# Patient Record
Sex: Male | Born: 1980
Health system: Southern US, Community
[De-identification: ages and names within clinical notes are randomized; demographics above are authoritative.]

## PROBLEM LIST (undated history)

## (undated) DIAGNOSIS — K760 Fatty (change of) liver, not elsewhere classified: Secondary | ICD-10-CM

## (undated) DIAGNOSIS — A481 Legionnaires' disease: Secondary | ICD-10-CM

## (undated) HISTORY — DX: Fatty (change of) liver, not elsewhere classified: K76.0

## (undated) HISTORY — PX: HERNIA REPAIR: SHX51

## (undated) HISTORY — PX: KNEE SURGERY: SHX244

## (undated) HISTORY — DX: Legionnaires' disease: A48.1

## (undated) HISTORY — PX: KNEE ARTHROSCOPY: SUR90

---

## 2003-08-24 ENCOUNTER — Ambulatory Visit (HOSPITAL_COMMUNITY): Admission: RE | Admit: 2003-08-24 | Discharge: 2003-08-24 | Payer: Self-pay | Admitting: Specialist

## 2007-07-08 ENCOUNTER — Ambulatory Visit (HOSPITAL_BASED_OUTPATIENT_CLINIC_OR_DEPARTMENT_OTHER): Admission: RE | Admit: 2007-07-08 | Discharge: 2007-07-08 | Payer: Self-pay | Admitting: General Surgery

## 2008-07-08 ENCOUNTER — Emergency Department (HOSPITAL_COMMUNITY): Admission: EM | Admit: 2008-07-08 | Discharge: 2008-07-09 | Payer: Self-pay | Admitting: Emergency Medicine

## 2008-07-11 ENCOUNTER — Inpatient Hospital Stay (HOSPITAL_COMMUNITY): Admission: EM | Admit: 2008-07-11 | Discharge: 2008-07-16 | Payer: Self-pay | Admitting: Emergency Medicine

## 2009-10-22 ENCOUNTER — Ambulatory Visit (HOSPITAL_COMMUNITY): Admission: RE | Admit: 2009-10-22 | Discharge: 2009-10-22 | Payer: Self-pay | Admitting: General Surgery

## 2010-10-09 LAB — CBC
HCT: 46.8 % (ref 39.0–52.0)
Hemoglobin: 16.2 g/dL (ref 13.0–17.0)
MCV: 94.3 fL (ref 78.0–100.0)
Platelets: 168 10*3/uL (ref 150–400)
RDW: 12.4 % (ref 11.5–15.5)

## 2010-12-03 NOTE — Discharge Summary (Signed)
NAMENICOLA, [name] NO.:  [id number]   MEDICAL RECORD NO.:  [id number]          PATIENT TYPE:  INP   LOCATION:  1513                         FACILITY:  [facility]   PHYSICIAN:  Theodosia Paling, MD    DATE OF BIRTH:  Mar 17, 1981   DATE OF ADMISSION:  07/11/2008  DATE OF DISCHARGE:  07/16/2008                               DISCHARGE SUMMARY   PRIMARY CARE PHYSICIAN:  The patient followed at [facility] in [place].   ADMITTING HISTORY:  Please refer to the excellent admission note  dictated by Dr. Marthann Schiller under the history of present illness.   DISCHARGE DIAGNOSES:  1. Atypical pneumonia.  2. Pancytopenia most likely secondary to pneumonia, likely legionella      or mycoplasma.  3. Transaminitis, most likely secondary to recurring pneumonia, likely      legionella or mycoplasma.  4. Bradycardia, which later on resolved, most likely secondary to #1      again.  5. Diarrhea, most likely secondary to #1 again, which has resolved as      well with treatment.   DISCHARGE MEDICATIONS:  Moxifloxacin 400 mg p.o. daily for 10 days.   HOSPITAL COURSE:  The following issues were addressed during the  hospitalization.  1. Atypical pneumonia.  The patient was initially started on IV      moxifloxacin and IV vancomycin was added later on.  I stopped IV      vancomycin given that he had very bad pneumonia associated with      hypoxia and pancytopenia.  Still, he had relative bradycardia.  An      LDH was ordered, which was elevated.  The patient had GI symptoms      related to pancytopenia, as well as transaminitis.  Legionella      antigen was ordered, pending at this time, potential diagnosis of      atypical pneumonia was made.  The patient received or and IV      vancomycin.  The patient had an excellent response to IV      moxifloxacin completing 14 days course of moxifloxacin and at the      time of discharge he was stable, satting 96% on  room air.  He will      be taking 1 more week at home while he recovers from his illness,      as well as he is hemodynamically stable and his sed rate is      improved.  2. Transaminitis.  The patient had elevation of AST and ALT, which      subsequently was trending down, most likely secondary to atypical      pneumonia likely secondary to legionella pneumonia, viral      serologies were otherwise negative.  Right upper quadrant      ultrasound was ordered, which was negative.  3. Pancytopenia, which again was all through the hospitalization,      again most likely to atypical pneumonia/from legionella pneumonia      or mycoplasma pneumonia.  4. Diarrhea, most likely exacerbated symptoms of legionella pneumonia,  and over the course, improved.   IMAGING PERFORMED:  Chest x-ray done on July 11, 2008 showing patchy  bibasilar airspace disease, compatible with pneumonia.  July 16, 2008 showing increase in bilateral pneumonia.  Ultrasound of the abdomen  on July 15, 2008 showing fatty liver, otherwise negative.   CONSULTATIONS PERFORMED:  None.   PROCEDURES PERFORMED:  None.   DISPOSITION:  The patient is going to follow up with [facility] and he will obtain a followup chest x-ray in 3 weeks and the  chest x-ray has slightly worsened since first chest x-ray, though the  patient has clinically improved.  This could again be compatible with  legionella pneumonia.  The clinical improvement happens and the chest x-  ray gets worse.  The patient is recommended to report back to the  emergency room in case he has any respiratory distress.   Total time spent in discharge of the patient around 1 hour and 15  minutes.      Theodosia Paling, MD  Electronically Signed     NP/MEDQ  D:  07/16/2008  T:  07/16/2008  Job:  [id number]   cc:   [facility]

## 2010-12-03 NOTE — H&P (Signed)
NAMEPHIL, MICHELS NO.:  [id number]   MEDICAL RECORD NO.:  [id number]          PATIENT TYPE:  INP   LOCATION:  0105                         [facility]ACILITY[facility]  [facility]   PHYSICIAN:  Altha Harm, MDDATE OF BIRTH:  1981/01/05   DATE OF ADMISSION:  07/11/2008  DATE OF DISCHARGE:                              HISTORY & PHYSICAL   CHIEF COMPLAINT:  Diagnosed with pneumonia which failed outpatient  treatment.   HISTORY OF PRESENT ILLNESS:  This is a 30 year old gentleman who was  diagnosed with pneumonia approximately 4 days ago.  The patient was  given a prescription for Avelox and Zofran.  He went home and continued  to have nausea and vomiting.  He is not sure how many doses of the  Avelox he was able to keep down.  The patient continued to have general  malaise, weakness, vomiting and a T max of 102.5.  He has had a cough  productive of yellowish sputum and he has had two episodes of diarrhea.  The patient does admit that he had some sick contacts at work, however,  he is not sure if anyone has had the flu.  The patient is usually a very  healthy gentleman with no chronic illnesses.  We are asked to see the  patient today for admission for failed outpatient treatment of community-  acquired pneumonia in addition to dehydration, nausea and vomiting.   PAST MEDICAL HISTORY:  According to the patient, he has no chronic  medical problems and is generally healthy.   FAMILY HISTORY:  Has been reviewed and there are no chronic illnesses  that run in the immediate family.  He does have a grandmother with  hypertension.   SOCIAL HISTORY:  The patient works at a desk job.  He has the support of  his family.  His father is here at the bedside with him.  There is no  tobacco, alcohol or drug use.   CURRENT MEDICATIONS:  1. Avelox 400 mg p.o. daily.  2. Zofran p.r.n.   ALLERGIES:  1. KEFLEX.  2. PENICILLIN.  3. SULFA.   PRIMARY CARE PHYSICIAN:  The patient is  unassigned.   REVIEW OF SYSTEMS:  The patient denies any dizziness, any loss of  consciousness, any seizure activity or headache.  He denies any blurring  of vision.  He denies any eye pain or discharge from his eyes.  He  denies any sinus tenderness or any nasal drip.  He denies any oral pain  or difficulty chewing.  The patient denies any neck pain or difficulty  swallowing.  He denies any chest pain.  In terms of respiratory, please  see the HPI.   He denies any palpitations.  He denies any abdominal pain or  constipation.  Note, the patient does admit to diarrhea.  He denies any  dysuria, frequency or urgency.  He denies any weight loss or weight  gain.  As noted in the HPI, the patient has been having fever and  chills.  He denies any weakness focally.  The patient does admit to  general malaise.  He  denies any back pain, but does admit to general  myalgias.  Otherwise all systems are negative except as noted in the  HPI.   His evaluation done in the emergency room shows a white blood cell count  of 3.2, hemoglobin of 14.6, hematocrit of 41 and platelet count of 139.  Sodium 136, potassium 3.2, chloride 103, bicarb 23, BUN 9, creatinine  0.8.  A chest x-ray shows patchy bilateral infiltrates consistent with  the bilateral pneumonia.   PHYSICAL EXAMINATION:  GENERAL:  The patient in general is toxic-  appearing.  He is laying in bed in distress secondary to toxicity.  VITAL SIGNS:  Temperature is 98.4, heart rate 104, blood pressure  135/87, respiratory rate is 20 with an O2 sat of 96% on 2 liters.  HEENT:  The patient is normocephalic, atraumatic.  Pupils are equally  round and reactive to light and accommodation.  Extraocular movements  are intact.  Oropharynx is dry.  There is no exudate, erythema or  lesions noted.  Nasal mucosa is boggy, no polyps noted.  Tympanic  membranes are translucent bilaterally with good landmarks.  NECK:  Trachea is midline.  No masses, no  thyromegaly, no JVD, no  carotid bruit.  RESPIRATORY:  The patient has normal respiratory effort.  He does have  some mild accessory muscle use.  No wheezing or rhonchi noted.  The  patient has good air entry.  CARDIOVASCULAR:  He is tachycardic.  There is a normal S1-S2.  No  murmurs, rubs or gallops are noted.  PMI is nondisplaced.  No heaves or  thrills on palpation.  ABDOMEN:  The patient has an obese abdomen.  Soft, nontender,  nondistended.  No masses or hepatosplenomegaly.  LYMPH NODES:  No cervical or axillary inguinal lymphadenopathy.  NEUROLOGICAL:  He has got strength of at least 4+/5, although the  patient is generally weak secondary to his illness.  DTRs are 2+  bilaterally in upper and lower extremities.  Sensation is intact to  light touch, pinprick and proprioception.  He has got no focal  neurological deficits and cranial nerves II-XII are grossly intact.  MUSCULOSKELETAL:  He has got no warmth, swelling or erythema around the  joints.  He has no spinal tenderness.  PSYCHIATRIC:  The patient is alert and oriented x3.  He has good recent  and remote recall, good insight and cognition.   ASSESSMENT:  This a patient who presents;  1. Bilateral community-acquired pneumonia.  2. Failed outpatient treatment.  3. Sepsis.  4. Dehydration.  5. Leukopenia.  6. Thrombocytopenia.  7. Hypokalemia.  8. Hypocalcemia.   PLAN:  The patient will be started on aggressive IV hydration, given IV  Avelox.  I will complete his electrolytes and check his blood cultures  in addition to a sputum culture.  The antibiotics will be tapered based  upon the results of cultures.  The patient will be observed closely over  the next 24 hours with vital signs q.4 hours.  Currently, the patient is  hemodynamically stable.  Although he does have features of sepsis, I do  not believe the patient needs ICU  or telemetry monitoring at this time.  The patient will be admitted to a  med-surg bed with  vitals q.4 hours, supplemental oxygen to maintain his  O2 sats greater than 93% and the plan as noted above.  Further decisions  with treatment will be made according to the patient's initial response  to treatment and initial laboratory studies.  Altha Harm, MD  Electronically Signed     MAM/MEDQ  D:  07/11/2008  T:  07/11/2008  Job:  [id number]

## 2010-12-03 NOTE — Op Note (Signed)
NAMEVERBON, GIANGREGORIO              ACCOUNT NO.:  [id number]   MEDICAL RECORD NO.:  [id number]          PATIENT TYPE:  AMB   LOCATION:  DSC                          FACILITY:  [facility]   PHYSICIAN:  Leonie Man, M.D.   DATE OF BIRTH:  April 03, 1981   DATE OF PROCEDURE:  07/08/2007  DATE OF DISCHARGE:                               OPERATIVE REPORT   PREOPERATIVE DIAGNOSIS:  Left inguinal hernia.   POSTOPERATIVE DIAGNOSIS:  Left direct inguinal hernia.   PROCEDURE:  Repair left inguinal hernia using polypropylene mesh.   SURGEON:  Leonie Man, M.D.   ASSISTANT:  OR nurse.   ANESTHESIA:  General.   OPERATIVE FINDINGS:  Left-sided direct inguinal hernia.  No specimens to  lab.  Estimated blood loss was minimal.  No complications noted during  the course of this operation.   INDICATIONS:  The patient is a 30 year old man presenting with a large  left-sided inguinal hernia which was essentially asymptomatic but  presents as a bulge within his groin, on exertion or coughing.  He comes  to the operating room now after the risks and potential benefits of  surgery have been fully discussed.  All questions answered, consent  obtained.   PROCEDURE:  The patient is positioned supinely.  Following into the  induction of satisfactory general anesthesia, the left groin is prepped  and draped to be included in a sterile operative field.  Positive  identification of the patient as Armond Cuthrell is made and a transverse  incision is made in the left lower abdominal crease deepened through  skin and subcutaneous tissue dissecting down to the external oblique  aponeurosis.  The external oblique aponeurosis opened up through the  external inguinal ring with protection of the ilioinguinal nerve which  was retracted laterally and inferiorly.  The spermatic cord is elevated  and held with a Penrose drain.  Dissection within the spermatic cord did  not reveal an indirect hernia.  A large direct  hernia was noted and this  was dissected free and reduced back into the retroperitoneum.  The  hernia was repaired with an onlay patch of polypropylene mesh which was  fashioned from the region of the wound.  This was sewn in at the pubic  tubercle with 2-0 Novofil suture which was carried up along the  conjoined tendon to the internal ring and again from the pubic tubercle,  carried up along the shelving edge of Poupart's ligament to the internal  ring.  The mesh was split and the spermatic cord was brought out through  the leaflets of the split mesh.  The tails of the mesh were sutured down  into the internal oblique muscle behind the cord.  The repair was noted  to be intact.  All areas of dissection checked for hemostasis noted to  be dry.  Sponge and instrument counts are verified and the wounds closed  in layers as follows after additional injections of Marcaine were made  for additional analgesia.  The external oblique aponeurosis was closed  over the cord with running 2-0 Vicryl suture and this reapproximated the  external  ring.  Scarpa fascia was closed with running 3-0 Vicryl suture  and  skin was closed with running subdermal 4-0 Monocryl suture and then  reinforced with Steri-Strips.  Sterile dressings applied.  The  anesthetic reversed and the patient removed from the operating room to  the recovery room in stable condition.  He tolerated the procedure well.      Leonie Man, M.D.  Electronically Signed     PB/MEDQ  D:  07/08/2007  T:  07/08/2007  Job:  [id number]

## 2011-04-25 LAB — BASIC METABOLIC PANEL
BUN: 2 mg/dL — ABNORMAL LOW (ref 6–23)
BUN: 9 mg/dL (ref 6–23)
Calcium: 7.9 mg/dL — ABNORMAL LOW (ref 8.4–10.5)
Creatinine, Ser: 0.83 mg/dL (ref 0.4–1.5)
GFR calc Af Amer: >60 mL/min (ref >60)
GFR calc Af Amer: >60 mL/min (ref >60)
GFR calc non Af Amer: >60 mL/min (ref >60)
GFR calc non Af Amer: >60 mL/min (ref >60)
GFR calc non Af Amer: >60 mL/min (ref >60)
Glucose, Bld: 132 mg/dL — ABNORMAL HIGH (ref 70–99)
Glucose, Bld: 95 mg/dL (ref 70–99)
Potassium: 3.2 mEq/L — ABNORMAL LOW (ref 3.5–5.1)
Potassium: 3.5 mEq/L (ref 3.5–5.1)
Sodium: 139 mEq/L (ref 135–145)
Sodium: 141 mEq/L (ref 135–145)

## 2011-04-25 LAB — CBC
HCT: 40.2 % (ref 39.0–52.0)
HCT: 41.9 % (ref 39.0–52.0)
Hemoglobin: 13.6 g/dL (ref 13.0–17.0)
Hemoglobin: 13.8 g/dL (ref 13.0–17.0)
MCHC: 34.3 g/dL (ref 30.0–36.0)
MCHC: 34.4 g/dL (ref 30.0–36.0)
MCHC: 34.5 g/dL (ref 30.0–36.0)
MCV: 91.2 fL (ref 78.0–100.0)
MCV: 92.3 fL (ref 78.0–100.0)
MCV: 92.6 fL (ref 78.0–100.0)
Platelets: 136 10*3/uL — ABNORMAL LOW (ref 150–400)
Platelets: 139 10*3/uL — ABNORMAL LOW (ref 150–400)
Platelets: 143 10*3/uL — ABNORMAL LOW (ref 150–400)
Platelets: 187 10*3/uL (ref 150–400)
RBC: 4.29 MIL/uL (ref 4.22–5.81)
RBC: 4.41 MIL/uL (ref 4.22–5.81)
RDW: 12.4 % (ref 11.5–15.5)
RDW: 12.9 % (ref 11.5–15.5)
RDW: 13 % (ref 11.5–15.5)
WBC: 3.2 10*3/uL — ABNORMAL LOW (ref 4.0–10.5)
WBC: 4.1 10*3/uL (ref 4.0–10.5)
WBC: 5.6 10*3/uL (ref 4.0–10.5)

## 2011-04-25 LAB — LEGIONELLA ANTIGEN, URINE: Legionella Antigen, Urine: NEGATIVE

## 2011-04-25 LAB — DIFFERENTIAL
Basophils Absolute: 0 10*3/uL (ref 0.0–0.1)
Basophils Absolute: 0 10*3/uL (ref 0.0–0.1)
Basophils Absolute: 0 10*3/uL (ref 0.0–0.1)
Basophils Relative: 0 % (ref 0–1)
Eosinophils Absolute: 0 10*3/uL (ref 0.0–0.7)
Eosinophils Absolute: 0 10*3/uL (ref 0.0–0.7)
Eosinophils Relative: 0 % (ref 0–5)
Eosinophils Relative: 0 % (ref 0–5)
Lymphocytes Relative: 36 % (ref 12–46)
Lymphocytes Relative: 41 % (ref 12–46)
Monocytes Absolute: 0.4 10*3/uL (ref 0.1–1.0)
Monocytes Absolute: 0.5 10*3/uL (ref 0.1–1.0)
Neutro Abs: 1.9 10*3/uL (ref 1.7–7.7)
Neutrophils Relative %: 55 % (ref 43–77)

## 2011-04-25 LAB — COMPREHENSIVE METABOLIC PANEL
ALT: 98 U/L — ABNORMAL HIGH (ref 0–53)
AST: 113 U/L — ABNORMAL HIGH (ref 0–37)
AST: 39 U/L — ABNORMAL HIGH (ref 0–37)
Albumin: 3.1 g/dL — ABNORMAL LOW (ref 3.5–5.2)
CO2: 24 mEq/L (ref 19–32)
CO2: 27 mEq/L (ref 19–32)
Calcium: 8.2 mg/dL — ABNORMAL LOW (ref 8.4–10.5)
Calcium: 8.3 mg/dL — ABNORMAL LOW (ref 8.4–10.5)
Chloride: 109 mEq/L (ref 96–112)
Creatinine, Ser: 0.68 mg/dL (ref 0.4–1.5)
Creatinine, Ser: 0.77 mg/dL (ref 0.4–1.5)
GFR calc Af Amer: >60 mL/min (ref >60)
GFR calc Af Amer: >60 mL/min (ref >60)
GFR calc Af Amer: >60 mL/min (ref >60)
GFR calc non Af Amer: >60 mL/min (ref >60)
GFR calc non Af Amer: >60 mL/min (ref >60)
Glucose, Bld: 112 mg/dL — ABNORMAL HIGH (ref 70–99)
Sodium: 139 mEq/L (ref 135–145)
Total Bilirubin: 1.2 mg/dL (ref 0.3–1.2)

## 2011-04-25 LAB — HEPATITIS PANEL, ACUTE
HCV Ab: NEGATIVE
Hep B C IgM: NEGATIVE

## 2011-04-25 LAB — MAGNESIUM: Magnesium: 2 mg/dL (ref 1.5–2.5)

## 2011-04-25 LAB — PHOSPHORUS: Phosphorus: 1.7 mg/dL — ABNORMAL LOW (ref 2.3–4.6)

## 2011-04-25 LAB — CULTURE, BLOOD (ROUTINE X 2): Culture: NO GROWTH

## 2011-04-25 LAB — CULTURE, RESPIRATORY W GRAM STAIN

## 2011-04-25 LAB — PROTIME-INR: Prothrombin Time: 13.4 seconds (ref 11.6–15.2)

## 2015-05-25 ENCOUNTER — Ambulatory Visit (INDEPENDENT_AMBULATORY_CARE_PROVIDER_SITE_OTHER): Payer: BLUE CROSS/BLUE SHIELD | Admitting: Family Medicine

## 2015-05-25 ENCOUNTER — Encounter: Payer: Self-pay | Admitting: Family Medicine

## 2015-05-25 VITALS — BP 128/80 | HR 68 | Temp 98.4°F | Resp 18 | Ht 74.0 in | Wt 240.0 lb

## 2015-05-25 DIAGNOSIS — Z0000 Encounter for general adult medical examination without abnormal findings: Secondary | ICD-10-CM | POA: Diagnosis not present

## 2015-05-25 NOTE — Progress Notes (Signed)
Subjective:    Patient ID: [name], male    DOB: 15-Aug-1980, 34 y.o.   MRN: [id number]  HPI Patient is in today for complete physical exam. He is scheduled for knee surgery under the care of Dr. Eulah PontMurphy in December. He has been relatively inactive for the last 2 months due to bilateral knee pain. He has gained 20 pounds over the last few months. He is aware of this and he is trying to consume a 1500-calorie a day diet. At the present time he is unable to exercise. Outside of that he has no medical concerns. Past Medical History  Diagnosis Date  . Legionella pneumonia (HCC)     2009   Past Surgical History  Procedure Laterality Date  . Hernia repair     No current outpatient prescriptions on file prior to visit.   No current facility-administered medications on file prior to visit.   Allergies  Allergen Reactions  . Keflex [Cephalexin]   . Penicillins   . Sulfa Antibiotics    Social History   Social History  . Marital Status: Married    Spouse Name: N/A  . Number of Children: N/A  . Years of Education: N/A   Occupational History  . Not on file.   Social History Main Topics  . Smoking status: Never Smoker   . Smokeless tobacco: Never Used  . Alcohol Use: No  . Drug Use: No  . Sexual Activity: Yes     Comment: married, works at [employer]   Other Topics Concern  . Not on file   Social History Narrative  . No narrative on file   Family History  Problem Relation Age of Onset  . Diabetes Father   . Cancer Paternal Grandfather     oral cancer  . Mental illness Mother       Review of Systems  All other systems reviewed and are negative.      Objective:   Physical Exam  Constitutional: He is oriented to person, place, and time. He appears well-developed and well-nourished. No distress.  HENT:  Head: Normocephalic and atraumatic.  Right Ear: External ear normal.  Left Ear: External ear normal.  Nose: Nose normal.  Mouth/Throat: Oropharynx is clear and  moist. No oropharyngeal exudate.  Eyes: Conjunctivae and EOM are normal. Pupils are equal, round, and reactive to light. Right eye exhibits no discharge. Left eye exhibits no discharge. No scleral icterus.  Neck: Normal range of motion. Neck supple. No JVD present. No tracheal deviation present. No thyromegaly present.  Cardiovascular: Normal rate, regular rhythm, normal heart sounds and intact distal pulses.  Exam reveals no gallop and no friction rub.   No murmur heard. Pulmonary/Chest: Effort normal and breath sounds normal. No stridor. No respiratory distress. He has no wheezes. He has no rales. He exhibits no tenderness.  Abdominal: Soft. Bowel sounds are normal. He exhibits no distension and no mass. There is no tenderness. There is no rebound and no guarding. Hernia confirmed negative in the right inguinal area and confirmed negative in the left inguinal area.  Genitourinary: Testes normal and penis normal.  Musculoskeletal: He exhibits no edema.       Right knee: He exhibits decreased range of motion. Tenderness found. Medial joint line and lateral joint line tenderness noted.       Left knee: He exhibits decreased range of motion. Tenderness found. Medial joint line and lateral joint line tenderness noted.  Lymphadenopathy:    He has no cervical adenopathy.  Right: No inguinal adenopathy present.       Left: No inguinal adenopathy present.  Neurological: He is alert and oriented to person, place, and time. He has normal reflexes. He displays normal reflexes. No cranial nerve deficit. He exhibits normal muscle tone. Coordination normal.  Skin: Skin is warm. No rash noted. He is not diaphoretic. No erythema. No pallor.  Psychiatric: He has a normal mood and affect. His behavior is normal. Judgment and thought content normal.  Vitals reviewed.         Assessment & Plan:  Routine general medical examination at a health care facility  I have recommended diet and exercise once his  knees will allow to facilitate weight loss. I would like to see his weight below 220 with an ultimate goal of less than 200 pounds. I will also check a CBC, CMP, fasting lipid panel. His blood pressure is excellent. He had his flu shot at work. The remainder of his physical exam is normal

## 2015-06-29 ENCOUNTER — Telehealth: Payer: Self-pay | Admitting: Family Medicine

## 2015-06-29 NOTE — Telephone Encounter (Signed)
Patient left a message stating that he would like to speak with Dr. Caren MacadamPickard's nurse again regarding his labs. [phone]

## 2015-06-29 NOTE — Telephone Encounter (Signed)
Pt called and questions answered

## 2015-06-29 NOTE — Telephone Encounter (Signed)
WTP reviewed blood work results that were faxed to us from [facility] and Dr. Tanya NonesPickard states that pt has mild elevation in ALT likely due to fatty liver disease. Try to lose about 10-15 pounds, avoid ETOH and tylenol and will recheck in 3 months. Pt is aware.

## 2016-08-18 ENCOUNTER — Ambulatory Visit (HOSPITAL_COMMUNITY)
Admission: EM | Admit: 2016-08-18 | Discharge: 2016-08-18 | Disposition: A | Discharge status: Home / Self Care | Payer: BLUE CROSS/BLUE SHIELD | Attending: Family Medicine | Admitting: Family Medicine

## 2016-08-18 ENCOUNTER — Encounter (HOSPITAL_COMMUNITY): Payer: Self-pay | Admitting: Emergency Medicine

## 2016-08-18 DIAGNOSIS — Z833 Family history of diabetes mellitus: Secondary | ICD-10-CM | POA: Insufficient documentation

## 2016-08-18 DIAGNOSIS — K529 Noninfective gastroenteritis and colitis, unspecified: Secondary | ICD-10-CM | POA: Insufficient documentation

## 2016-08-18 DIAGNOSIS — Z880 Allergy status to penicillin: Secondary | ICD-10-CM | POA: Insufficient documentation

## 2016-08-18 DIAGNOSIS — Z818 Family history of other mental and behavioral disorders: Secondary | ICD-10-CM | POA: Diagnosis not present

## 2016-08-18 DIAGNOSIS — Z881 Allergy status to other antibiotic agents status: Secondary | ICD-10-CM | POA: Insufficient documentation

## 2016-08-18 DIAGNOSIS — Z882 Allergy status to sulfonamides status: Secondary | ICD-10-CM | POA: Insufficient documentation

## 2016-08-18 DIAGNOSIS — R109 Unspecified abdominal pain: Secondary | ICD-10-CM | POA: Diagnosis present

## 2016-08-18 MED ORDER — SODIUM CHLORIDE 0.9 % IV BOLUS (SEPSIS)
1000.0000 mL | Freq: Once | INTRAVENOUS | Status: AC
  Administered 2016-08-18: 1000 mL via INTRAVENOUS

## 2016-08-18 MED ORDER — ONDANSETRON 8 MG PO TBDP: 8.0000 mg | ORAL_TABLET | Freq: Three times a day (TID) | ORAL | 0 refills | Status: DC | PRN

## 2016-08-18 MED ORDER — ONDANSETRON 4 MG PO TBDP
ORAL_TABLET | ORAL | Status: AC
  Filled 2016-08-18: qty 1

## 2016-08-18 MED ORDER — ONDANSETRON 4 MG PO TBDP
4.0000 mg | ORAL_TABLET | Freq: Once | ORAL | Status: AC
  Administered 2016-08-18: 4 mg via ORAL

## 2016-08-18 NOTE — ED Notes (Signed)
Bed: UC08 Expected date:  Expected time:  Means of arrival:  Comments: 

## 2016-08-18 NOTE — ED Triage Notes (Signed)
[facility] a [facility] of abdominal cramps with N/V/D that started after eating a frozen pizza 2 days ago.

## 2016-08-18 NOTE — ED Provider Notes (Addendum)
[facility]    CSN: [id number] Arrival date & time: 08/18/16  1420     History   Chief Complaint Chief Complaint  Patient presents with  . Abdominal Cramping    HPI [name] is a 36 y.o. male.   This is a 36 year old man who [facility] for evaluation of nausea, vomiting, and diarrhea. He thinks this may be related to eating a frozen pizza 2 days ago.  Patient began with nausea and vomiting early Saturday morning about 36 hours ago. He's continued to have intermittent nausea and vomiting and diarrhea. Was recently he vomited about 3 hours ago and he thought there was some blood in his emesis. He's had no blood in his diarrhea, last episode being about 3:30.  Patient's having some cramps but no focal continuous pain.      Past Medical History:  Diagnosis Date  . 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   Past Surgical History:  Procedure Laterality Date  . HERNIA REPAIR         Home Medications    Prior to Admission medications   Medication Sig Start Date End Date Taking? Authorizing Provider  ondansetron (ZOFRAN-ODT) 8 MG disintegrating tablet Take 1 tablet (8 mg total) by mouth every 8 (eight) hours as needed for nausea. 08/18/16   Elvina SidleKurt Beatric Fulop, MD    Family History Family History  Problem Relation Age of Onset  . Diabetes Father   . Cancer Paternal Grandfather     oral cancer  . Mental illness Mother     Social History Social History  Substance Use Topics  . Smoking status: Never Smoker  . Smokeless tobacco: Never Used  . Alcohol use No     Allergies   Keflex [cephalexin]; Penicillins; and Sulfa antibiotics   Review of Systems Review of Systems  Constitutional: Positive for fatigue.  HENT: Negative.   Gastrointestinal: Positive for abdominal pain, diarrhea, nausea and vomiting.  Genitourinary: Negative.     Musculoskeletal: Negative.   Neurological: Negative.      Physical Exam Triage Vital Signs ED Triage Vitals  Enc Vitals Group     BP 08/18/16 1446 123/74     Pulse Rate 08/18/16 1446 (!) 132     Resp 08/18/16 1446 18     Temp 08/18/16 1446 98.8 F (37.1 C)     Temp Source 08/18/16 1446 Oral     SpO2 08/18/16 1446 98 %     Weight --      Height --      Head Circumference --      Peak Flow --      Pain Score 08/18/16 1445 7     Pain Loc --      Pain Edu? --      Excl. in GC? --    Orthostatic VS for the past 24 hrs:  BP- Lying Pulse- Lying BP- Sitting Pulse- Sitting BP- Standing at 0 minutes Pulse- Standing at 0 minutes  08/18/16 1543 131/77 122 106/82 131 107/81 136    Updated Vital Signs BP 123/74 (BP Location: Right Arm)   Pulse (!) 132   Temp 98.8 F (37.1 C) (Oral)   Resp 18   SpO2 98%    Physical Exam  Constitutional: He is oriented to person, place, and time. He appears well-developed and well-nourished.  HENT:  Head: Normocephalic.  Right  Ear: External ear normal.  Left Ear: External ear normal.  Mucous membranes are dry  Eyes: Conjunctivae and EOM are normal. Pupils are equal, round, and reactive to light.  Neck: Normal range of motion. Neck supple. No JVD present.  Cardiovascular: Regular rhythm and normal heart sounds.   Rapid heart rate while lying down at about 1 10 bpm  Pulmonary/Chest: Effort normal and breath sounds normal.  Abdominal: Soft.  Hyperactive bowel sounds Mild tenderness with deep palpation in both lower quadrants  Musculoskeletal: Normal range of motion.  Neurological: He is alert and oriented to person, place, and time.  Skin: Skin is warm and dry.  Nursing note and vitals reviewed.    UC Treatments / Results  Labs (all labs ordered are listed, but only abnormal results are displayed) Labs Reviewed - No data to display  EKG  EKG Interpretation None       Radiology No results found.  Procedures Procedures  (including critical care time)  Medications Ordered in UC Medications  sodium chloride 0.9 % bolus 1,000 mL (1,000 mLs Intravenous Given 08/18/16 1550)  ondansetron (ZOFRAN-ODT) disintegrating tablet 4 mg (4 mg Oral Given 08/18/16 1542)     Initial Impression / Assessment and Plan / UC Course  I have reviewed the triage vital signs and the nursing notes.  Pertinent labs & imaging results that were available during my care of the patient were reviewed by me and considered in my medical decision making (see chart for details).     Final Clinical Impressions(s) / UC Diagnoses   Final diagnoses:  Acute gastroenteritis    New Prescriptions New Prescriptions   ONDANSETRON (ZOFRAN-ODT) 8 MG DISINTEGRATING TABLET    Take 1 tablet (8 mg total) by mouth every 8 (eight) hours as needed for nausea.     Elvina Sidle, MD 08/18/16 1610    Elvina Sidle, MD 08/18/16 1620

## 2016-08-18 NOTE — Discharge Instructions (Signed)
Continue with sips of clear liquids starting around 7:00 tonight. Do not attempt solids until you've kept liquids down for at least several hours.

## 2016-08-18 NOTE — ED Notes (Signed)
1000cc Sodium Chloride 0.9% bolus completed.

## 2016-08-19 ENCOUNTER — Encounter: Payer: Self-pay | Admitting: Family Medicine

## 2016-08-19 ENCOUNTER — Ambulatory Visit (INDEPENDENT_AMBULATORY_CARE_PROVIDER_SITE_OTHER): Payer: BLUE CROSS/BLUE SHIELD | Admitting: Family Medicine

## 2016-08-19 ENCOUNTER — Ambulatory Visit: Payer: BLUE CROSS/BLUE SHIELD | Admitting: Family Medicine

## 2016-08-19 VITALS — BP 120/76 | HR 90 | Temp 98.9°F | Resp 18 | Ht 74.0 in | Wt 235.0 lb

## 2016-08-19 DIAGNOSIS — R8299 Other abnormal findings in urine: Secondary | ICD-10-CM | POA: Diagnosis not present

## 2016-08-19 DIAGNOSIS — K529 Noninfective gastroenteritis and colitis, unspecified: Secondary | ICD-10-CM

## 2016-08-19 DIAGNOSIS — R82998 Other abnormal findings in urine: Secondary | ICD-10-CM

## 2016-08-19 LAB — URINALYSIS, ROUTINE W REFLEX MICROSCOPIC
BILIRUBIN URINE: NEGATIVE
Glucose, UA: NEGATIVE
Ketones, ur: NEGATIVE
Leukocytes, UA: NEGATIVE
Nitrite: NEGATIVE
SPECIFIC GRAVITY, URINE: 1.02 (ref 1.001–1.035)
pH: 6 (ref 5.0–8.0)

## 2016-08-19 LAB — CBC
HCT: 45.8 % (ref 38.5–50.0)
HEMOGLOBIN: 16 g/dL (ref 13.0–17.0)
MCH: 32.3 pg (ref 27.0–33.0)
MCHC: 34.9 g/dL (ref 32.0–36.0)
MCV: 92.5 fL (ref 80.0–100.0)
Platelets: 196 10*3/uL (ref 140–400)
RBC: 4.95 MIL/uL (ref 4.20–5.80)
RDW: 13.1 % (ref 11.0–15.0)
WBC: 10.5 10*3/uL (ref 3.8–10.8)

## 2016-08-19 LAB — COMPREHENSIVE METABOLIC PANEL
ALK PHOS: 57 U/L (ref 40–115)
ALT: 16 U/L (ref 9–46)
AST: 16 U/L (ref 10–40)
Albumin: 3.8 g/dL (ref 3.6–5.1)
BUN: 30 mg/dL — ABNORMAL HIGH (ref 7–25)
CALCIUM: 9.4 mg/dL (ref 8.6–10.3)
CHLORIDE: 100 mmol/L (ref 98–110)
CO2: 23 mmol/L (ref 20–31)
Creat: 1.03 mg/dL (ref 0.60–1.35)
Glucose, Bld: 135 mg/dL — ABNORMAL HIGH (ref 70–99)
POTASSIUM: 3.8 mmol/L (ref 3.5–5.3)
Sodium: 139 mmol/L (ref 135–146)
Total Bilirubin: 2.4 mg/dL — ABNORMAL HIGH (ref 0.2–1.2)
Total Protein: 6.5 g/dL (ref 6.1–8.1)

## 2016-08-19 LAB — URINALYSIS, MICROSCOPIC ONLY
CRYSTALS: NONE SEEN [HPF]
Casts: NONE SEEN [LPF]
Squamous Epithelial / LPF: NONE SEEN [HPF] (ref <=5)
Yeast: NONE SEEN [HPF]

## 2016-08-19 MED ORDER — SUCRALFATE 1 GM/10ML PO SUSP: 1.0000 g | Freq: Two times a day (BID) | ORAL | 0 refills | Status: DC

## 2016-08-19 MED ORDER — PROMETHAZINE HCL 25 MG/ML IJ SOLN
25.0000 mg | Freq: Once | INTRAMUSCULAR | Status: AC
  Administered 2016-08-19: 25 mg via INTRAVENOUS

## 2016-08-19 NOTE — Progress Notes (Signed)
   Subjective:    Patient ID: [name], male    DOB: 08/12/1980, 36 y.o.   MRN: [id number]  Patient presents for Illness (x3 days- GI issues- N/V/D- [facility]  Pt here with Nausea vomiting diarrhea for the past 4 days. He was seen at the urgent care yesterday. His symptoms started after eating pizza and chicken nuggets but he also had exposure to a stomach bug at his office. He has not had any fever. He states that vomiting and cramps was violent with the first 24 hours. He went to the urgent care yesterday they gave him a 1 L bolus of fluid as he was significantly dehydrated he was also given Zofran. He has been taking the Zofran which helped some but he still belches significantly and feels acid coming up causing him to have nausea and vomiting. He's also still having diarrhea. He has not noted any blood in his school. He has not had any fever no cough or congestion no 1 else in his household is sick. He does not take any medications on a regular basis. He is actually accompanied today by his mother-in-law.  He also states his urine has been a very dark brown, almost syrup color and thick?  Review Of Systems:  GEN- denies fatigue, fever, weight loss,weakness, recent illness HEENT- denies eye drainage, change in vision, nasal discharge, CVS- denies chest pain, palpitations RESP- denies SOB, cough, wheeze ABD- + N/V, change in stools, +abd pain GU- denies dysuria, hematuria, dribbling, incontinence MSK- denies joint pain, muscle aches, injury Neuro- denies headache, dizziness, syncope, seizure activity       Objective:    BP 120/76 (BP Location: Right Arm, Patient Position: Sitting, Cuff Size: Large)   Pulse 90   Temp 98.9 F (37.2 C) (Oral)   Resp 18   Ht 6\' 2"  (1.88 m)   Wt 235 lb (106.6 kg)   SpO2 95%   BMI 30.17 kg/m  GEN- NAD, alert and oriented x3, ill appearing, belching a lot  HEENT- PERRL, EOMI, non injected sclera, pink conjunctiva, MM a little dry ,  oropharynx clear Neck- Supple, no LAD  CVS- RRR, no murmur RESP-CTAB ABD-NABS,soft,hypoactive BS throughout, Mild TTP entire abdomen, no guarding no rebound, no CVA tenderness  EXT- No edema Skin- normal  Pulses- Radial - 2+  1 L bolus given, phenergan 25mg  Injection        Assessment & Plan:      Problem List Items Addressed This Visit    None    Visit Diagnoses    Gastroenteritis    -  Primary   Improved with fluids. Given carafate to coat stomach and help bleching reflux. Can use immodium for diarrhea.    Relevant Medications   promethazine (PHENERGAN) injection 25 mg (Completed) (Start on 08/19/2016  2:00 PM)   Other Relevant Orders   CBC (Completed)   Comprehensive metabolic panel   Lipase   Dark brown urine       CMET, LIpase pending, UA    Relevant Orders   Urinalysis, Routine w reflex microscopic      Note: This dictation was prepared with Dragon dictation along with smaller phrase technology. Any transcriptional errors that result from this process are unintentional.

## 2016-08-19 NOTE — Patient Instructions (Signed)
Take the carafate to coat your stomach Continue the zofran BRAT diet  F/U as needed or if not improving

## 2016-08-20 ENCOUNTER — Observation Stay (HOSPITAL_COMMUNITY): Payer: BLUE CROSS/BLUE SHIELD

## 2016-08-20 ENCOUNTER — Telehealth: Payer: Self-pay | Admitting: *Deleted

## 2016-08-20 ENCOUNTER — Emergency Department (HOSPITAL_COMMUNITY): Payer: BLUE CROSS/BLUE SHIELD

## 2016-08-20 ENCOUNTER — Encounter (HOSPITAL_COMMUNITY): Payer: Self-pay

## 2016-08-20 ENCOUNTER — Inpatient Hospital Stay (HOSPITAL_COMMUNITY)
Admission: EM | Admit: 2016-08-20 | Discharge: 2016-08-27 | DRG: 388 | Disposition: A | Discharge status: Home / Self Care | Payer: BLUE CROSS/BLUE SHIELD | Attending: Surgery | Admitting: Surgery

## 2016-08-20 DIAGNOSIS — K631 Perforation of intestine (nontraumatic): Secondary | ICD-10-CM

## 2016-08-20 DIAGNOSIS — R17 Unspecified jaundice: Secondary | ICD-10-CM | POA: Diagnosis present

## 2016-08-20 DIAGNOSIS — K56609 Unspecified intestinal obstruction, unspecified as to partial versus complete obstruction: Secondary | ICD-10-CM | POA: Diagnosis present

## 2016-08-20 DIAGNOSIS — Z882 Allergy status to sulfonamides status: Secondary | ICD-10-CM

## 2016-08-20 DIAGNOSIS — K567 Ileus, unspecified: Principal | ICD-10-CM | POA: Diagnosis present

## 2016-08-20 DIAGNOSIS — R198 Other specified symptoms and signs involving the digestive system and abdomen: Secondary | ICD-10-CM

## 2016-08-20 DIAGNOSIS — R188 Other ascites: Secondary | ICD-10-CM

## 2016-08-20 DIAGNOSIS — R1084 Generalized abdominal pain: Secondary | ICD-10-CM | POA: Diagnosis not present

## 2016-08-20 DIAGNOSIS — B9561 Methicillin susceptible Staphylococcus aureus infection as the cause of diseases classified elsewhere: Secondary | ICD-10-CM | POA: Diagnosis present

## 2016-08-20 DIAGNOSIS — K651 Peritoneal abscess: Secondary | ICD-10-CM | POA: Diagnosis present

## 2016-08-20 DIAGNOSIS — IMO0002 Reserved for concepts with insufficient information to code with codable children: Secondary | ICD-10-CM

## 2016-08-20 DIAGNOSIS — Z881 Allergy status to other antibiotic agents status: Secondary | ICD-10-CM

## 2016-08-20 DIAGNOSIS — Z0189 Encounter for other specified special examinations: Secondary | ICD-10-CM

## 2016-08-20 DIAGNOSIS — B952 Enterococcus as the cause of diseases classified elsewhere: Secondary | ICD-10-CM | POA: Diagnosis present

## 2016-08-20 DIAGNOSIS — B9620 Unspecified Escherichia coli [E. coli] as the cause of diseases classified elsewhere: Secondary | ICD-10-CM | POA: Diagnosis present

## 2016-08-20 DIAGNOSIS — E876 Hypokalemia: Secondary | ICD-10-CM | POA: Diagnosis present

## 2016-08-20 DIAGNOSIS — Z880 Allergy status to penicillin: Secondary | ICD-10-CM

## 2016-08-20 LAB — URINALYSIS, ROUTINE W REFLEX MICROSCOPIC
BACTERIA UA: NONE SEEN
BILIRUBIN URINE: NEGATIVE
Glucose, UA: NEGATIVE mg/dL
HGB URINE DIPSTICK: NEGATIVE
Ketones, ur: 20 mg/dL — AB
LEUKOCYTES UA: NEGATIVE
NITRITE: NEGATIVE
PH: 6 (ref 5.0–8.0)
Protein, ur: 30 mg/dL — AB
SPECIFIC GRAVITY, URINE: 1.024 (ref 1.005–1.030)
SQUAMOUS EPITHELIAL / LPF: NONE SEEN

## 2016-08-20 LAB — CBC
HCT: 44.7 % (ref 39.0–52.0)
HEMATOCRIT: 42.9 % (ref 39.0–52.0)
Hemoglobin: 16.1 g/dL (ref 13.0–17.0)
Hemoglobin: 15 g/dL (ref 13.0–17.0)
MCH: 32.1 pg (ref 26.0–34.0)
MCH: 31.6 pg (ref 26.0–34.0)
MCHC: 36 g/dL (ref 30.0–36.0)
MCHC: 35 g/dL (ref 30.0–36.0)
MCV: 89.2 fL (ref 78.0–100.0)
MCV: 90.5 fL (ref 78.0–100.0)
PLATELETS: 251 10*3/uL (ref 150–400)
PLATELETS: 219 10*3/uL (ref 150–400)
RBC: 5.01 MIL/uL (ref 4.22–5.81)
RBC: 4.74 MIL/uL (ref 4.22–5.81)
RDW: 12.7 % (ref 11.5–15.5)
RDW: 12.7 % (ref 11.5–15.5)
WBC: 10 10*3/uL (ref 4.0–10.5)
WBC: 8.5 10*3/uL (ref 4.0–10.5)

## 2016-08-20 LAB — COMPREHENSIVE METABOLIC PANEL
ALT: 21 U/L (ref 17–63)
AST: 19 U/L (ref 15–41)
Albumin: 3.9 g/dL (ref 3.5–5.0)
Alkaline Phosphatase: 48 U/L (ref 38–126)
Anion gap: 12 (ref 5–15)
BILIRUBIN TOTAL: 2.5 mg/dL — AB (ref 0.3–1.2)
BUN: 28 mg/dL — ABNORMAL HIGH (ref 6–20)
CO2: 25 mmol/L (ref 22–32)
CREATININE: 0.86 mg/dL (ref 0.61–1.24)
Calcium: 9.6 mg/dL (ref 8.9–10.3)
Chloride: 100 mmol/L — ABNORMAL LOW (ref 101–111)
GFR calc Af Amer: >60 mL/min (ref >60)
Glucose, Bld: 114 mg/dL — ABNORMAL HIGH (ref 65–99)
Potassium: 3.3 mmol/L — ABNORMAL LOW (ref 3.5–5.1)
Sodium: 137 mmol/L (ref 135–145)
TOTAL PROTEIN: 7.4 g/dL (ref 6.5–8.1)

## 2016-08-20 LAB — CREATININE, SERUM
Creatinine, Ser: 0.88 mg/dL (ref 0.61–1.24)
GFR calc non Af Amer: >60 mL/min (ref >60)

## 2016-08-20 LAB — LIPASE, BLOOD: Lipase: 13 U/L (ref 11–51)

## 2016-08-20 LAB — LIPASE: Lipase: 6 U/L — ABNORMAL LOW (ref 7–60)

## 2016-08-20 MED ORDER — SODIUM CHLORIDE 0.9 % IV SOLN
1.0000 g | Freq: Three times a day (TID) | INTRAVENOUS | Status: DC
  Administered 2016-08-20 – 2016-08-27 (×21): 1 g via INTRAVENOUS
  Filled 2016-08-20 (×22): qty 1

## 2016-08-20 MED ORDER — LACTATED RINGERS IV SOLN
INTRAVENOUS | Status: DC
  Administered 2016-08-20 – 2016-08-22 (×6): via INTRAVENOUS
  Administered 2016-08-22: 1000 mL via INTRAVENOUS
  Administered 2016-08-23 – 2016-08-26 (×8): via INTRAVENOUS
  Administered 2016-08-26 – 2016-08-27 (×2): 1000 mL via INTRAVENOUS

## 2016-08-20 MED ORDER — LIP MEDEX EX OINT
TOPICAL_OINTMENT | CUTANEOUS | Status: AC
  Filled 2016-08-20: qty 7

## 2016-08-20 MED ORDER — IOPAMIDOL (ISOVUE-300) INJECTION 61%
100.0000 mL | Freq: Once | INTRAVENOUS | Status: AC | PRN
  Administered 2016-08-20: 100 mL via INTRAVENOUS

## 2016-08-20 MED ORDER — PHENOL 1.4 % MT LIQD
2.0000 | OROMUCOSAL | Status: DC | PRN
  Filled 2016-08-20: qty 177

## 2016-08-20 MED ORDER — ALUM & MAG HYDROXIDE-SIMETH 200-200-20 MG/5ML PO SUSP: 30.0000 mL | Freq: Four times a day (QID) | ORAL | Status: DC | PRN

## 2016-08-20 MED ORDER — ONDANSETRON 4 MG PO TBDP: 4.0000 mg | ORAL_TABLET | Freq: Four times a day (QID) | ORAL | Status: DC | PRN

## 2016-08-20 MED ORDER — MAGIC MOUTHWASH: 15.0000 mL | Freq: Four times a day (QID) | ORAL | Status: DC | PRN

## 2016-08-20 MED ORDER — LACTATED RINGERS IV BOLUS (SEPSIS)
1000.0000 mL | Freq: Once | INTRAVENOUS | Status: DC
Start: 2016-08-20 — End: 2016-08-27

## 2016-08-20 MED ORDER — ONDANSETRON 4 MG PO TBDP
4.0000 mg | ORAL_TABLET | Freq: Once | ORAL | Status: AC
  Administered 2016-08-20: 4 mg via ORAL
  Filled 2016-08-20: qty 1

## 2016-08-20 MED ORDER — SODIUM CHLORIDE 0.9 % IV BOLUS (SEPSIS)
1000.0000 mL | Freq: Once | INTRAVENOUS | Status: AC
  Administered 2016-08-20: 1000 mL via INTRAVENOUS

## 2016-08-20 MED ORDER — PROCHLORPERAZINE EDISYLATE 5 MG/ML IJ SOLN: 5.0000 mg | Freq: Four times a day (QID) | INTRAMUSCULAR | Status: DC | PRN

## 2016-08-20 MED ORDER — ONDANSETRON HCL 4 MG/2ML IJ SOLN: 4.0000 mg | Freq: Four times a day (QID) | INTRAMUSCULAR | Status: DC | PRN

## 2016-08-20 MED ORDER — LACTATED RINGERS IV BOLUS (SEPSIS): 1000.0000 mL | Freq: Three times a day (TID) | INTRAVENOUS | Status: AC | PRN

## 2016-08-20 MED ORDER — IOPAMIDOL (ISOVUE-300) INJECTION 61%
INTRAVENOUS | Status: AC
  Administered 2016-08-20: 100 mL via INTRAVENOUS
  Filled 2016-08-20: qty 100

## 2016-08-20 MED ORDER — METOCLOPRAMIDE HCL 5 MG/ML IJ SOLN: 5.0000 mg | Freq: Four times a day (QID) | INTRAMUSCULAR | Status: DC | PRN

## 2016-08-20 MED ORDER — MENTHOL 3 MG MT LOZG
1.0000 | LOZENGE | OROMUCOSAL | Status: DC | PRN
  Filled 2016-08-20: qty 9

## 2016-08-20 MED ORDER — SODIUM CHLORIDE 0.45 % IV SOLN
INTRAVENOUS | Status: DC
  Administered 2016-08-20: 14:00:00 via INTRAVENOUS
  Filled 2016-08-20 (×2): qty 1000

## 2016-08-20 MED ORDER — HEPARIN SODIUM (PORCINE) 5000 UNIT/ML IJ SOLN: 5000.0000 [IU] | Freq: Three times a day (TID) | INTRAMUSCULAR | Status: DC

## 2016-08-20 MED ORDER — PANTOPRAZOLE SODIUM 40 MG IV SOLR
40.0000 mg | Freq: Two times a day (BID) | INTRAVENOUS | Status: DC
  Administered 2016-08-20 – 2016-08-27 (×15): 40 mg via INTRAVENOUS
  Filled 2016-08-20 (×15): qty 40

## 2016-08-20 MED ORDER — SODIUM CHLORIDE 0.9 % IV SOLN
8.0000 mg | Freq: Four times a day (QID) | INTRAVENOUS | Status: DC | PRN
  Filled 2016-08-20: qty 4

## 2016-08-20 MED ORDER — DIPHENHYDRAMINE HCL 25 MG PO CAPS: 25.0000 mg | ORAL_CAPSULE | Freq: Four times a day (QID) | ORAL | Status: DC | PRN

## 2016-08-20 MED ORDER — LIP MEDEX EX OINT
1.0000 "application " | TOPICAL_OINTMENT | Freq: Two times a day (BID) | CUTANEOUS | Status: DC
  Administered 2016-08-20 – 2016-08-27 (×11): 1 via TOPICAL
  Filled 2016-08-20 (×2): qty 7

## 2016-08-20 MED ORDER — DIPHENHYDRAMINE HCL 50 MG/ML IJ SOLN: 25.0000 mg | Freq: Four times a day (QID) | INTRAMUSCULAR | Status: DC | PRN

## 2016-08-20 MED ORDER — MORPHINE SULFATE (PF) 2 MG/ML IV SOLN
1.0000 mg | INTRAVENOUS | Status: DC | PRN
  Administered 2016-08-21 – 2016-08-22 (×4): 1 mg via INTRAVENOUS
  Filled 2016-08-20 (×4): qty 1

## 2016-08-20 MED ORDER — METHOCARBAMOL 1000 MG/10ML IJ SOLN
1000.0000 mg | Freq: Four times a day (QID) | INTRAVENOUS | Status: DC | PRN
  Filled 2016-08-20: qty 10

## 2016-08-20 MED ORDER — ENOXAPARIN SODIUM 40 MG/0.4ML ~~LOC~~ SOLN
40.0000 mg | SUBCUTANEOUS | Status: DC
  Administered 2016-08-20 – 2016-08-22 (×3): 40 mg via SUBCUTANEOUS
  Filled 2016-08-20 (×4): qty 0.4

## 2016-08-20 NOTE — ED Provider Notes (Signed)
[facility]-EMERGENCY DEPT Provider Note   CSN: [id number] Arrival date & time: 08/20/16  1478     History   Chief Complaint Chief Complaint  Patient presents with  . Emesis  . Diarrhea  . Abdominal Pain    HPI [name] is a 36 y.o. male.  Patient is a 36 year old male with no significant past medical history. He presents for evaluation of nausea, vomiting, diarrhea, and abdominal cramping that has been ongoing the past 4 days. The patient reports he has had little nourishment during this period of time. All of his vomiting and diarrhea have been non-bloody. He reports generalized abdominal cramping. He has been seen in urgent care and given fluids, however his symptoms persist. He denies any fevers or chills. He denies any definite ill contacts.   The history is provided by the patient and a relative.  Emesis   This is a new problem. Episode onset: 4 days ago. The problem occurs continuously. The problem has not changed since onset.The emesis has an appearance of stomach contents. There has been no fever.    Past Medical History:  Diagnosis Date  . 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   Past Surgical History:  Procedure Laterality Date  . HERNIA REPAIR    . KNEE SURGERY Left        Home Medications    Prior to Admission medications   Medication Sig Start Date End Date Taking? Authorizing Provider  ondansetron (ZOFRAN-ODT) 8 MG disintegrating tablet Take 1 tablet (8 mg total) by mouth every 8 (eight) hours as needed for nausea. 08/18/16   Elvina Sidle, MD  sucralfate (CARAFATE) 1 GM/10ML suspension Take 10 mLs (1 g total) by mouth 2 (two) times daily. 08/19/16   Salley Scarlet, MD    Family History Family History  Problem Relation Age of Onset  . Diabetes Father   . Cancer Paternal Grandfather     oral cancer  . Mental illness Mother     Social History Social History  Substance Use Topics  .  Smoking status: Never Smoker  . Smokeless tobacco: Never Used  . Alcohol use No     Allergies   Keflex [cephalexin]; Penicillins; and Sulfa antibiotics   Review of Systems Review of Systems  All other systems reviewed and are negative.    Physical Exam Updated Vital Signs BP 131/93 (BP Location: Left Arm)   Pulse 104   Temp 98.4 F (36.9 C) (Oral)   Resp 14   Ht 6\' 2"  (1.88 m)   Wt 235 lb (106.6 kg)   SpO2 95%   BMI 30.17 kg/m   Physical Exam  Constitutional: He is oriented to person, place, and time. He appears well-developed and well-nourished. No distress.  HENT:  Head: Normocephalic and atraumatic.  Mouth/Throat: Oropharynx is clear and moist.  Neck: Normal range of motion. Neck supple.  Cardiovascular: Normal rate and regular rhythm.  Exam reveals no friction rub.   No murmur heard. Pulmonary/Chest: Effort normal and breath sounds normal. No respiratory distress. He has no wheezes. He has no rales.  Abdominal: Soft. Bowel sounds are normal. He exhibits no distension. There is tenderness. There is no rebound and no guarding.  There is generalized abdominal tenderness.  Musculoskeletal: Normal range of motion. He exhibits no edema.  Neurological: He is alert and oriented to person, place, and time. Coordination normal.  Skin: Skin is warm and dry. He  is not diaphoretic.  Nursing note and vitals reviewed.    ED Treatments / Results  Labs (all labs ordered are listed, but only abnormal results are displayed) Labs Reviewed  COMPREHENSIVE METABOLIC PANEL - Abnormal; Notable for the following:       Result Value   Potassium 3.3 (*)    Chloride 100 (*)    Glucose, Bld 114 (*)    BUN 28 (*)    Total Bilirubin 2.5 (*)    All other components within normal limits  LIPASE, BLOOD  CBC  URINALYSIS, ROUTINE W REFLEX MICROSCOPIC    EKG  EKG Interpretation None       Radiology No results found.  Procedures Procedures (including critical care  time)  Medications Ordered in ED Medications  ondansetron (ZOFRAN-ODT) disintegrating tablet 4 mg (not administered)  sodium chloride 0.9 % bolus 1,000 mL (not administered)  sodium chloride 0.9 % bolus 1,000 mL (not administered)     Initial Impression / Assessment and Plan / ED Course  I have reviewed the triage vital signs and the nursing notes.  Pertinent labs & imaging results that were available during my care of the patient were reviewed by me and considered in my medical decision making (see chart for details).     Patient presents here with a four-day history of nausea, vomiting, diarrhea, and abdominal cramping. He reports minimal by mouth intake during this period of time. He was seen at outside facilities and given fluids and nausea medicine, however nothing is helped.  Due to the prolonged nature of his symptoms, a CT scan of the abdomen was obtained today which revealed a small bowel obstruction and intraperitoneal free air. This finding was discussed with general surgery and the patient will be admitted to their service for further workup and possible surgical intervention.  Final Clinical Impressions(s) / ED Diagnoses   Final diagnoses:  None    New Prescriptions New Prescriptions   No medications on file     Geoffery Lyonsouglas Paige Monarrez, MD 08/20/16 [id number]

## 2016-08-20 NOTE — H&P (Signed)
[facility] Admission Note  Garvin Ellena 17-Dec-1980  [id number].    Requesting MD: Stark Jock Chief Complaint/Reason for Consult: Emesis  HPI:  [name] is a 36yo male who presented to [facility] earlier today with nausea, vomiting, and diarrhea that began on Saturday 08/16/16 after eating pizza and chicken wings. Initially thought that he had food poisoning, but was concerned when he was unable to tolerate anything PO for multiple days. Went to PCP Monday where he received IVF and Reglan. Temporarily improved, but due to persistent n/v today he decided to proceed to the ED. Pain is global about the abdomen and crampy in nature. It is constant but worse with trying to eat. States that he feels bloated. Initially started with diarrhea, but today he has not had a BM and he is not passing flatus. Denies any fever, chills, dysuria, hematemesis, hematochezia, or melena. States that he has never had pain like this before.  ED workup: - CT scan shows small bowel obstruction with free air and free fluid in the abdomen and pelvis compatible with bowel perforation, exact source/ site not visualized; colonic diverticulosis no active diverticulitis - WBC 10, afebrile, VS WNL  No significant PMH Past surgical history includes bilateral inguinal hernia repairs Denies home use of anticoagulants Nonsmoker Employment: works at [employer], recently promoted  ROS: Review of Systems  Constitutional: Negative for chills and fever.  HENT: Negative.   Eyes: Negative.   Respiratory: Negative.   Cardiovascular: Negative.   Gastrointestinal: Positive for abdominal pain, diarrhea, nausea and vomiting. Negative for blood in stool and melena.  Genitourinary: Negative.   Skin: Negative.   Neurological: Positive for weakness.   All systems reviewed and otherwise negative except for as above  Family History  Problem Relation Age of Onset  . Diabetes Father   . Cancer Paternal Grandfather     oral cancer   . Mental illness Mother     Past Medical History:  Diagnosis Date  . Legionella pneumonia ([place])    2009    Past Surgical History:  Procedure Laterality Date  . HERNIA REPAIR    . KNEE SURGERY Left     Social History:  reports that he has never smoked. He has never used smokeless tobacco. He reports that he does not drink alcohol or use drugs.  Allergies:  Allergies  Allergen Reactions  . Keflex [Cephalexin] Anaphylaxis and Rash  . Penicillins Other (See Comments)    Childhood Has patient had a PCN reaction causing immediate rash, facial/tongue/throat swelling, SOB or lightheadedness with hypotension: uknown Has patient had a PCN reaction causing severe rash involving mucus membranes or skin necrosis: unknown Has patient had a PCN reaction that required hospitalization unknown Has patient had a PCN reaction occurring within the last 10 years: uknown If all of the above answers are "NO", then may proceed with Cephalosporin use.   . Sulfa Antibiotics Other (See Comments)    Childhood Allergy     (Not in a hospital admission)  Prior to Admission medications   Medication Sig Start Date End Date Taking? Authorizing Provider  loratadine (CLARITIN) 10 MG tablet Take 10 mg by mouth daily.   Yes Historical Provider, MD  Multiple Vitamin (MULTIVITAMIN WITH MINERALS) TABS tablet Take 1 tablet by mouth daily.   Yes Historical Provider, MD  ondansetron (ZOFRAN-ODT) 8 MG disintegrating tablet Take 1 tablet (8 mg total) by mouth every 8 (eight) hours as needed for nausea. 08/18/16  Yes Robyn Haber, MD  sucralfate (CARAFATE) 1 GM/10ML  suspension Take 10 mLs (1 g total) by mouth 2 (two) times daily. 08/19/16  Yes Alycia Rossetti, MD    Blood pressure 126/76, pulse 85, temperature 98.4 F (36.9 C), temperature source Oral, resp. rate 18, height '6\' 2"'  (1.88 m), weight 235 lb (106.6 kg), SpO2 97 %. Physical Exam: General: pleasant, WD/WN white male who is laying in bed in NAD HEENT:  head is normocephalic, atraumatic.  Sclera are noninjected.  Mouth is dry Heart: regular, rate, and rhythm.  No obvious murmurs, gallops, or rubs noted.  Palpable pedal pulses bilaterally Lungs: CTAB, no wheezes, rhonchi, or rales noted.  Respiratory effort nonlabored Abd: soft, mild distension, +BS, no masses, hernias, or organomegaly. Globally tender to palpation, most severe RLQ and lower abdomen. No peritonitis MS: all 4 extremities are symmetrical with no cyanosis, clubbing, or edema. Skin: warm and dry with no masses, lesions, or rashes Psych: A&Ox3 with an appropriate affect. Neuro: CM 2-12 intact, extremity CSM intact bilaterally, normal speech  Results for orders placed or performed during the hospital encounter of 08/20/16 (from the past 48 hour(s))  Lipase, blood     Status: None   Collection Time: 08/20/16  9:34 AM  Result Value Ref Range   Lipase 13 11 - 51 U/L  Comprehensive metabolic panel     Status: Abnormal   Collection Time: 08/20/16  9:34 AM  Result Value Ref Range   Sodium 137 135 - 145 mmol/L   Potassium 3.3 (L) 3.5 - 5.1 mmol/L   Chloride 100 (L) 101 - 111 mmol/L   CO2 25 22 - 32 mmol/L   Glucose, Bld 114 (H) 65 - 99 mg/dL   BUN 28 (H) 6 - 20 mg/dL   Creatinine, Ser 0.86 0.61 - 1.24 mg/dL   Calcium 9.6 8.9 - 10.3 mg/dL   Total Protein 7.4 6.5 - 8.1 g/dL   Albumin 3.9 3.5 - 5.0 g/dL   AST 19 15 - 41 U/L   ALT 21 17 - 63 U/L   Alkaline Phosphatase 48 38 - 126 U/L   Total Bilirubin 2.5 (H) 0.3 - 1.2 mg/dL   GFR calc non Af Amer >60 >60 mL/min   GFR calc Af Amer >60 >60 mL/min    Comment: (NOTE) The eGFR has been calculated using the CKD EPI equation. This calculation has not been validated in all clinical situations. eGFR's persistently <60 mL/min signify possible Chronic Kidney Disease.    Anion gap 12 5 - 15  CBC     Status: None   Collection Time: 08/20/16  9:34 AM  Result Value Ref Range   WBC 10.0 4.0 - 10.5 K/uL   RBC 5.01 4.22 - 5.81 MIL/uL    Hemoglobin 16.1 13.0 - 17.0 g/dL   HCT 44.7 39.0 - 52.0 %   MCV 89.2 78.0 - 100.0 fL   MCH 32.1 26.0 - 34.0 pg   MCHC 36.0 30.0 - 36.0 g/dL   RDW 12.7 11.5 - 15.5 %   Platelets 251 150 - 400 K/uL  Urinalysis, Routine w reflex microscopic     Status: Abnormal   Collection Time: 08/20/16 10:45 AM  Result Value Ref Range   Color, Urine YELLOW YELLOW   APPearance CLEAR CLEAR   Specific Gravity, Urine 1.024 1.005 - 1.030   pH 6.0 5.0 - 8.0   Glucose, UA NEGATIVE NEGATIVE mg/dL   Hgb urine dipstick NEGATIVE NEGATIVE   Bilirubin Urine NEGATIVE NEGATIVE   Ketones, ur 20 (A) NEGATIVE mg/dL  Protein, ur 30 (A) NEGATIVE mg/dL   Nitrite NEGATIVE NEGATIVE   Leukocytes, UA NEGATIVE NEGATIVE   RBC / HPF 0-5 0 - 5 RBC/hpf   WBC, UA 0-5 0 - 5 WBC/hpf   Bacteria, UA NONE SEEN NONE SEEN   Squamous Epithelial / LPF NONE SEEN NONE SEEN   Mucous PRESENT    Ct Abdomen Pelvis W Contrast  Result Date: 08/20/2016 CLINICAL DATA:  Abdominal pain, vomiting EXAM: CT ABDOMEN AND PELVIS WITH CONTRAST TECHNIQUE: Multidetector CT imaging of the abdomen and pelvis was performed using the standard protocol following bolus administration of intravenous contrast. CONTRAST:  170m ISOVUE-300 IOPAMIDOL (ISOVUE-300) INJECTION 61% COMPARISON:  None. FINDINGS: Lower chest: Airspace opacities noted in both lung bases, likely atelectasis although early infiltrates cannot be excluded particularly in the right lung base. Trace right pleural effusion. Heart is normal size. Hepatobiliary: No focal hepatic abnormality. Gallbladder unremarkable. Pancreas: No focal abnormality or ductal dilatation. Spleen: No focal abnormality.  Normal size. Adrenals/Urinary Tract: No adrenal abnormality. No focal renal abnormality. No stones or hydronephrosis. Urinary bladder is unremarkable. Stomach/Bowel: Left colonic diverticulosis. No active diverticulitis. There are dilated fluid-filled small bowel loops in the abdomen and into the pelvis. Distal  small bowel loops are decompressed. Exact transition point is not visualized, but findings are compatible with small bowel obstruction. In addition, there is free air in the abdomen concerning for bowel perforation. Vascular/Lymphatic: No evidence of aneurysm or adenopathy. Reproductive: No visible focal abnormality. Other: Moderate free fluid in the pelvis. Free air noted in the abdomen as mentioned previously. Musculoskeletal: No acute bony abnormality or focal bone lesion. IMPRESSION: Dilated small bowel loops with decompressed distal small bowel loops compatible with small bowel obstruction. Free air and free fluid in the abdomen and pelvis compatible with bowel perforation. Exact source/ site not visualized. Colonic diverticulosis.  No active diverticulitis. Predominately linear densities in the lung bases, likely atelectasis although early infiltrates cannot be excluded. Trace right pleural effusion. Critical Value/emergent results were called by telephone at the time of interpretation on 08/20/2016 at 11:32 am to Dr. DVeryl Speak, who verbally acknowledged these results. Electronically Signed   By: KRolm BaptiseM.D.   On: 08/20/2016 11:33      Assessment/Plan SBO with perforation - abdominal pain, nausea, and vomiting began 4 days ago - CT scan shows small bowel obstruction with free air and free fluid in the abdomen and pelvis compatible with bowel perforation, exact source/ site not visualized; colonic diverticulosis no active diverticulitis - WBC 10, afebrile, VS WNL - no signs of peritonitis on exam  Hypokalemia - K 3.3, replace in IVF Hyperbilirubinemia - 2.5, monitor  ID - merrem 1/31>> VTE - SCDs FEN - NPO/NGT, IVF  Plan - Admit to med-surg. NG tube for decompression. Recommend NPO, IVF, pain control, antiemetics, antibiotics (merrem), electrolyte replacement. Will review CT with MD.  BJerrye Beavers PH[facility] 08/20/2016, 11:55 AM Pager:  [phone]: [phone]-Fri 7:00 am-4:30 pm Sat-Sun 7:00 am-11:30 am

## 2016-08-20 NOTE — ED Triage Notes (Signed)
Patient reports N/V/D and abdominal cramping. X 4 days. Patient was seen at [facility] 2 days ago and physician's office yesterday where he received IV fluids both times.

## 2016-08-20 NOTE — Telephone Encounter (Signed)
Agree with ER VISIT , needs CT abdomen

## 2016-08-20 NOTE — Telephone Encounter (Signed)
Received call from patient mother in law, [place].   States that patient continues to vomit with no relief from Zofran. States that he began vomiting around 9pm on 08/19/2016 and noted to be bile like substance. States that everything he eats or drinks is vomited, and he is vomiting almost eery hour. States that he produces as much as a quart at times.   Advised to go to ER for eval and fluids.

## 2016-08-20 NOTE — ED Notes (Signed)
Report will be given at bedside.

## 2016-08-20 NOTE — Consult Note (Signed)
Chief Complaint: Patient was seen in consultation today for placement of abdominal drain Chief Complaint  Patient presents with  . Emesis  . Diarrhea  . Abdominal Pain    Referring Physician(s): Berna Bue, MD   Supervising Physician: Dr. Oley Balm  Patient Status: [facility] - In-pt  History of Present Illness: [name] is a 36 y.o. male who presents to the IR service for consult of placement of an abdominal drain. Pt presented to [facility] ED on 08/20/16 for N/V/D/abd pain for 4 days. He was admitted to the med-surg unit for a small bowel obstruction with free fluid in the abdomen and pelvis concerning for perf bowel. ? diverticular abscess.  Past Medical History:  Diagnosis Date  . Legionella pneumonia (HCC)    2009    Past Surgical History:  Procedure Laterality Date  . HERNIA REPAIR    . KNEE SURGERY Left     Allergies: Keflex [cephalexin]; Penicillins; and Sulfa antibiotics  Medications: Prior to Admission medications   Medication Sig Start Date End Date Taking? Authorizing Provider  loratadine (CLARITIN) 10 MG tablet Take 10 mg by mouth daily.   Yes Historical Provider, MD  Multiple Vitamin (MULTIVITAMIN WITH MINERALS) TABS tablet Take 1 tablet by mouth daily.   Yes Historical Provider, MD  ondansetron (ZOFRAN-ODT) 8 MG disintegrating tablet Take 1 tablet (8 mg total) by mouth every 8 (eight) hours as needed for nausea. 08/18/16  Yes Elvina Sidle, MD  sucralfate (CARAFATE) 1 GM/10ML suspension Take 10 mLs (1 g total) by mouth 2 (two) times daily. 08/19/16  Yes Salley Scarlet, MD     Family History  Problem Relation Age of Onset  . Diabetes Father   . Cancer Paternal Grandfather     oral cancer  . Mental illness Mother     Social History   Social History  . Marital status: Married    Spouse name: N/A  . Number of children: N/A  . Years of education: N/A   Social History Main Topics  . Smoking status: Never Smoker  . Smokeless  tobacco: Never Used  . Alcohol use No  . Drug use: No  . Sexual activity: Yes     Comment: married, works at [employer]   Other Topics Concern  . None   Social History Narrative  . None      Review of Systems see above  Vital Signs: BP 126/78 (BP Location: Left Arm)   Pulse 100   Temp 98.4 F (36.9 C) (Oral)   Resp 18   Ht 6\' 2"  (1.88 m)   Wt 235 lb (106.6 kg)   SpO2 98%   BMI 30.17 kg/m   Physical Exam  Pt. Awake, alert, oriented, and able to follow commands, Cardio: RRR no m/r/g no peripheral edema, Pulm: lung fields CTA bi-lat, no w/r/r, GI: abdomen with  hypoactive BS, mild dist/lower abd tenderness  Mallampati Score:     Imaging: Ct Abdomen Pelvis W Contrast  Result Date: 08/20/2016 CLINICAL DATA:  Abdominal pain, vomiting EXAM: CT ABDOMEN AND PELVIS WITH CONTRAST TECHNIQUE: Multidetector CT imaging of the abdomen and pelvis was performed using the standard protocol following bolus administration of intravenous contrast. CONTRAST:  ISOVUE-300 IOPAMIDOL (ISOVUE-300) INJECTION 61% COMPARISON:  None. FINDINGS: Lower chest: Airspace opacities noted in both lung bases, likely atelectasis although early infiltrates cannot be excluded particularly in the right lung base. Trace right pleural effusion. Heart is normal size. Hepatobiliary: No focal hepatic abnormality. Gallbladder unremarkable. Pancreas:  No focal abnormality or ductal dilatation. Spleen: No focal abnormality.  Normal size. Adrenals/Urinary Tract: No adrenal abnormality. No focal renal abnormality. No stones or hydronephrosis. Urinary bladder is unremarkable. Stomach/Bowel: Left colonic diverticulosis. No active diverticulitis. There are dilated fluid-filled small bowel loops in the abdomen and into the pelvis. Distal small bowel loops are decompressed. Exact transition point is not visualized, but findings are compatible with small bowel obstruction. In addition, there is free air in the abdomen concerning for  bowel perforation. Vascular/Lymphatic: No evidence of aneurysm or adenopathy. Reproductive: No visible focal abnormality. Other: Moderate free fluid in the pelvis. Free air noted in the abdomen as mentioned previously. Musculoskeletal: No acute bony abnormality or focal bone lesion. IMPRESSION: Dilated small bowel loops with decompressed distal small bowel loops compatible with small bowel obstruction. Free air and free fluid in the abdomen and pelvis compatible with bowel perforation. Exact source/ site not visualized. Colonic diverticulosis.  No active diverticulitis. Predominately linear densities in the lung bases, likely atelectasis although early infiltrates cannot be excluded. Trace right pleural effusion. Critical Value/emergent results were called by telephone at the time of interpretation on 08/20/2016 at 11:32 am to Dr. Geoffery LyonsUGLAS DELO , who verbally acknowledged these results. Electronically Signed   By: Charlett NoseKevin  Dover M.D.   On: 08/20/2016 11:33   Dg Abd Portable 1v  Result Date: 08/20/2016 CLINICAL DATA:  Nasogastric tube placement. EXAM: PORTABLE ABDOMEN - 1 VIEW COMPARISON:  None. FINDINGS: Distal tip of nasogastric tube is seen in proximal stomach. Dilated small bowel loops are noted concerning for distal small bowel obstruction. No colonic dilatation is noted. IMPRESSION: Distal tip of nasogastric tube seen in proximal stomach. Dilated small bowel loops are noted concerning for distal small bowel obstruction. Electronically Signed   By: Lupita RaiderJames  Green Jr, M.D.   On: 08/20/2016 15:17    Labs:  CBC:  Recent Labs  08/19/16 1055 08/20/16 0934 08/20/16 1333  WBC 10.5 10.0 8.5  HGB 16.0 16.1 15.0  HCT 45.8 44.7 42.9  PLT 196 251 219    COAGS: No results for input(s): INR, APTT in the last 8760 hours.  BMP:  Recent Labs  08/19/16 1056 08/20/16 0934 08/20/16 1333  NA 139 137  --   K 3.8 3.3*  --   CL 100 100*  --   CO2 23 25  --   GLUCOSE 135* 114*  --   BUN 30* 28*  --     CALCIUM 9.4 9.6  --   CREATININE 1.03 0.86 0.88  GFRNONAA  --  >60 >60  GFRAA  --  >60 >60    LIVER FUNCTION TESTS:  Recent Labs  08/19/16 1056 08/20/16 0934  BILITOT 2.4* 2.5*  AST 16 19  ALT 16 21  ALKPHOS 57 48  PROT 6.5 7.4  ALBUMIN 3.8 3.9    TUMOR MARKERS: No results for input(s): AFPTM, CEA, CA199, CHROMGRNA in the last 8760 hours.  Assessment and Plan:  Pt. is a 36 y.o. male who presents to the IR service for consult of placement of an abdominal drain. [facility] for N/V/D/abd pain for 4 days. He was subsequently admitted for a small bowel obstruction.Now with ? perf bowel/divert abscess; Dr. Grace IsaacWatts has reviewed the imaging and abscess appears amenable to drain via TG approach. Procedure tent scheduled for 2/1. Risks and benefits discussed with the patient including bleeding, infection, damage to adjacent structures, bowel perforation/fistula connection, and sepsis. All of the patient's questions were  answered, patient is agreeable to proceed. Consent signed and in chart.    Thank you for this interesting consult.  I greatly enjoyed meeting [name] and look forward to participating in their care.  A copy of this report was sent to the requesting provider on this date.  Electronically Signed: D. Caryn Bee Justiss Gerbino/ Duanne Limerick 08/20/2016, 4:17 PM   I spent a total of 30 minutes    in face to face in clinical consultation, greater than 50% of which was counseling/coordinating care for CT guided abdominal abscess drain placement

## 2016-08-21 ENCOUNTER — Observation Stay (HOSPITAL_COMMUNITY): Payer: BLUE CROSS/BLUE SHIELD

## 2016-08-21 ENCOUNTER — Encounter (HOSPITAL_COMMUNITY): Payer: Self-pay | Admitting: Radiology

## 2016-08-21 DIAGNOSIS — R1084 Generalized abdominal pain: Secondary | ICD-10-CM | POA: Diagnosis present

## 2016-08-21 DIAGNOSIS — B9561 Methicillin susceptible Staphylococcus aureus infection as the cause of diseases classified elsewhere: Secondary | ICD-10-CM | POA: Diagnosis present

## 2016-08-21 DIAGNOSIS — K56609 Unspecified intestinal obstruction, unspecified as to partial versus complete obstruction: Secondary | ICD-10-CM | POA: Diagnosis present

## 2016-08-21 DIAGNOSIS — Z882 Allergy status to sulfonamides status: Secondary | ICD-10-CM | POA: Diagnosis not present

## 2016-08-21 DIAGNOSIS — Z880 Allergy status to penicillin: Secondary | ICD-10-CM | POA: Diagnosis not present

## 2016-08-21 DIAGNOSIS — R17 Unspecified jaundice: Secondary | ICD-10-CM | POA: Diagnosis present

## 2016-08-21 DIAGNOSIS — E876 Hypokalemia: Secondary | ICD-10-CM | POA: Diagnosis present

## 2016-08-21 DIAGNOSIS — B9620 Unspecified Escherichia coli [E. coli] as the cause of diseases classified elsewhere: Secondary | ICD-10-CM | POA: Diagnosis present

## 2016-08-21 DIAGNOSIS — B952 Enterococcus as the cause of diseases classified elsewhere: Secondary | ICD-10-CM | POA: Diagnosis present

## 2016-08-21 DIAGNOSIS — Z881 Allergy status to other antibiotic agents status: Secondary | ICD-10-CM | POA: Diagnosis not present

## 2016-08-21 DIAGNOSIS — K567 Ileus, unspecified: Secondary | ICD-10-CM | POA: Diagnosis present

## 2016-08-21 DIAGNOSIS — K651 Peritoneal abscess: Secondary | ICD-10-CM | POA: Diagnosis present

## 2016-08-21 DIAGNOSIS — K631 Perforation of intestine (nontraumatic): Secondary | ICD-10-CM | POA: Diagnosis present

## 2016-08-21 LAB — COMPREHENSIVE METABOLIC PANEL
ALT: 20 U/L (ref 17–63)
ANION GAP: 10 (ref 5–15)
AST: 15 U/L (ref 15–41)
Albumin: 3 g/dL — ABNORMAL LOW (ref 3.5–5.0)
Alkaline Phosphatase: 47 U/L (ref 38–126)
BUN: 27 mg/dL — ABNORMAL HIGH (ref 6–20)
CHLORIDE: 107 mmol/L (ref 101–111)
CO2: 25 mmol/L (ref 22–32)
Calcium: 8.7 mg/dL — ABNORMAL LOW (ref 8.9–10.3)
Creatinine, Ser: 0.74 mg/dL (ref 0.61–1.24)
Glucose, Bld: 98 mg/dL (ref 65–99)
POTASSIUM: 3.3 mmol/L — AB (ref 3.5–5.1)
Sodium: 142 mmol/L (ref 135–145)
Total Bilirubin: 2.5 mg/dL — ABNORMAL HIGH (ref 0.3–1.2)
Total Protein: 6.4 g/dL — ABNORMAL LOW (ref 6.5–8.1)

## 2016-08-21 LAB — CBC
HCT: 39.1 % (ref 39.0–52.0)
Hemoglobin: 13 g/dL (ref 13.0–17.0)
MCH: 30.4 pg (ref 26.0–34.0)
MCHC: 33.2 g/dL (ref 30.0–36.0)
MCV: 91.4 fL (ref 78.0–100.0)
PLATELETS: 223 10*3/uL (ref 150–400)
RBC: 4.28 MIL/uL (ref 4.22–5.81)
RDW: 13 % (ref 11.5–15.5)
WBC: 8 10*3/uL (ref 4.0–10.5)

## 2016-08-21 LAB — PROTIME-INR
INR: 1.05
PROTHROMBIN TIME: 13.7 s (ref 11.4–15.2)

## 2016-08-21 LAB — APTT: APTT: 30 s (ref 24–36)

## 2016-08-21 MED ORDER — FENTANYL CITRATE (PF) 100 MCG/2ML IJ SOLN
INTRAMUSCULAR | Status: AC
  Filled 2016-08-21: qty 4

## 2016-08-21 MED ORDER — FENTANYL CITRATE (PF) 100 MCG/2ML IJ SOLN
INTRAMUSCULAR | Status: AC | PRN
  Administered 2016-08-21: 25 ug via INTRAVENOUS
  Administered 2016-08-21: 50 ug via INTRAVENOUS
  Administered 2016-08-21: 25 ug via INTRAVENOUS

## 2016-08-21 MED ORDER — MIDAZOLAM HCL 2 MG/2ML IJ SOLN
INTRAMUSCULAR | Status: AC | PRN
  Administered 2016-08-21 (×5): 1 mg via INTRAVENOUS

## 2016-08-21 MED ORDER — SODIUM CHLORIDE 0.9 % IV SOLN
30.0000 meq | Freq: Once | INTRAVENOUS | Status: AC
  Administered 2016-08-21: 30 meq via INTRAVENOUS
  Filled 2016-08-21: qty 15

## 2016-08-21 MED ORDER — MIDAZOLAM HCL 2 MG/2ML IJ SOLN
INTRAMUSCULAR | Status: AC
  Filled 2016-08-21: qty 6

## 2016-08-21 NOTE — Procedures (Signed)
CT pelvic 37F abscess drain catheter placed 35ml thin purulent output sent for GS, C&S No complication No blood loss. [facility].

## 2016-08-21 NOTE — Progress Notes (Signed)
MEDICATION-RELATED CONSULT NOTE   IR Procedure Consult - Anticoagulant/Antiplatelet PTA/Inpatient Med List Review by Pharmacist    Procedure: CT pelvic abscess drain cath placement    Completed: 2/1 at 1500  Post-Procedural bleeding risk per IR MD assessment:  standard  Antithrombotic medications on inpatient or PTA profile prior to procedure:   Lovenox 40mg     Recommended restart time per IR Post-Procedure Guidelines:  Next morning in AM   Other considerations:      Plan:    Consult was not released post procedure, so Lovenox has already been given today at 1631 instead of retimed for tomorrow AM as per guidelines. Follow closely for bleeding  Hessie KnowsJustin M Viviann Broyles, PharmD, BCPS Pager [phone] 08/21/2016 5:58 PM

## 2016-08-21 NOTE — Progress Notes (Signed)
S: No acute events. Minimal abdominal pain. Tiny bowel movement overnight.   Vitals, labs, intake/output, and orders reviewed at this time: Afebrile, no tachycardia. 800 out from Ng, 480 PO recorded. Bicarb, creatinine and WBC within normal limits. Tbili stable at 2.5. Hypokalemia 3.3  Gen: A&Ox3, no distress  H&N: EOMI, atraumatic, neck supple Chest: unlabored respirations, RRR Abd: soft, nontender, nondistended. NG with bilious output.  Ext: warm, no edema Neuro: grossly normal  Lines/tubes/drains: PIV, NG  A/P:  36yo male with sbo vs ileus and evidence of prior perforation. -Replace K -Continue abx, NGT, IVF -IR for perc drain today   Phylliss Blakeshelsea Deanne Bedgood, MD [facility] [place] Pager [phone]

## 2016-08-21 NOTE — Progress Notes (Signed)
Back to room via bed from CT accompanied by Loraine LericheMark, RN. VSS. Dressing clean, dry and intact to rt buttock.

## 2016-08-22 ENCOUNTER — Inpatient Hospital Stay (HOSPITAL_COMMUNITY): Payer: BLUE CROSS/BLUE SHIELD

## 2016-08-22 LAB — CBC
HEMATOCRIT: 38.7 % — AB (ref 39.0–52.0)
HEMOGLOBIN: 13.3 g/dL (ref 13.0–17.0)
MCH: 31 pg (ref 26.0–34.0)
MCHC: 34.4 g/dL (ref 30.0–36.0)
MCV: 90.2 fL (ref 78.0–100.0)
Platelets: 221 10*3/uL (ref 150–400)
RBC: 4.29 MIL/uL (ref 4.22–5.81)
RDW: 12.8 % (ref 11.5–15.5)
WBC: 9.7 10*3/uL (ref 4.0–10.5)

## 2016-08-22 LAB — COMPREHENSIVE METABOLIC PANEL
ALBUMIN: 3 g/dL — AB (ref 3.5–5.0)
ALK PHOS: 47 U/L (ref 38–126)
ALT: 27 U/L (ref 17–63)
ANION GAP: 9 (ref 5–15)
AST: 18 U/L (ref 15–41)
BUN: 22 mg/dL — ABNORMAL HIGH (ref 6–20)
CALCIUM: 8.4 mg/dL — AB (ref 8.9–10.3)
CO2: 24 mmol/L (ref 22–32)
Chloride: 108 mmol/L (ref 101–111)
Creatinine, Ser: 0.67 mg/dL (ref 0.61–1.24)
GLUCOSE: 90 mg/dL (ref 65–99)
POTASSIUM: 3.6 mmol/L (ref 3.5–5.1)
Sodium: 141 mmol/L (ref 135–145)
TOTAL PROTEIN: 5.7 g/dL — AB (ref 6.5–8.1)
Total Bilirubin: 2.6 mg/dL — ABNORMAL HIGH (ref 0.3–1.2)

## 2016-08-22 MED ORDER — IOPAMIDOL (ISOVUE-300) INJECTION 61%
INTRAVENOUS | Status: AC
  Administered 2016-08-22: 30 mL
  Filled 2016-08-22: qty 30

## 2016-08-22 MED ORDER — IOPAMIDOL (ISOVUE-300) INJECTION 61%
30.0000 mL | Freq: Once | INTRAVENOUS | Status: AC
  Administered 2016-08-22: 30 mL via ORAL

## 2016-08-22 MED ORDER — IOPAMIDOL (ISOVUE-300) INJECTION 61%
INTRAVENOUS | Status: AC
  Filled 2016-08-22: qty 150

## 2016-08-22 MED ORDER — IOPAMIDOL (ISOVUE-300) INJECTION 61%: 150.0000 mL | Freq: Once | INTRAVENOUS | Status: DC | PRN

## 2016-08-22 NOTE — Progress Notes (Signed)
Patient ID: [name], male   DOB: 1981-05-01, 36 y.o.   MRN: [id number]    Referring Physician(s): Dr. Phylliss Blakes  Supervising Physician: Ruel Favors  Patient Status: [facility] - In-pt  Chief Complaint: Pelvic abscess  Subjective: Patient feels ok today, but having to drink barium for an UGI he is scheduled for.  Some soreness around his drain site.  Allergies: Keflex [cephalexin]; Penicillins; and Sulfa antibiotics  Medications: Prior to Admission medications   Medication Sig Start Date End Date Taking? Authorizing Provider  loratadine (CLARITIN) 10 MG tablet Take 10 mg by mouth daily.   Yes Historical Provider, MD  Multiple Vitamin (MULTIVITAMIN WITH MINERALS) TABS tablet Take 1 tablet by mouth daily.   Yes Historical Provider, MD  ondansetron (ZOFRAN-ODT) 8 MG disintegrating tablet Take 1 tablet (8 mg total) by mouth every 8 (eight) hours as needed for nausea. 08/18/16  Yes Elvina Sidle, MD  sucralfate (CARAFATE) 1 GM/10ML suspension Take 10 mLs (1 g total) by mouth 2 (two) times daily. 08/19/16  Yes Salley Scarlet, MD    Vital Signs: BP 135/82 (BP Location: Right Arm)   Pulse 90   Temp 98.3 F (36.8 C) (Axillary)   Resp 18   Ht 6\' 2"  (1.88 m)   Wt 235 lb (106.6 kg)   SpO2 95%   BMI 30.17 kg/m   Physical Exam: Abd: soft, less tender, sore around drain site.  Drain with serous output.  About 150cc/24 hrs.  Drain site is c/d/i  Imaging: Ct Abdomen Pelvis W Contrast  Result Date: 08/20/2016 CLINICAL DATA:  Abdominal pain, vomiting EXAM: CT ABDOMEN AND PELVIS WITH CONTRAST TECHNIQUE: Multidetector CT imaging of the abdomen and pelvis was performed using the standard protocol following bolus administration of intravenous contrast. CONTRAST:  ISOVUE-300 IOPAMIDOL (ISOVUE-300) INJECTION 61% COMPARISON:  None. FINDINGS: Lower chest: Airspace opacities noted in both lung bases, likely atelectasis although early infiltrates cannot be excluded particularly in the  right lung base. Trace right pleural effusion. Heart is normal size. Hepatobiliary: No focal hepatic abnormality. Gallbladder unremarkable. Pancreas: No focal abnormality or ductal dilatation. Spleen: No focal abnormality.  Normal size. Adrenals/Urinary Tract: No adrenal abnormality. No focal renal abnormality. No stones or hydronephrosis. Urinary bladder is unremarkable. Stomach/Bowel: Left colonic diverticulosis. No active diverticulitis. There are dilated fluid-filled small bowel loops in the abdomen and into the pelvis. Distal small bowel loops are decompressed. Exact transition point is not visualized, but findings are compatible with small bowel obstruction. In addition, there is free air in the abdomen concerning for bowel perforation. Vascular/Lymphatic: No evidence of aneurysm or adenopathy. Reproductive: No visible focal abnormality. Other: Moderate free fluid in the pelvis. Free air noted in the abdomen as mentioned previously. Musculoskeletal: No acute bony abnormality or focal bone lesion. IMPRESSION: Dilated small bowel loops with decompressed distal small bowel loops compatible with small bowel obstruction. Free air and free fluid in the abdomen and pelvis compatible with bowel perforation. Exact source/ site not visualized. Colonic diverticulosis.  No active diverticulitis. Predominately linear densities in the lung bases, likely atelectasis although early infiltrates cannot be excluded. Trace right pleural effusion. Critical Value/emergent results were called by telephone at the time of interpretation on 08/20/2016 at 11:32 am to Dr. Geoffery Lyons , who verbally acknowledged these results. Electronically Signed   By: Charlett Nose M.D.   On: 08/20/2016 11:33   Dg Abd Portable 1v  Result Date: 08/20/2016 CLINICAL DATA:  Nasogastric tube placement. EXAM: PORTABLE ABDOMEN - 1 VIEW COMPARISON:  None. FINDINGS: Distal tip of nasogastric tube is seen in proximal stomach. Dilated small bowel loops are  noted concerning for distal small bowel obstruction. No colonic dilatation is noted. IMPRESSION: Distal tip of nasogastric tube seen in proximal stomach. Dilated small bowel loops are noted concerning for distal small bowel obstruction. Electronically Signed   By: Lupita RaiderJames  Green Jr, M.D.   On: 08/20/2016 15:17   Dg Kayleen MemosUgi W/water Sol Cm  Result Date: 08/22/2016 CLINICAL DATA:  Follow obstruction, perforation. EXAM: WATER SOLUBLE UPPER GI SERIES TECHNIQUE: Single-column upper GI series was performed using water soluble contrast. CONTRAST:  100 cc Isovue 370 COMPARISON:  CT 08/20/2016 FLUOROSCOPY TIME:  Fluoroscopy Time:  1 minutes 12 seconds Radiation Exposure Index (if provided by the fluoroscopic device): 32 mGy Number of Acquired Spot Images: 1 FINDINGS: Scout image demonstrates abscess drainage catheter projecting over the pelvis. Continued markedly dilated small bowel loops in the abdomen compatible with small bowel obstruction. NG tube is in place with the tip in the proximal duodenum. After ingestion of 100 cc Isovue, no visible abnormality noted in the stomach or duodenum. No evidence of contrast extravasation/ leak. Proximal jejunal loops are dilated, and contrast does not pass beyond this point. This study. IMPRESSION: No visible abnormality within the stomach or duodenum. Proximal jejunum is dilated. Recommend follow-up with CT to evaluate small bowel for site of obstruction and possible site of perforation. Electronically Signed   By: Charlett NoseKevin  Dover M.D.   On: 08/22/2016 09:28   Ct Image Guided Drainage By Percutaneous Catheter  Result Date: 08/21/2016 CLINICAL DATA:  Colonic perforation. Loculated cul-de-sac fluid collection possible abscess. EXAM: CT GUIDED DRAINAGE OF PELVIC ABSCESS ANESTHESIA/SEDATION: Intravenous Fentanyl and Versed were administered as conscious sedation during continuous monitoring of the patient's level of consciousness and physiological / cardiorespiratory status by the radiology  RN, with a total moderate sedation time of fourteen minutes. PROCEDURE: The procedure, risks, benefits, and alternatives were explained to the patient. Questions regarding the procedure were encouraged and answered. The patient understands and consents to the procedure. Patient placed prone. Select axial scans through the pelvis obtained. The cul-de-sac loculated collection was localized and an appropriate skin entry site was determined and marked. The operative field was prepped with chlorhexidinein a sterile fashion, and a sterile drape was applied covering the operative field. A sterile gown and sterile gloves were used for the procedure. Local anesthesia was provided with 1% Lidocaine. Under CT fluoroscopic guidance, an 18 gauge trocar needle was advanced into the collection using a right trans gluteal approach. Thin cloudy yellow foul-smelling fluid could be aspirated. An Amplatz guidewire advanced easily within the collection, its position confirmed on CT. Tract was dilated to facilitate placement of a 12 French pigtail catheter, formed centrally within the collection. 35 mL of aspirate were removed and sent for Gram stain and culture. The catheter was secured externally with 0 Prolene suture and StatLock and placed to gravity drain bag. The patient tolerated the procedure well. COMPLICATIONS: None immediate FINDINGS: Loculated cul-de-sac fluid collection was again localized. 12 French pigtail drain catheter placed from right trans gluteal approach as above. IMPRESSION: 1. Technically successful CT-guided pelvic abscess drain catheter placement. Electronically Signed   By: Corlis Leak  Hassell M.D.   On: 08/21/2016 16:37    Labs:  CBC:  Recent Labs  08/20/16 0934 08/20/16 1333 08/21/16 0455 08/22/16 0747  WBC 10.0 8.5 8.0 9.7  HGB 16.1 15.0 13.0 13.3  HCT 44.7 42.9 39.1 38.7*  PLT 251 219 223 221  COAGS:  Recent Labs  08/21/16 0455  INR 1.05  APTT 30    BMP:  Recent Labs  08/19/16 1056  08/20/16 0934 08/20/16 1333 08/21/16 0455 08/22/16 0747  NA 139 137  --  142 141  K 3.8 3.3*  --  3.3* 3.6  CL 100 100*  --  107 108  CO2 23 25  --  25 24  GLUCOSE 135* 114*  --  98 90  BUN 30* 28*  --  27* 22*  CALCIUM 9.4 9.6  --  8.7* 8.4*  CREATININE 1.03 0.86 0.88 0.74 0.67  GFRNONAA  --  >60 >60 >60 >60  GFRAA  --  >60 >60 >60 >60    LIVER FUNCTION TESTS:  Recent Labs  08/19/16 1056 08/20/16 0934 08/21/16 0455 08/22/16 0747  BILITOT 2.4* 2.5* 2.5* 2.6*  AST 16 19 15 18   ALT 16 21 20 27   ALKPHOS 57 48 47 47  PROT 6.5 7.4 6.4* 5.7*  ALBUMIN 3.8 3.9 3.0* 3.0*    Assessment and Plan: 1. Pelvic fluid collection, s/p perc drain on 2/1 -follow CX -cont routine drain care with irrigations and daily dressing changes -when output is less than 10-15cc/day will plan for follow up CT scan   Electronically Signed: Katrinia Straker E 08/22/2016, 9:44 AM   I spent a total of 15 Minutes at the the patient's bedside AND on the patient's hospital floor or unit, greater than 50% of which was counseling/coordinating care for pelvic fluid collection

## 2016-08-22 NOTE — Progress Notes (Signed)
Patient ID: [name], male   DOB: [birth year], 36 y.o.   MRN: [id number]  [facility] Surgery Progress Note     Subjective: Feeling ok this morning, reports mild lower abdominal pain. States that this is the first time he has felt hungry in several days. Had small BM yesterday and is passing flatus. Sore from drain placement yesterday.   Objective: Vital signs in last 24 hours: Temp:  [98.3 F (36.8 C)-99 F (37.2 C)] 98.3 F (36.8 C) (02/02 0518) Pulse Rate:  [80-98] 90 (02/02 0518) Resp:  [15-18] 18 (02/02 0518) BP: (133-149)/(72-93) 135/82 (02/02 0518) SpO2:  [95 %-100 %] 95 % (02/02 0518) Last BM Date: 08/20/16  Intake/Output from previous day: 02/01 0701 - 02/02 0700 In: 3515 [I.V.:3000; IV Piggyback:500] Out: 3250 [Urine:1550; Emesis/NG output:1550; Drains:150] Intake/Output this shift: No intake/output data recorded.  PE: Gen:  Alert, NAD, pleasant Card:  RRR, no M/G/R heard Pulm:  CTAB, no W/R/R, effort normal Abd: Soft, mild distension, +BS, no HSM, mild lower abdominal tenderness Ext:  No erythema, edema, or tenderness   Lab Results:   Recent Labs  08/20/16 1333 08/21/16 0455  WBC 8.5 8.0  HGB 15.0 13.0  HCT 42.9 39.1  PLT 219 223   BMET  Recent Labs  08/20/16 0934 08/20/16 1333 08/21/16 0455  NA 137  --  142  K 3.3*  --  3.3*  CL 100*  --  107  CO2 25  --  25  GLUCOSE 114*  --  98  BUN 28*  --  27*  CREATININE 0.86 0.88 0.74  CALCIUM 9.6  --  8.7*   PT/INR  Recent Labs  08/21/16 0455  LABPROT 13.7  INR 1.05   CMP     Component Value Date/Time   NA 142 08/21/2016 0455   K 3.3 (L) 08/21/2016 0455   CL 107 08/21/2016 0455   CO2 25 08/21/2016 0455   GLUCOSE 98 08/21/2016 0455   BUN 27 (H) 08/21/2016 0455   CREATININE 0.74 08/21/2016 0455   CREATININE 1.03 08/19/2016 1056   CALCIUM 8.7 (L) 08/21/2016 0455   PROT 6.4 (L) 08/21/2016 0455   ALBUMIN 3.0 (L) 08/21/2016 0455   AST 15 08/21/2016 0455   ALT 20 08/21/2016 0455    ALKPHOS 47 08/21/2016 0455   BILITOT 2.5 (H) 08/21/2016 0455   GFRNONAA >60 08/21/2016 0455   GFRAA >60 08/21/2016 0455   Lipase     Component Value Date/Time   LIPASE 13 08/20/2016 0934       Studies/Results: Ct Abdomen Pelvis W Contrast  Result Date: 08/20/2016 CLINICAL DATA:  Abdominal pain, vomiting EXAM: CT ABDOMEN AND PELVIS WITH CONTRAST TECHNIQUE: Multidetector CT imaging of the abdomen and pelvis was performed using the standard protocol following bolus administration of intravenous contrast. CONTRAST:  ISOVUE-300 IOPAMIDOL (ISOVUE-300) INJECTION 61% COMPARISON:  None. FINDINGS: Lower chest: Airspace opacities noted in both lung bases, likely atelectasis although early infiltrates cannot be excluded particularly in the right lung base. Trace right pleural effusion. Heart is normal size. Hepatobiliary: No focal hepatic abnormality. Gallbladder unremarkable. Pancreas: No focal abnormality or ductal dilatation. Spleen: No focal abnormality.  Normal size. Adrenals/Urinary Tract: No adrenal abnormality. No focal renal abnormality. No stones or hydronephrosis. Urinary bladder is unremarkable. Stomach/Bowel: Left colonic diverticulosis. No active diverticulitis. There are dilated fluid-filled small bowel loops in the abdomen and into the pelvis. Distal small bowel loops are decompressed. Exact transition point is not visualized, but findings are compatible with small bowel  obstruction. In addition, there is free air in the abdomen concerning for bowel perforation. Vascular/Lymphatic: No evidence of aneurysm or adenopathy. Reproductive: No visible focal abnormality. Other: Moderate free fluid in the pelvis. Free air noted in the abdomen as mentioned previously. Musculoskeletal: No acute bony abnormality or focal bone lesion. IMPRESSION: Dilated small bowel loops with decompressed distal small bowel loops compatible with small bowel obstruction. Free air and free fluid in the abdomen and  pelvis compatible with bowel perforation. Exact source/ site not visualized. Colonic diverticulosis.  No active diverticulitis. Predominately linear densities in the lung bases, likely atelectasis although early infiltrates cannot be excluded. Trace right pleural effusion. Critical Value/emergent results were called by telephone at the time of interpretation on 08/20/2016 at 11:32 am to Dr. Geoffery Lyons , who verbally acknowledged these results. Electronically Signed   By: Charlett Nose M.D.   On: 08/20/2016 11:33   Dg Abd Portable 1v  Result Date: 08/20/2016 CLINICAL DATA:  Nasogastric tube placement. EXAM: PORTABLE ABDOMEN - 1 VIEW COMPARISON:  None. FINDINGS: Distal tip of nasogastric tube is seen in proximal stomach. Dilated small bowel loops are noted concerning for distal small bowel obstruction. No colonic dilatation is noted. IMPRESSION: Distal tip of nasogastric tube seen in proximal stomach. Dilated small bowel loops are noted concerning for distal small bowel obstruction. Electronically Signed   By: Lupita Raider, M.D.   On: 08/20/2016 15:17   Ct Image Guided Drainage By Percutaneous Catheter  Result Date: 08/21/2016 CLINICAL DATA:  Colonic perforation. Loculated cul-de-sac fluid collection possible abscess. EXAM: CT GUIDED DRAINAGE OF PELVIC ABSCESS ANESTHESIA/SEDATION: Intravenous Fentanyl and Versed were administered as conscious sedation during continuous monitoring of the patient's level of consciousness and physiological / cardiorespiratory status by the radiology RN, with a total moderate sedation time of fourteen minutes. PROCEDURE: The procedure, risks, benefits, and alternatives were explained to the patient. Questions regarding the procedure were encouraged and answered. The patient understands and consents to the procedure. Patient placed prone. Select axial scans through the pelvis obtained. The cul-de-sac loculated collection was localized and an appropriate skin entry site was  determined and marked. The operative field was prepped with chlorhexidinein a sterile fashion, and a sterile drape was applied covering the operative field. A sterile gown and sterile gloves were used for the procedure. Local anesthesia was provided with 1% Lidocaine. Under CT fluoroscopic guidance, an 18 gauge trocar needle was advanced into the collection using a right trans gluteal approach. Thin cloudy yellow foul-smelling fluid could be aspirated. An Amplatz guidewire advanced easily within the collection, its position confirmed on CT. Tract was dilated to facilitate placement of a 12 French pigtail catheter, formed centrally within the collection. 35 mL of aspirate were removed and sent for Gram stain and culture. The catheter was secured externally with 0 Prolene suture and StatLock and placed to gravity drain bag. The patient tolerated the procedure well. COMPLICATIONS: None immediate FINDINGS: Loculated cul-de-sac fluid collection was again localized. 12 French pigtail drain catheter placed from right trans gluteal approach as above. IMPRESSION: 1. Technically successful CT-guided pelvic abscess drain catheter placement. Electronically Signed   By: Corlis Leak M.D.   On: 08/21/2016 16:37    Anti-infectives: Anti-infectives    Start     Dose/Rate Route Frequency Ordered Stop   08/20/16 1200  meropenem (MERREM) 1 g in sodium chloride 0.9 % 100 mL IVPB     1 g 200 mL/hr over 30 Minutes Intravenous Every 8 hours 08/20/16 1154  Assessment/Plan SBO with perforation - abdominal pain, nausea, and vomiting began 4 days ago - CT scan shows small bowel obstruction with free air and free fluid in the abdomen and pelvis compatible with bowel perforation, exact source/ site not visualized; colonic diverticulosis no active diverticulitis - s/p IR drain placement 2/1 with 35ml thin purulent output, culture pending  Hypokalemia - labs pending Hyperbilirubinemia - labs pending  ID - merrem  1/31>> VTE - SCDs FEN - NPO/NGT, IVF  Plan - S/p IR drain placement yesterday, follow culture. Continue to have high NG output (155850mL/24hr). Planning UGI with water soluble contrast and small bowel follow through today to eval for location of perforation vs healing. Continue NPO/NG tube. Continue IV antibiotics. Labs pending this morning.   LOS: 1 day    Edson SnowballBROOKE A MILLER , [facility] [place] [facility] 08/22/2016, 7:42 AM Pager: [phone] Consults: [phone] Mon-Fri 7:00 am-4:30 pm Sat-Sun 7:00 am-11:30 am

## 2016-08-23 ENCOUNTER — Inpatient Hospital Stay (HOSPITAL_COMMUNITY): Payer: BLUE CROSS/BLUE SHIELD

## 2016-08-23 LAB — CBC WITH DIFFERENTIAL/PLATELET
BASOS ABS: 0 10*3/uL (ref 0.0–0.1)
Basophils Relative: 0 %
Eosinophils Absolute: 0.1 10*3/uL (ref 0.0–0.7)
Eosinophils Relative: 1 %
HEMATOCRIT: 40.9 % (ref 39.0–52.0)
Hemoglobin: 14.2 g/dL (ref 13.0–17.0)
LYMPHS PCT: 13 %
Lymphs Abs: 1.6 10*3/uL (ref 0.7–4.0)
MCH: 31.1 pg (ref 26.0–34.0)
MCHC: 34.7 g/dL (ref 30.0–36.0)
MCV: 89.5 fL (ref 78.0–100.0)
MONO ABS: 0.9 10*3/uL (ref 0.1–1.0)
MONOS PCT: 8 %
NEUTROS ABS: 9.2 10*3/uL — AB (ref 1.7–7.7)
Neutrophils Relative %: 78 %
Platelets: 270 10*3/uL (ref 150–400)
RBC: 4.57 MIL/uL (ref 4.22–5.81)
RDW: 12.6 % (ref 11.5–15.5)
WBC: 11.8 10*3/uL — ABNORMAL HIGH (ref 4.0–10.5)

## 2016-08-23 LAB — COMPREHENSIVE METABOLIC PANEL
ALBUMIN: 3.1 g/dL — AB (ref 3.5–5.0)
ALT: 27 U/L (ref 17–63)
AST: 19 U/L (ref 15–41)
Alkaline Phosphatase: 49 U/L (ref 38–126)
Anion gap: 12 (ref 5–15)
BUN: 18 mg/dL (ref 6–20)
CHLORIDE: 104 mmol/L (ref 101–111)
CO2: 24 mmol/L (ref 22–32)
Calcium: 8.6 mg/dL — ABNORMAL LOW (ref 8.9–10.3)
Creatinine, Ser: 0.66 mg/dL (ref 0.61–1.24)
GFR calc Af Amer: >60 mL/min (ref >60)
GFR calc non Af Amer: >60 mL/min (ref >60)
GLUCOSE: 84 mg/dL (ref 65–99)
POTASSIUM: 3.4 mmol/L — AB (ref 3.5–5.1)
SODIUM: 140 mmol/L (ref 135–145)
Total Bilirubin: 2.3 mg/dL — ABNORMAL HIGH (ref 0.3–1.2)
Total Protein: 5.9 g/dL — ABNORMAL LOW (ref 6.5–8.1)

## 2016-08-23 LAB — CBC
HCT: 40.1 % (ref 39.0–52.0)
Hemoglobin: 13.2 g/dL (ref 13.0–17.0)
MCH: 30.3 pg (ref 26.0–34.0)
MCHC: 32.9 g/dL (ref 30.0–36.0)
MCV: 92 fL (ref 78.0–100.0)
PLATELETS: 255 10*3/uL (ref 150–400)
RBC: 4.36 MIL/uL (ref 4.22–5.81)
RDW: 12.7 % (ref 11.5–15.5)
WBC: 10 10*3/uL (ref 4.0–10.5)

## 2016-08-23 MED ORDER — ENOXAPARIN SODIUM 40 MG/0.4ML ~~LOC~~ SOLN
40.0000 mg | SUBCUTANEOUS | Status: DC
  Administered 2016-08-24 – 2016-08-27 (×4): 40 mg via SUBCUTANEOUS
  Filled 2016-08-23 (×4): qty 0.4

## 2016-08-23 MED ORDER — FENTANYL CITRATE (PF) 100 MCG/2ML IJ SOLN
INTRAMUSCULAR | Status: AC
  Filled 2016-08-23: qty 4

## 2016-08-23 MED ORDER — MIDAZOLAM HCL 2 MG/2ML IJ SOLN
INTRAMUSCULAR | Status: AC | PRN
  Administered 2016-08-23 (×3): 1 mg via INTRAVENOUS

## 2016-08-23 MED ORDER — FENTANYL CITRATE (PF) 100 MCG/2ML IJ SOLN
INTRAMUSCULAR | Status: AC | PRN
  Administered 2016-08-23: 25 ug via INTRAVENOUS
  Administered 2016-08-23: 50 ug via INTRAVENOUS

## 2016-08-23 MED ORDER — MIDAZOLAM HCL 2 MG/2ML IJ SOLN
INTRAMUSCULAR | Status: AC
  Filled 2016-08-23: qty 8

## 2016-08-23 NOTE — Progress Notes (Signed)
Referring Physician(s): Martin,M  Supervising Physician: Simonne Come  Patient Status:  [facility] - In-pt  Chief Complaint: Pelvic abscesses   Subjective: Patient doing fairly well today. Continues to have some mild right lower quadrant tenderness to palpation as well as some soreness at right transgluteal drain site. NG tube still in place. Denies nausea or vomiting.   Allergies: Keflex [cephalexin]; Penicillins; and Sulfa antibiotics  Medications: Prior to Admission medications   Medication Sig Start Date End Date Taking? Authorizing Provider  loratadine (CLARITIN) 10 MG tablet Take 10 mg by mouth daily.   Yes Historical Provider, MD  Multiple Vitamin (MULTIVITAMIN WITH MINERALS) TABS tablet Take 1 tablet by mouth daily.   Yes Historical Provider, MD  ondansetron (ZOFRAN-ODT) 8 MG disintegrating tablet Take 1 tablet (8 mg total) by mouth every 8 (eight) hours as needed for nausea. 08/18/16  Yes Elvina Sidle, MD  sucralfate (CARAFATE) 1 GM/10ML suspension Take 10 mLs (1 g total) by mouth 2 (two) times daily. 08/19/16  Yes Salley Scarlet, MD     Vital Signs: BP 140/72 (BP Location: Right Arm)   Pulse 78   Temp 98 F (36.7 C) (Oral)   Resp 18   Ht 6\' 2"  (1.88 m)   Wt 235 lb (106.6 kg)   SpO2 96%   BMI 30.17 kg/m   Physical Exam awake, alert. Chest with slightly diminished breath sounds at bases. Heart with regular rate and rhythm. Abdomen soft, slightly distended ,positive bowel sounds, nontender right lower quadrant. Right transgluteal drain intact, output 15 mL yellow fluid small tissue fragments.  Imaging: Ct Abdomen Pelvis Wo Contrast  Result Date: 08/22/2016 CLINICAL DATA:  Bowel perforation, post drainage catheter placement on 08/21/2016, having mild lower abdominal pain, passing gas, small bowel movement yesterday EXAM: CT ABDOMEN AND PELVIS WITHOUT CONTRAST TECHNIQUE: Multidetector CT imaging of the abdomen and pelvis was performed following the standard protocol  without IV contrast. Sagittal and coronal MPR images reconstructed from axial data set. Patient drank dilute oral contrast for exam. COMPARISON:  08/20/2016 FINDINGS: Lower chest: Bibasilar atelectasis.  Small RIGHT pleural effusion. Hepatobiliary: Gallbladder and liver unremarkable Pancreas: Normal appearance Spleen: Normal appearance Adrenals/Urinary Tract: Adrenal glands, kidneys, ureters, and decompressed bladder normal appearance. Prostate gland unremarkable. Stomach/Bowel: Dilated proximal small bowel loops which could represent ileus or obstruction. Colon and distal small bowel loops appear decompressed. Few thickened small bowel loops are seen in the mid abdomen. Distal colonic diverticulosis with mild diffuse wall thickening of the sigmoid colon. Uncertain if observed bowel wall thickening is due to enteritis or reactive secondary to peritonitis. Nasogastric tube traverses stomach with tip in duodenal bulb. Stomach otherwise unremarkable. Remaining colon normal appearance. Enlarged appendix containing multiple appendicoliths. No GI contrast extravasation is seen, though the distal small bowel and colon are not yet opacified. Vascular/Lymphatic: Aorta normal caliber.  No adenopathy. Reproductive: N/A Other: Pigtail drainage catheter in the pelvis with decompression of the pelvic collections since the prior study. Small amount of residual free pelvic fluid is identified. In addition, an enlarged collection is seen in the RIGHT pelvis containing air and fluid, 8.3 x 4.8 x 3.9 cm consistent with abscess. Scattered fluid at the pericolic gutters and interloop, none of these as well defined. Persistent free intraperitoneal air. Small umbilical hernia containing fat. Musculoskeletal: Transitional vertebra at the upper sacrum. IMPRESSION: Decreased size of pelvic abscess collection post pigtail drainage catheter placement though an additional RIGHT lateral pelvic abscess collection has significantly increased in  size since the prior  study now 8.3 x 4.8 x 3.9 cm. Additional scattered free air free fluid throughout abdomen. Bowel wall thickening of distal colon and pelvic small bowel loops may be reactive in the setting of peritonitis though cannot completely exclude enteritis. Electronically Signed   By: Ulyses Southward M.D.   On: 08/22/2016 17:35   Ct Abdomen Pelvis W Contrast  Result Date: 08/20/2016 CLINICAL DATA:  Abdominal pain, vomiting EXAM: CT ABDOMEN AND PELVIS WITH CONTRAST TECHNIQUE: Multidetector CT imaging of the abdomen and pelvis was performed using the standard protocol following bolus administration of intravenous contrast. CONTRAST:  ISOVUE-300 IOPAMIDOL (ISOVUE-300) INJECTION 61% COMPARISON:  None. FINDINGS: Lower chest: Airspace opacities noted in both lung bases, likely atelectasis although early infiltrates cannot be excluded particularly in the right lung base. Trace right pleural effusion. Heart is normal size. Hepatobiliary: No focal hepatic abnormality. Gallbladder unremarkable. Pancreas: No focal abnormality or ductal dilatation. Spleen: No focal abnormality.  Normal size. Adrenals/Urinary Tract: No adrenal abnormality. No focal renal abnormality. No stones or hydronephrosis. Urinary bladder is unremarkable. Stomach/Bowel: Left colonic diverticulosis. No active diverticulitis. There are dilated fluid-filled small bowel loops in the abdomen and into the pelvis. Distal small bowel loops are decompressed. Exact transition point is not visualized, but findings are compatible with small bowel obstruction. In addition, there is free air in the abdomen concerning for bowel perforation. Vascular/Lymphatic: No evidence of aneurysm or adenopathy. Reproductive: No visible focal abnormality. Other: Moderate free fluid in the pelvis. Free air noted in the abdomen as mentioned previously. Musculoskeletal: No acute bony abnormality or focal bone lesion. IMPRESSION: Dilated small bowel loops with  decompressed distal small bowel loops compatible with small bowel obstruction. Free air and free fluid in the abdomen and pelvis compatible with bowel perforation. Exact source/ site not visualized. Colonic diverticulosis.  No active diverticulitis. Predominately linear densities in the lung bases, likely atelectasis although early infiltrates cannot be excluded. Trace right pleural effusion. Critical Value/emergent results were called by telephone at the time of interpretation on 08/20/2016 at 11:32 am to Dr. Geoffery Lyons , who verbally acknowledged these results. Electronically Signed   By: Charlett Nose M.D.   On: 08/20/2016 11:33   Dg Abd Portable 1v  Result Date: 08/20/2016 CLINICAL DATA:  Nasogastric tube placement. EXAM: PORTABLE ABDOMEN - 1 VIEW COMPARISON:  None. FINDINGS: Distal tip of nasogastric tube is seen in proximal stomach. Dilated small bowel loops are noted concerning for distal small bowel obstruction. No colonic dilatation is noted. IMPRESSION: Distal tip of nasogastric tube seen in proximal stomach. Dilated small bowel loops are noted concerning for distal small bowel obstruction. Electronically Signed   By: Lupita Raider, M.D.   On: 08/20/2016 15:17   Dg Kayleen Memos W/water Sol Cm  Result Date: 08/22/2016 CLINICAL DATA:  Follow obstruction, perforation. EXAM: WATER SOLUBLE UPPER GI SERIES TECHNIQUE: Single-column upper GI series was performed using water soluble contrast. CONTRAST:  100 cc Isovue 370 COMPARISON:  CT 08/20/2016 FLUOROSCOPY TIME:  Fluoroscopy Time:  1 minutes 12 seconds Radiation Exposure Index (if provided by the fluoroscopic device): 32 mGy Number of Acquired Spot Images: 1 FINDINGS: Scout image demonstrates abscess drainage catheter projecting over the pelvis. Continued markedly dilated small bowel loops in the abdomen compatible with small bowel obstruction. NG tube is in place with the tip in the proximal duodenum. After ingestion of 100 cc Isovue, no visible abnormality  noted in the stomach or duodenum. No evidence of contrast extravasation/ leak. Proximal jejunal loops are dilated, and contrast  does not pass beyond this point. This study. IMPRESSION: No visible abnormality within the stomach or duodenum. Proximal jejunum is dilated. Recommend follow-up with CT to evaluate small bowel for site of obstruction and possible site of perforation. Electronically Signed   By: Charlett NoseKevin  Dover M.D.   On: 08/22/2016 09:28   Ct Image Guided Drainage By Percutaneous Catheter  Result Date: 08/21/2016 CLINICAL DATA:  Colonic perforation. Loculated cul-de-sac fluid collection possible abscess. EXAM: CT GUIDED DRAINAGE OF PELVIC ABSCESS ANESTHESIA/SEDATION: Intravenous Fentanyl and Versed were administered as conscious sedation during continuous monitoring of the patient's level of consciousness and physiological / cardiorespiratory status by the radiology RN, with a total moderate sedation time of fourteen minutes. PROCEDURE: The procedure, risks, benefits, and alternatives were explained to the patient. Questions regarding the procedure were encouraged and answered. The patient understands and consents to the procedure. Patient placed prone. Select axial scans through the pelvis obtained. The cul-de-sac loculated collection was localized and an appropriate skin entry site was determined and marked. The operative field was prepped with chlorhexidinein a sterile fashion, and a sterile drape was applied covering the operative field. A sterile gown and sterile gloves were used for the procedure. Local anesthesia was provided with 1% Lidocaine. Under CT fluoroscopic guidance, an 18 gauge trocar needle was advanced into the collection using a right trans gluteal approach. Thin cloudy yellow foul-smelling fluid could be aspirated. An Amplatz guidewire advanced easily within the collection, its position confirmed on CT. Tract was dilated to facilitate placement of a 12 French pigtail catheter, formed  centrally within the collection. 35 mL of aspirate were removed and sent for Gram stain and culture. The catheter was secured externally with 0 Prolene suture and StatLock and placed to gravity drain bag. The patient tolerated the procedure well. COMPLICATIONS: None immediate FINDINGS: Loculated cul-de-sac fluid collection was again localized. 12 French pigtail drain catheter placed from right trans gluteal approach as above. IMPRESSION: 1. Technically successful CT-guided pelvic abscess drain catheter placement. Electronically Signed   By: Corlis Leak  Hassell M.D.   On: 08/21/2016 16:37    Labs:  CBC:  Recent Labs  08/21/16 0455 08/22/16 0747 08/23/16 0639 08/23/16 1112  WBC 8.0 9.7 10.0 11.8*  HGB 13.0 13.3 13.2 14.2  HCT 39.1 38.7* 40.1 40.9  PLT 223 221 255 270    COAGS:  Recent Labs  08/21/16 0455  INR 1.05  APTT 30    BMP:  Recent Labs  08/20/16 0934 08/20/16 1333 08/21/16 0455 08/22/16 0747 08/23/16 0639  NA 137  --  142 141 140  K 3.3*  --  3.3* 3.6 3.4*  CL 100*  --  107 108 104  CO2 25  --  25 24 24   GLUCOSE 114*  --  98 90 84  BUN 28*  --  27* 22* 18  CALCIUM 9.6  --  8.7* 8.4* 8.6*  CREATININE 0.86 0.88 0.74 0.67 0.66  GFRNONAA >60 >60 >60 >60 >60  GFRAA >60 >60 >60 >60 >60    LIVER FUNCTION TESTS:  Recent Labs  08/20/16 0934 08/21/16 0455 08/22/16 0747 08/23/16 0639  BILITOT 2.5* 2.5* 2.6* 2.3*  AST 19 15 18 19   ALT 21 20 27 27   ALKPHOS 48 47 47 49  PROT 7.4 6.4* 5.7* 5.9*  ALBUMIN 3.9 3.0* 3.0* 3.1*    Assessment and Plan: Patient with history of bowel perforation, pelvic abscesses; status post drainage of loculated cul-de-sac fluid collection on 2/1; drain fluid cultures pending. WBC up slightly  at 11.8, afebrile; follow-up CT abdomen and pelvis yesterday revealed decreased size of drained pelvic abscess but there is an additional right lateral pelvic abscess which has increased in size since previous study. There was additional scattered free  air fluid level throughout abdomen and bowel wall thickening of distal colon and pelvic small bowel loops. Request now received from [facility] for drain placement into right lower pelvic abscess. Case reviewed by Dr. Grace Isaac and abscess appears amenable for drain placement. Risks and benefits discussed with the patient including bleeding, infection, damage to adjacent structures, bowel perforation/fistula connection, and sepsis.All of the patient's questions were answered, patient is agreeable to proceed. Consent signed and in chart. Procedure scheduled for today.         .    Electronically Signed: D. Lysle Morales 08/23/2016, 12:44 PM   I spent a total of 20 minutes at the the patient's bedside AND on the patient's hospital floor or unit, greater than 50% of which was counseling/coordinating care for pelvic abscess drain   Patient ID: [name], male   DOB: August 03, 1980, 36 y.o.   MRN: [id number]

## 2016-08-23 NOTE — Progress Notes (Signed)
Patient ID: [name], male   DOB: August 12, 1980, 36 y.o.   MRN: [id number] [name] [facility] Progress Note:   * No surgery found *  Subjective: Mental status is clear Objective: Vital signs in last 24 hours: Temp:  [98 F (36.7 C)-98.8 F (37.1 C)] 98 F (36.7 C) (02/03 0541) Pulse Rate:  [78-88] 78 (02/03 0541) Resp:  [18] 18 (02/03 0541) BP: (139-167)/(72-96) 140/72 (02/03 0541) SpO2:  [95 %-100 %] 96 % (02/03 0541)  Intake/Output from previous day: 02/02 0701 - 02/03 0700 In: 2265.9 [I.V.:2055.9; IV Piggyback:200] Out: 2910 [Urine:1025; Emesis/NG output:1850; Drains:35] Intake/Output this shift: No intake/output data recorded.  Physical Exam: Work of breathing is normal.  Abdomen shows no rebound or guarding.  Having liquid stools and flatus  Lab Results:  Results for orders placed or performed during the hospital encounter of 08/20/16 (from the past 48 hour(s))  Aerobic/Anaerobic Culture (surgical/deep wound)     Status: None (Preliminary result)   Collection Time: 08/21/16  2:57 PM  Result Value Ref Range   Specimen Description ABSCESS    Special Requests NONE    Gram Stain      ABUNDANT WBC PRESENT,BOTH PMN AND MONONUCLEAR MODERATE GRAM NEGATIVE RODS RARE GRAM POSITIVE RODS    Culture      CULTURE REINCUBATED FOR BETTER GROWTH Performed at [facility], [place] [address] [place], [place] [address]    Report Status PENDING   CBC     Status: Abnormal   Collection Time: 08/22/16  7:47 AM  Result Value Ref Range   WBC 9.7 4.0 - 10.5 K/uL   RBC 4.29 4.22 - 5.81 MIL/uL   Hemoglobin 13.3 13.0 - 17.0 g/dL   HCT 38.7 (L) 39.0 - 52.0 %   MCV 90.2 78.0 - 100.0 fL   MCH 31.0 26.0 - 34.0 pg   MCHC 34.4 30.0 - 36.0 g/dL   RDW 12.8 11.5 - 15.5 %   Platelets 221 150 - 400 K/uL  Comprehensive metabolic panel     Status: Abnormal   Collection Time: 08/22/16  7:47 AM  Result Value Ref Range   Sodium 141 135 - 145 mmol/L   Potassium 3.6 3.5 - 5.1 mmol/L    Chloride 108 101 - 111 mmol/L   CO2 24 22 - 32 mmol/L   Glucose, Bld 90 65 - 99 mg/dL   BUN 22 (H) 6 - 20 mg/dL   Creatinine, Ser 0.67 0.61 - 1.24 mg/dL   Calcium 8.4 (L) 8.9 - 10.3 mg/dL   Total Protein 5.7 (L) 6.5 - 8.1 g/dL   Albumin 3.0 (L) 3.5 - 5.0 g/dL   AST 18 15 - 41 U/L   ALT 27 17 - 63 U/L   Alkaline Phosphatase 47 38 - 126 U/L   Total Bilirubin 2.6 (H) 0.3 - 1.2 mg/dL   GFR calc non Af Amer >60 >60 mL/min   GFR calc Af Amer >60 >60 mL/min    Comment: (NOTE) The eGFR has been calculated using the CKD EPI equation. This calculation has not been validated in all clinical situations. eGFR's persistently <60 mL/min signify possible Chronic Kidney Disease.    Anion gap 9 5 - 15  CBC     Status: None   Collection Time: 08/23/16  6:39 AM  Result Value Ref Range   WBC 10.0 4.0 - 10.5 K/uL   RBC 4.36 4.22 - 5.81 MIL/uL   Hemoglobin 13.2 13.0 - 17.0 g/dL   HCT 40.1 39.0 -  52.0 %   MCV 92.0 78.0 - 100.0 fL   MCH 30.3 26.0 - 34.0 pg   MCHC 32.9 30.0 - 36.0 g/dL   RDW 12.7 11.5 - 15.5 %   Platelets 255 150 - 400 K/uL  Comprehensive metabolic panel     Status: Abnormal   Collection Time: 08/23/16  6:39 AM  Result Value Ref Range   Sodium 140 135 - 145 mmol/L   Potassium 3.4 (L) 3.5 - 5.1 mmol/L   Chloride 104 101 - 111 mmol/L   CO2 24 22 - 32 mmol/L   Glucose, Bld 84 65 - 99 mg/dL   BUN 18 6 - 20 mg/dL   Creatinine, Ser 0.66 0.61 - 1.24 mg/dL   Calcium 8.6 (L) 8.9 - 10.3 mg/dL   Total Protein 5.9 (L) 6.5 - 8.1 g/dL   Albumin 3.1 (L) 3.5 - 5.0 g/dL   AST 19 15 - 41 U/L   ALT 27 17 - 63 U/L   Alkaline Phosphatase 49 38 - 126 U/L   Total Bilirubin 2.3 (H) 0.3 - 1.2 mg/dL   GFR calc non Af Amer >60 >60 mL/min   GFR calc Af Amer >60 >60 mL/min    Comment: (NOTE) The eGFR has been calculated using the CKD EPI equation. This calculation has not been validated in all clinical situations. eGFR's persistently <60 mL/min signify possible Chronic Kidney Disease.    Anion  gap 12 5 - 15    Radiology/Results: Ct Abdomen Pelvis Wo Contrast  Result Date: 08/22/2016 CLINICAL DATA:  Bowel perforation, post drainage catheter placement on 08/21/2016, having mild lower abdominal pain, passing gas, small bowel movement yesterday EXAM: CT ABDOMEN AND PELVIS WITHOUT CONTRAST TECHNIQUE: Multidetector CT imaging of the abdomen and pelvis was performed following the standard protocol without IV contrast. Sagittal and coronal MPR images reconstructed from axial data set. Patient drank dilute oral contrast for exam. COMPARISON:  08/20/2016 FINDINGS: Lower chest: Bibasilar atelectasis.  Small RIGHT pleural effusion. Hepatobiliary: Gallbladder and liver unremarkable Pancreas: Normal appearance Spleen: Normal appearance Adrenals/Urinary Tract: Adrenal glands, kidneys, ureters, and decompressed bladder normal appearance. Prostate gland unremarkable. Stomach/Bowel: Dilated proximal small bowel loops which could represent ileus or obstruction. Colon and distal small bowel loops appear decompressed. Few thickened small bowel loops are seen in the mid abdomen. Distal colonic diverticulosis with mild diffuse wall thickening of the sigmoid colon. Uncertain if observed bowel wall thickening is due to enteritis or reactive secondary to peritonitis. Nasogastric tube traverses stomach with tip in duodenal bulb. Stomach otherwise unremarkable. Remaining colon normal appearance. Enlarged appendix containing multiple appendicoliths. No GI contrast extravasation is seen, though the distal small bowel and colon are not yet opacified. Vascular/Lymphatic: Aorta normal caliber.  No adenopathy. Reproductive: N/A Other: Pigtail drainage catheter in the pelvis with decompression of the pelvic collections since the prior study. Small amount of residual free pelvic fluid is identified. In addition, an enlarged collection is seen in the RIGHT pelvis containing air and fluid, 8.3 x 4.8 x 3.9 cm consistent with abscess.  Scattered fluid at the pericolic gutters and interloop, none of these as well defined. Persistent free intraperitoneal air. Small umbilical hernia containing fat. Musculoskeletal: Transitional vertebra at the upper sacrum. IMPRESSION: Decreased size of pelvic abscess collection post pigtail drainage catheter placement though an additional RIGHT lateral pelvic abscess collection has significantly increased in size since the prior study now 8.3 x 4.8 x 3.9 cm. Additional scattered free air free fluid throughout abdomen. Bowel wall thickening of distal  colon and pelvic small bowel loops may be reactive in the setting of peritonitis though cannot completely exclude enteritis. Electronically Signed   By: Lavonia Dana M.D.   On: 08/22/2016 17:35   Dg Duanne Limerick W/water Sol Cm  Result Date: 08/22/2016 CLINICAL DATA:  Follow obstruction, perforation. EXAM: WATER SOLUBLE UPPER GI SERIES TECHNIQUE: Single-column upper GI series was performed using water soluble contrast. CONTRAST:  100 cc Isovue 370 COMPARISON:  CT 08/20/2016 FLUOROSCOPY TIME:  Fluoroscopy Time:  1 minutes 12 seconds Radiation Exposure Index (if provided by the fluoroscopic device): 32 mGy Number of Acquired Spot Images: 1 FINDINGS: Scout image demonstrates abscess drainage catheter projecting over the pelvis. Continued markedly dilated small bowel loops in the abdomen compatible with small bowel obstruction. NG tube is in place with the tip in the proximal duodenum. After ingestion of 100 cc Isovue, no visible abnormality noted in the stomach or duodenum. No evidence of contrast extravasation/ leak. Proximal jejunal loops are dilated, and contrast does not pass beyond this point. This study. IMPRESSION: No visible abnormality within the stomach or duodenum. Proximal jejunum is dilated. Recommend follow-up with CT to evaluate small bowel for site of obstruction and possible site of perforation. Electronically Signed   By: Rolm Baptise M.D.   On: 08/22/2016 09:28    Ct Image Guided Drainage By Percutaneous Catheter  Result Date: 08/21/2016 CLINICAL DATA:  Colonic perforation. Loculated cul-de-sac fluid collection possible abscess. EXAM: CT GUIDED DRAINAGE OF PELVIC ABSCESS ANESTHESIA/SEDATION: Intravenous Fentanyl and Versed were administered as conscious sedation during continuous monitoring of the patient's level of consciousness and physiological / cardiorespiratory status by the radiology RN, with a total moderate sedation time of fourteen minutes. PROCEDURE: The procedure, risks, benefits, and alternatives were explained to the patient. Questions regarding the procedure were encouraged and answered. The patient understands and consents to the procedure. Patient placed prone. Select axial scans through the pelvis obtained. The cul-de-sac loculated collection was localized and an appropriate skin entry site was determined and marked. The operative field was prepped with chlorhexidinein a sterile fashion, and a sterile drape was applied covering the operative field. A sterile gown and sterile gloves were used for the procedure. Local anesthesia was provided with 1% Lidocaine. Under CT fluoroscopic guidance, an 18 gauge trocar needle was advanced into the collection using a right trans gluteal approach. Thin cloudy yellow foul-smelling fluid could be aspirated. An Amplatz guidewire advanced easily within the collection, its position confirmed on CT. Tract was dilated to facilitate placement of a 12 French pigtail catheter, formed centrally within the collection. 35 mL of aspirate were removed and sent for Gram stain and culture. The catheter was secured externally with 0 Prolene suture and StatLock and placed to gravity drain bag. The patient tolerated the procedure well. COMPLICATIONS: None immediate FINDINGS: Loculated cul-de-sac fluid collection was again localized. 12 French pigtail drain catheter placed from right trans gluteal approach as above. IMPRESSION: 1.  Technically successful CT-guided pelvic abscess drain catheter placement. Electronically Signed   By: Lucrezia Europe M.D.   On: 08/21/2016 16:37    Anti-infectives: Anti-infectives    Start     Dose/Rate Route Frequency Ordered Stop   08/20/16 1200  meropenem (MERREM) 1 g in sodium chloride 0.9 % 100 mL IVPB     1 g 200 mL/hr over 30 Minutes Intravenous Every 8 hours 08/20/16 1154        Assessment/Plan: Problem List: Patient Active Problem List   Diagnosis Date Noted  . SBO (small  bowel obstruction) 08/21/2016  . Small bowel obstruction 08/20/2016    CT done last night reviewed.  New right sided pelvic collection will discuss with radiology about drainage.   * No surgery found *    LOS: 2 days   Matt B. Hassell Done, MD, [facility] [phone] beeper [phone]  08/23/2016 10:15 AM

## 2016-08-23 NOTE — Progress Notes (Signed)
MEDICATION-RELATED CONSULT NOTE   IR Procedure Consult - Anticoagulant/Antiplatelet PTA/Inpatient Med List Review by Pharmacist    Procedure: CT guided drainage catheter placement into the right lower abdominal quadrant    Completed: 2/3 at 15:06  Post-Procedural bleeding risk per IR MD assessment:  Standard  Antithrombotic medications on inpatient or PTA profile prior to procedure:       Recommended restart time per IR Post-Procedure Guidelines:  Day + 1 (Next AM)    Other considerations:      Plan:    Resume Lovenox 40mg  SQ q24h on 2/4 at 10:00 AM     Loralee PacasErin Taji Barretto, PharmD, BCPS Pager: [phone] 08/23/2016 3:27 PM

## 2016-08-23 NOTE — Procedures (Signed)
Technically successful CT guided placed of a 10 Fr drainage catheter placement into the right lower abdominal quadrant yielding 30 cc of purulent fluid.   All aspirated samples sent to the laboratory for analysis.   EBL: None No immediate post procedural complications.   Katherina RightJay Janaiyah Blackard, MD Pager #: [phone]

## 2016-08-24 NOTE — Progress Notes (Signed)
Patient ID: [name], male   DOB: [birth year], 36 y.o.   MRN: [id number] [facility] Surgery Progress Note:   * No surgery found *  Subjective: Mental status is clear and he is feeling better.  Some RLQ pain resolved after abscess drainage. Objective: Vital signs in last 24 hours: Temp:  [97.9 F (36.6 C)-99.3 F (37.4 C)] 98.2 F (36.8 C) (02/04 0512) Pulse Rate:  [77-93] 87 (02/04 0512) Resp:  [18-20] 18 (02/04 0512) BP: (128-143)/(72-82) 129/81 (02/04 0512) SpO2:  [94 %-100 %] 98 % (02/04 0512)  Intake/Output from previous day: 02/03 0701 - 02/04 0700 In: 3307.1 [I.V.:2977.1; IV Piggyback:300] Out: 2369 [Urine:1100; Emesis/NG output:1125; Drains:144] Intake/Output this shift: No intake/output data recorded.  Physical Exam: Work of breathing is normal.  Two pelvic drains.    Lab Results:  Results for orders placed or performed during the hospital encounter of 08/20/16 (from the past 48 hour(s))  CBC     Status: None   Collection Time: 08/23/16  6:39 AM  Result Value Ref Range   WBC 10.0 4.0 - 10.5 K/uL   RBC 4.36 4.22 - 5.81 MIL/uL   Hemoglobin 13.2 13.0 - 17.0 g/dL   HCT 40.1 39.0 - 52.0 %   MCV 92.0 78.0 - 100.0 fL   MCH 30.3 26.0 - 34.0 pg   MCHC 32.9 30.0 - 36.0 g/dL   RDW 12.7 11.5 - 15.5 %   Platelets 255 150 - 400 K/uL  Comprehensive metabolic panel     Status: Abnormal   Collection Time: 08/23/16  6:39 AM  Result Value Ref Range   Sodium 140 135 - 145 mmol/L   Potassium 3.4 (L) 3.5 - 5.1 mmol/L   Chloride 104 101 - 111 mmol/L   CO2 24 22 - 32 mmol/L   Glucose, Bld 84 65 - 99 mg/dL   BUN 18 6 - 20 mg/dL   Creatinine, Ser 0.66 0.61 - 1.24 mg/dL   Calcium 8.6 (L) 8.9 - 10.3 mg/dL   Total Protein 5.9 (L) 6.5 - 8.1 g/dL   Albumin 3.1 (L) 3.5 - 5.0 g/dL   AST 19 15 - 41 U/L   ALT 27 17 - 63 U/L   Alkaline Phosphatase 49 38 - 126 U/L   Total Bilirubin 2.3 (H) 0.3 - 1.2 mg/dL   GFR calc non Af Amer >60 >60 mL/min   GFR calc Af Amer >60 >60 mL/min   Comment: (NOTE) The eGFR has been calculated using the CKD EPI equation. This calculation has not been validated in all clinical situations. eGFR's persistently <60 mL/min signify possible Chronic Kidney Disease.    Anion gap 12 5 - 15  CBC with Differential/Platelet     Status: Abnormal   Collection Time: 08/23/16 11:12 AM  Result Value Ref Range   WBC 11.8 (H) 4.0 - 10.5 K/uL   RBC 4.57 4.22 - 5.81 MIL/uL   Hemoglobin 14.2 13.0 - 17.0 g/dL   HCT 40.9 39.0 - 52.0 %   MCV 89.5 78.0 - 100.0 fL   MCH 31.1 26.0 - 34.0 pg   MCHC 34.7 30.0 - 36.0 g/dL   RDW 12.6 11.5 - 15.5 %   Platelets 270 150 - 400 K/uL   Neutrophils Relative % 78 %   Neutro Abs 9.2 (H) 1.7 - 7.7 K/uL   Lymphocytes Relative 13 %   Lymphs Abs 1.6 0.7 - 4.0 K/uL   Monocytes Relative 8 %   Monocytes Absolute 0.9 0.1 - 1.0 K/uL  Eosinophils Relative 1 %   Eosinophils Absolute 0.1 0.0 - 0.7 K/uL   Basophils Relative 0 %   Basophils Absolute 0.0 0.0 - 0.1 K/uL  Aerobic/Anaerobic Culture (surgical/deep wound)     Status: None (Preliminary result)   Collection Time: 08/23/16  3:18 PM  Result Value Ref Range   Specimen Description DRAINAGE ENLARGING DIVERTICULAR ABSCESS    Special Requests Normal    Gram Stain      ABUNDANT WBC PRESENT, PREDOMINANTLY PMN RARE GRAM POSITIVE COCCI IN PAIRS    Culture      CULTURE REINCUBATED FOR BETTER GROWTH Performed at [facility], [place] [address], [place], [place] [address]    Report Status PENDING     Radiology/Results: Ct Abdomen Pelvis Wo Contrast  Result Date: 08/22/2016 CLINICAL DATA:  Bowel perforation, post drainage catheter placement on 08/21/2016, having mild lower abdominal pain, passing gas, small bowel movement yesterday EXAM: CT ABDOMEN AND PELVIS WITHOUT CONTRAST TECHNIQUE: Multidetector CT imaging of the abdomen and pelvis was performed following the standard protocol without IV contrast. Sagittal and coronal MPR images reconstructed from axial data set.  Patient drank dilute oral contrast for exam. COMPARISON:  08/20/2016 FINDINGS: Lower chest: Bibasilar atelectasis.  Small RIGHT pleural effusion. Hepatobiliary: Gallbladder and liver unremarkable Pancreas: Normal appearance Spleen: Normal appearance Adrenals/Urinary Tract: Adrenal glands, kidneys, ureters, and decompressed bladder normal appearance. Prostate gland unremarkable. Stomach/Bowel: Dilated proximal small bowel loops which could represent ileus or obstruction. Colon and distal small bowel loops appear decompressed. Few thickened small bowel loops are seen in the mid abdomen. Distal colonic diverticulosis with mild diffuse wall thickening of the sigmoid colon. Uncertain if observed bowel wall thickening is due to enteritis or reactive secondary to peritonitis. Nasogastric tube traverses stomach with tip in duodenal bulb. Stomach otherwise unremarkable. Remaining colon normal appearance. Enlarged appendix containing multiple appendicoliths. No GI contrast extravasation is seen, though the distal small bowel and colon are not yet opacified. Vascular/Lymphatic: Aorta normal caliber.  No adenopathy. Reproductive: N/A Other: Pigtail drainage catheter in the pelvis with decompression of the pelvic collections since the prior study. Small amount of residual free pelvic fluid is identified. In addition, an enlarged collection is seen in the RIGHT pelvis containing air and fluid, 8.3 x 4.8 x 3.9 cm consistent with abscess. Scattered fluid at the pericolic gutters and interloop, none of these as well defined. Persistent free intraperitoneal air. Small umbilical hernia containing fat. Musculoskeletal: Transitional vertebra at the upper sacrum. IMPRESSION: Decreased size of pelvic abscess collection post pigtail drainage catheter placement though an additional RIGHT lateral pelvic abscess collection has significantly increased in size since the prior study now 8.3 x 4.8 x 3.9 cm. Additional scattered free air free  fluid throughout abdomen. Bowel wall thickening of distal colon and pelvic small bowel loops may be reactive in the setting of peritonitis though cannot completely exclude enteritis. Electronically Signed   By: Lavonia Dana M.D.   On: 08/22/2016 17:35   Dg Duanne Limerick W/water Sol Cm  Result Date: 08/22/2016 CLINICAL DATA:  Follow obstruction, perforation. EXAM: WATER SOLUBLE UPPER GI SERIES TECHNIQUE: Single-column upper GI series was performed using water soluble contrast. CONTRAST:  100 cc Isovue 370 COMPARISON:  CT 08/20/2016 FLUOROSCOPY TIME:  Fluoroscopy Time:  1 minutes 12 seconds Radiation Exposure Index (if provided by the fluoroscopic device): 32 mGy Number of Acquired Spot Images: 1 FINDINGS: Scout image demonstrates abscess drainage catheter projecting over the pelvis. Continued markedly dilated small bowel loops in the abdomen compatible with  small bowel obstruction. NG tube is in place with the tip in the proximal duodenum. After ingestion of 100 cc Isovue, no visible abnormality noted in the stomach or duodenum. No evidence of contrast extravasation/ leak. Proximal jejunal loops are dilated, and contrast does not pass beyond this point. This study. IMPRESSION: No visible abnormality within the stomach or duodenum. Proximal jejunum is dilated. Recommend follow-up with CT to evaluate small bowel for site of obstruction and possible site of perforation. Electronically Signed   By: Rolm Baptise M.D.   On: 08/22/2016 09:28   Ct Image Guided Drainage By Percutaneous Catheter  Result Date: 08/23/2016 INDICATION: History of diverticular abscess, post right trans gluteal approach percutaneous drainage catheter placement on 08/21/2016. Postprocedural imaging obtained on 08/22/2016 demonstrates interval resolution of lower pelvic diverticular abscess with a persistent air and fluid collection within the anterior aspect of the right lower abdomen/ pelvis. As such, request made for placement of a new percutaneous  drainage catheter. EXAM: CT IMAGE GUIDED DRAINAGE BY PERCUTANEOUS CATHETER COMPARISON:  CT-guided right trans gluteal approach percutaneous drainage catheter placement - 08/21/2016; CT abdomen pelvis - 08/22/2016; 08/20/2016 MEDICATIONS: The patient is currently admitted to the hospital and receiving intravenous antibiotics. The antibiotics were administered within an appropriate time frame prior to the initiation of the procedure. ANESTHESIA/SEDATION: Moderate (conscious) sedation was employed during this procedure. A total of Versed 5 mg and Fentanyl 100 mcg was administered intravenously. Moderate Sedation Time: 18 minutes. The patient's level of consciousness and vital signs were monitored continuously by radiology nursing throughout the procedure under my direct supervision. CONTRAST:  None COMPLICATIONS: None immediate. PROCEDURE: Informed written consent was obtained from the patient after a discussion of the risks, benefits and alternatives to treatment. The patient was placed supine on the CT gantry and a pre procedural CT was performed re-demonstrating the known abscess/fluid collection within the right lower abdomen with dominant component air and fluid containing component measuring approximately 8.8 x 4.6 cm (image 27, series 2). The procedure was planned. A timeout was performed prior to the initiation of the procedure. The skin overlying the anterior aspect the right lower abdomen/pelvis was prepped and draped in the usual sterile fashion. The overlying soft tissues were anesthetized with 1% lidocaine with epinephrine. Appropriate trajectory was planned with the use of a 22 gauge spinal needle. An 18 gauge trocar needle was advanced into the abscess/fluid collection and a short Amplatz super stiff wire was coiled within the collection. Appropriate positioning was confirmed with a limited CT scan. The tract was serially dilated allowing placement of a 10 [place] all-purpose drainage catheter.  Appropriate positioning was confirmed with a limited postprocedural CT scan. Approximately 30 Ml of purulent fluid was aspirated. The tube was connected to a drainage bag and sutured in place. A dressing was placed. The patient tolerated the procedure well without immediate post procedural complication. IMPRESSION: Successful CT guided placement of a 10 French all purpose drain catheter into the residual air and fluid containing abscess within the right lower abdominal quadrant with aspiration of 30 mL of purulent fluid. Samples were sent to the laboratory as requested by the ordering clinical team. Electronically Signed   By: Sandi Mariscal M.D.   On: 08/23/2016 15:58    Anti-infectives: Anti-infectives    Start     Dose/Rate Route Frequency Ordered Stop   08/20/16 1200  meropenem (MERREM) 1 g in sodium chloride 0.9 % 100 mL IVPB     1 g 200 mL/hr over 30 Minutes Intravenous  Every 8 hours 08/20/16 1154        Assessment/Plan: Problem List: Patient Active Problem List   Diagnosis Date Noted  . SBO (small bowel obstruction) 08/21/2016  . Small bowel obstruction 08/20/2016    Cultures are incomplete at this time.  Meropenem for probable diverticulum perforation with pelvic abscess.  * No surgery found *    LOS: 3 days   Matt B. Hassell Done, MD, [facility] [phone] beeper [phone]  08/24/2016 9:14 AM

## 2016-08-24 NOTE — Progress Notes (Signed)
Referring Physician(s): Martin,M  Supervising Physician: Simonne Come  Patient Status:  [facility] - In-pt  Chief Complaint: Pelvic abscesses   Subjective: Patient feeling better today; NG tube out; tolerating liquids okay; has ambulated in hallway   Allergies: Keflex [cephalexin]; Penicillins; and Sulfa antibiotics  Medications: Prior to Admission medications   Medication Sig Start Date End Date Taking? Authorizing Provider  loratadine (CLARITIN) 10 MG tablet Take 10 mg by mouth daily.   Yes Historical Provider, MD  Multiple Vitamin (MULTIVITAMIN WITH MINERALS) TABS tablet Take 1 tablet by mouth daily.   Yes Historical Provider, MD  ondansetron (ZOFRAN-ODT) 8 MG disintegrating tablet Take 1 tablet (8 mg total) by mouth every 8 (eight) hours as needed for nausea. 08/18/16  Yes Elvina Sidle, MD  sucralfate (CARAFATE) 1 GM/10ML suspension Take 10 mLs (1 g total) by mouth 2 (two) times daily. 08/19/16  Yes Salley Scarlet, MD     Vital Signs: BP 129/81 (BP Location: Right Arm)   Pulse 87   Temp 98.2 F (36.8 C) (Oral)   Resp 18   Ht 6\' 2"  (1.88 m)   Wt 235 lb (106.6 kg)   SpO2 98%   BMI 30.17 kg/m   Physical Exam right transgluteal and right lower quadrant drains intact, dressings dry, sites mildly tender to palpation, outputs  67/77 mL respectively; cultures with few staph so far  Imaging: Ct Abdomen Pelvis Wo Contrast  Result Date: 08/22/2016 CLINICAL DATA:  Bowel perforation, post drainage catheter placement on 08/21/2016, having mild lower abdominal pain, passing gas, small bowel movement yesterday EXAM: CT ABDOMEN AND PELVIS WITHOUT CONTRAST TECHNIQUE: Multidetector CT imaging of the abdomen and pelvis was performed following the standard protocol without IV contrast. Sagittal and coronal MPR images reconstructed from axial data set. Patient drank dilute oral contrast for exam. COMPARISON:  08/20/2016 FINDINGS: Lower chest: Bibasilar atelectasis.  Small RIGHT pleural  effusion. Hepatobiliary: Gallbladder and liver unremarkable Pancreas: Normal appearance Spleen: Normal appearance Adrenals/Urinary Tract: Adrenal glands, kidneys, ureters, and decompressed bladder normal appearance. Prostate gland unremarkable. Stomach/Bowel: Dilated proximal small bowel loops which could represent ileus or obstruction. Colon and distal small bowel loops appear decompressed. Few thickened small bowel loops are seen in the mid abdomen. Distal colonic diverticulosis with mild diffuse wall thickening of the sigmoid colon. Uncertain if observed bowel wall thickening is due to enteritis or reactive secondary to peritonitis. Nasogastric tube traverses stomach with tip in duodenal bulb. Stomach otherwise unremarkable. Remaining colon normal appearance. Enlarged appendix containing multiple appendicoliths. No GI contrast extravasation is seen, though the distal small bowel and colon are not yet opacified. Vascular/Lymphatic: Aorta normal caliber.  No adenopathy. Reproductive: N/A Other: Pigtail drainage catheter in the pelvis with decompression of the pelvic collections since the prior study. Small amount of residual free pelvic fluid is identified. In addition, an enlarged collection is seen in the RIGHT pelvis containing air and fluid, 8.3 x 4.8 x 3.9 cm consistent with abscess. Scattered fluid at the pericolic gutters and interloop, none of these as well defined. Persistent free intraperitoneal air. Small umbilical hernia containing fat. Musculoskeletal: Transitional vertebra at the upper sacrum. IMPRESSION: Decreased size of pelvic abscess collection post pigtail drainage catheter placement though an additional RIGHT lateral pelvic abscess collection has significantly increased in size since the prior study now 8.3 x 4.8 x 3.9 cm. Additional scattered free air free fluid throughout abdomen. Bowel wall thickening of distal colon and pelvic small bowel loops may be reactive in the setting of peritonitis  though cannot completely exclude enteritis. Electronically Signed   By: Ulyses SouthwardMark  Boles M.D.   On: 08/22/2016 17:35   Dg Abd Portable 1v  Result Date: 08/20/2016 CLINICAL DATA:  Nasogastric tube placement. EXAM: PORTABLE ABDOMEN - 1 VIEW COMPARISON:  None. FINDINGS: Distal tip of nasogastric tube is seen in proximal stomach. Dilated small bowel loops are noted concerning for distal small bowel obstruction. No colonic dilatation is noted. IMPRESSION: Distal tip of nasogastric tube seen in proximal stomach. Dilated small bowel loops are noted concerning for distal small bowel obstruction. Electronically Signed   By: Lupita RaiderJames  Green Jr, M.D.   On: 08/20/2016 15:17   Dg Kayleen MemosUgi W/water Sol Cm  Result Date: 08/22/2016 CLINICAL DATA:  Follow obstruction, perforation. EXAM: WATER SOLUBLE UPPER GI SERIES TECHNIQUE: Single-column upper GI series was performed using water soluble contrast. CONTRAST:  100 cc Isovue 370 COMPARISON:  CT 08/20/2016 FLUOROSCOPY TIME:  Fluoroscopy Time:  1 minutes 12 seconds Radiation Exposure Index (if provided by the fluoroscopic device): 32 mGy Number of Acquired Spot Images: 1 FINDINGS: Scout image demonstrates abscess drainage catheter projecting over the pelvis. Continued markedly dilated small bowel loops in the abdomen compatible with small bowel obstruction. NG tube is in place with the tip in the proximal duodenum. After ingestion of 100 cc Isovue, no visible abnormality noted in the stomach or duodenum. No evidence of contrast extravasation/ leak. Proximal jejunal loops are dilated, and contrast does not pass beyond this point. This study. IMPRESSION: No visible abnormality within the stomach or duodenum. Proximal jejunum is dilated. Recommend follow-up with CT to evaluate small bowel for site of obstruction and possible site of perforation. Electronically Signed   By: Charlett NoseKevin  Dover M.D.   On: 08/22/2016 09:28   Ct Image Guided Drainage By Percutaneous Catheter  Result Date:  08/23/2016 INDICATION: History of diverticular abscess, post right trans gluteal approach percutaneous drainage catheter placement on 08/21/2016. Postprocedural imaging obtained on 08/22/2016 demonstrates interval resolution of lower pelvic diverticular abscess with a persistent air and fluid collection within the anterior aspect of the right lower abdomen/ pelvis. As such, request made for placement of a new percutaneous drainage catheter. EXAM: CT IMAGE GUIDED DRAINAGE BY PERCUTANEOUS CATHETER COMPARISON:  CT-guided right trans gluteal approach percutaneous drainage catheter placement - 08/21/2016; CT abdomen pelvis - 08/22/2016; 08/20/2016 MEDICATIONS: The patient is currently admitted to the hospital and receiving intravenous antibiotics. The antibiotics were administered within an appropriate time frame prior to the initiation of the procedure. ANESTHESIA/SEDATION: Moderate (conscious) sedation was employed during this procedure. A total of Versed 5 mg and Fentanyl 100 mcg was administered intravenously. Moderate Sedation Time: 18 minutes. The patient's level of consciousness and vital signs were monitored continuously by radiology nursing throughout the procedure under my direct supervision. CONTRAST:  None COMPLICATIONS: None immediate. PROCEDURE: Informed written consent was obtained from the patient after a discussion of the risks, benefits and alternatives to treatment. The patient was placed supine on the CT gantry and a pre procedural CT was performed re-demonstrating the known abscess/fluid collection within the right lower abdomen with dominant component air and fluid containing component measuring approximately 8.8 x 4.6 cm (image 27, series 2). The procedure was planned. A timeout was performed prior to the initiation of the procedure. The skin overlying the anterior aspect the right lower abdomen/pelvis was prepped and draped in the usual sterile fashion. The overlying soft tissues were anesthetized  with 1% lidocaine with epinephrine. Appropriate trajectory was planned with the use of a 22 gauge  spinal needle. An 18 gauge trocar needle was advanced into the abscess/fluid collection and a short Amplatz super stiff wire was coiled within the collection. Appropriate positioning was confirmed with a limited CT scan. The tract was serially dilated allowing placement of a 10 [place] all-purpose drainage catheter. Appropriate positioning was confirmed with a limited postprocedural CT scan. Approximately 30 Ml of purulent fluid was aspirated. The tube was connected to a drainage bag and sutured in place. A dressing was placed. The patient tolerated the procedure well without immediate post procedural complication. IMPRESSION: Successful CT guided placement of a 10 French all purpose drain catheter into the residual air and fluid containing abscess within the right lower abdominal quadrant with aspiration of 30 mL of purulent fluid. Samples were sent to the laboratory as requested by the ordering clinical team. Electronically Signed   By: Simonne Come M.D.   On: 08/23/2016 15:58   Ct Image Guided Drainage By Percutaneous Catheter  Result Date: 08/21/2016 CLINICAL DATA:  Colonic perforation. Loculated cul-de-sac fluid collection possible abscess. EXAM: CT GUIDED DRAINAGE OF PELVIC ABSCESS ANESTHESIA/SEDATION: Intravenous Fentanyl and Versed were administered as conscious sedation during continuous monitoring of the patient's level of consciousness and physiological / cardiorespiratory status by the radiology RN, with a total moderate sedation time of fourteen minutes. PROCEDURE: The procedure, risks, benefits, and alternatives were explained to the patient. Questions regarding the procedure were encouraged and answered. The patient understands and consents to the procedure. Patient placed prone. Select axial scans through the pelvis obtained. The cul-de-sac loculated collection was localized and an appropriate skin  entry site was determined and marked. The operative field was prepped with chlorhexidinein a sterile fashion, and a sterile drape was applied covering the operative field. A sterile gown and sterile gloves were used for the procedure. Local anesthesia was provided with 1% Lidocaine. Under CT fluoroscopic guidance, an 18 gauge trocar needle was advanced into the collection using a right trans gluteal approach. Thin cloudy yellow foul-smelling fluid could be aspirated. An Amplatz guidewire advanced easily within the collection, its position confirmed on CT. Tract was dilated to facilitate placement of a 12 French pigtail catheter, formed centrally within the collection. 35 mL of aspirate were removed and sent for Gram stain and culture. The catheter was secured externally with 0 Prolene suture and StatLock and placed to gravity drain bag. The patient tolerated the procedure well. COMPLICATIONS: None immediate FINDINGS: Loculated cul-de-sac fluid collection was again localized. 12 French pigtail drain catheter placed from right trans gluteal approach as above. IMPRESSION: 1. Technically successful CT-guided pelvic abscess drain catheter placement. Electronically Signed   By: Corlis Leak M.D.   On: 08/21/2016 16:37    Labs:  CBC:  Recent Labs  08/21/16 0455 08/22/16 0747 08/23/16 0639 08/23/16 1112  WBC 8.0 9.7 10.0 11.8*  HGB 13.0 13.3 13.2 14.2  HCT 39.1 38.7* 40.1 40.9  PLT 223 221 255 270    COAGS:  Recent Labs  08/21/16 0455  INR 1.05  APTT 30    BMP:  Recent Labs  08/20/16 0934 08/20/16 1333 08/21/16 0455 08/22/16 0747 08/23/16 0639  NA 137  --  142 141 140  K 3.3*  --  3.3* 3.6 3.4*  CL 100*  --  107 108 104  CO2 25  --  25 24 24   GLUCOSE 114*  --  98 90 84  BUN 28*  --  27* 22* 18  CALCIUM 9.6  --  8.7* 8.4* 8.6*  CREATININE 0.86  0.88 0.74 0.67 0.66  GFRNONAA >60 >60 >60 >60 >60  GFRAA >60 >60 >60 >60 >60    LIVER FUNCTION TESTS:  Recent Labs  08/20/16 0934  08/21/16 0455 08/22/16 0747 08/23/16 0639  BILITOT 2.5* 2.5* 2.6* 2.3*  AST 19 15 18 19   ALT 21 20 27 27   ALKPHOS 48 47 47 49  PROT 7.4 6.4* 5.7* 5.9*  ALBUMIN 3.9 3.0* 3.0* 3.1*    Assessment and Plan: Patient with history of perforated bowel; status post drainage of loculated right cul-de-sac fluid collection on 2/1, drainage of residual right lower quadrant abscess on 2/3; currently afebrile; no new labs today; drain fluid cultures with few staph so far; continue drain irrigation; monitor output closely; obtain follow-up CT latter part of next week; other plans as per [facility]   Electronically Signed: D. Jeananne Rama 08/24/2016, 2:06 PM   I spent a total of 15 minutes at the the patient's bedside AND on the patient's hospital floor or unit, greater than 50% of which was counseling/coordinating care for pelvic abscess drains    Patient ID: [name], male   DOB: 1981/02/24, 36 y.o.   MRN: [id number]

## 2016-08-25 NOTE — Progress Notes (Signed)
Patient ID: [name], male   DOB: November 12, 1980, 36 y.o.   MRN: [id number]  [facility]     Subjective: Feeling much better today. Drain sites are sore but rates abdominal pain at 1/10. Tolerating sips of clears. Denies n/v. Had a BM yesterday.  Objective: Vital signs in last 24 hours: Temp:  [98.5 F (36.9 C)-99.3 F (37.4 C)] 98.5 F (36.9 C) (02/05 0530) Pulse Rate:  [75-92] 92 (02/05 0530) Resp:  [18-20] 18 (02/05 0530) BP: (119-156)/(76-92) 156/92 (02/05 0530) SpO2:  [95 %-97 %] 96 % (02/05 0530) Last BM Date: 08/23/16  Intake/Output from previous day: 02/04 0701 - 02/05 0700 In: 3557.9 [P.O.:240; I.V.:2997.9; IV Piggyback:300] Out: 156 [Emesis/NG output:100; Drains:55; Stool:1] Intake/Output this shift: No intake/output data recorded.  PE: Gen:  Alert, NAD, pleasant Card:  RRR, no M/G/R heard Pulm:  CTAB, no W/R/R, effort normal Abd: Soft, minimal distension, +BS, no HSM, NT, RLQ drain with purulent fluid in bag, transgluteal drain with yellow/serous/minimally cloudy fluid in bag Ext:  No erythema, edema, or tenderness   Lab Results:   Recent Labs  08/23/16 0639 08/23/16 1112  WBC 10.0 11.8*  HGB 13.2 14.2  HCT 40.1 40.9  PLT 255 270   BMET  Recent Labs  08/23/16 0639  NA 140  K 3.4*  CL 104  CO2 24  GLUCOSE 84  BUN 18  CREATININE 0.66  CALCIUM 8.6*   PT/INR No results for input(s): LABPROT, INR in the last 72 hours. CMP     Component Value Date/Time   NA 140 08/23/2016 0639   K 3.4 (L) 08/23/2016 0639   CL 104 08/23/2016 0639   CO2 24 08/23/2016 0639   GLUCOSE 84 08/23/2016 0639   BUN 18 08/23/2016 0639   CREATININE 0.66 08/23/2016 0639   CREATININE 1.03 08/19/2016 1056   CALCIUM 8.6 (L) 08/23/2016 0639   PROT 5.9 (L) 08/23/2016 0639   ALBUMIN 3.1 (L) 08/23/2016 0639   AST 19 08/23/2016 0639   ALT 27 08/23/2016 0639   ALKPHOS 49 08/23/2016 0639   BILITOT 2.3 (H) 08/23/2016 0639   GFRNONAA >60 08/23/2016  0639   GFRAA >60 08/23/2016 0639   Lipase     Component Value Date/Time   LIPASE 13 08/20/2016 0934       Studies/Results: Ct Image Guided Drainage By Percutaneous Catheter  Result Date: 08/23/2016 INDICATION: History of diverticular abscess, post right trans gluteal approach percutaneous drainage catheter placement on 08/21/2016. Postprocedural imaging obtained on 08/22/2016 demonstrates interval resolution of lower pelvic diverticular abscess with a persistent air and fluid collection within the anterior aspect of the right lower abdomen/ pelvis. As such, request made for placement of a new percutaneous drainage catheter. EXAM: CT IMAGE GUIDED DRAINAGE BY PERCUTANEOUS CATHETER COMPARISON:  CT-guided right trans gluteal approach percutaneous drainage catheter placement - 08/21/2016; CT abdomen pelvis - 08/22/2016; 08/20/2016 MEDICATIONS: The patient is currently admitted to the hospital and receiving intravenous antibiotics. The antibiotics were administered within an appropriate time frame prior to the initiation of the procedure. ANESTHESIA/SEDATION: Moderate (conscious) sedation was employed during this procedure. A total of Versed 5 mg and Fentanyl 100 mcg was administered intravenously. Moderate Sedation Time: 18 minutes. The patient's level of consciousness and vital signs were monitored continuously by radiology nursing throughout the procedure under my direct supervision. CONTRAST:  None COMPLICATIONS: None immediate. PROCEDURE: Informed written consent was obtained from the patient after a discussion of the risks, benefits and alternatives to treatment. The patient was  placed supine on the CT gantry and a pre procedural CT was performed re-demonstrating the known abscess/fluid collection within the right lower abdomen with dominant component air and fluid containing component measuring approximately 8.8 x 4.6 cm (image 27, series 2). The procedure was planned. A timeout was performed prior to  the initiation of the procedure. The skin overlying the anterior aspect the right lower abdomen/pelvis was prepped and draped in the usual sterile fashion. The overlying soft tissues were anesthetized with 1% lidocaine with epinephrine. Appropriate trajectory was planned with the use of a 22 gauge spinal needle. An 18 gauge trocar needle was advanced into the abscess/fluid collection and a short Amplatz super stiff wire was coiled within the collection. Appropriate positioning was confirmed with a limited CT scan. The tract was serially dilated allowing placement of a 10 [place] all-purpose drainage catheter. Appropriate positioning was confirmed with a limited postprocedural CT scan. Approximately 30 Ml of purulent fluid was aspirated. The tube was connected to a drainage bag and sutured in place. A dressing was placed. The patient tolerated the procedure well without immediate post procedural complication. IMPRESSION: Successful CT guided placement of a 10 French all purpose drain catheter into the residual air and fluid containing abscess within the right lower abdominal quadrant with aspiration of 30 mL of purulent fluid. Samples were sent to the laboratory as requested by the ordering clinical team. Electronically Signed   By: Simonne Come M.D.   On: 08/23/2016 15:58    Anti-infectives: Anti-infectives    Start     Dose/Rate Route Frequency Ordered Stop   08/20/16 1200  meropenem (MERREM) 1 g in sodium chloride 0.9 % 100 mL IVPB     1 g 200 mL/hr over 30 Minutes Intravenous Every 8 hours 08/20/16 1154         Assessment/Plan SBO with perforation - CT scan shows small bowel obstruction with free air and free fluid in the abdomen and pelvis compatible with bowel perforation, exact source/ site not visualized; colonic diverticulosis no active diverticulitis - s/p IR drain placement 2/1 with 35ml thin purulent output, culture growing staph aureus, report pending - repeat CT 2/2 showed increased size  right lateral pelvic abscess - s/p second IR drain 2/3 with 30 cc of purulent fluid output, culture growing gram negative rods, report pending  Hypokalemia - 3.4 (2/3), recheck in AM Hyperbilirubinemia - 2.3 (2/3), recheck in AM  ID - merrem 1/31>> VTE - SCDs FEN - IVF, clear liquids  Plan - S/p IR drain placements 2/1 and 2/3, follow cultures. Significantly improved today. Advance to clear liquids. Check CBC/CMP in AM   LOS: 4 days    Edson Snowball , [facility] Surgery 08/25/2016, 9:10 AM Pager: [phone] Consults: [phone] Mon-Fri 7:00 am-4:30 pm Sat-Sun 7:00 am-11:30 am

## 2016-08-25 NOTE — Progress Notes (Signed)
Patient ID: [name], male   DOB: September 28, 1980, 36 y.o.   MRN: [id number]    Referring Physician(s): Dr. Phylliss Blakes  Supervising Physician: Irish Lack  Patient Status: [facility] - In-pt  Chief Complaint: Intra-abdominal abscesses  Subjective: Patient feels much better today.  NGT has been removed.  Allergies: Keflex [cephalexin]; Penicillins; and Sulfa antibiotics  Medications: Prior to Admission medications   Medication Sig Start Date End Date Taking? Authorizing Provider  loratadine (CLARITIN) 10 MG tablet Take 10 mg by mouth daily.   Yes Historical Provider, MD  Multiple Vitamin (MULTIVITAMIN WITH MINERALS) TABS tablet Take 1 tablet by mouth daily.   Yes Historical Provider, MD  ondansetron (ZOFRAN-ODT) 8 MG disintegrating tablet Take 1 tablet (8 mg total) by mouth every 8 (eight) hours as needed for nausea. 08/18/16  Yes Elvina Sidle, MD  sucralfate (CARAFATE) 1 GM/10ML suspension Take 10 mLs (1 g total) by mouth 2 (two) times daily. 08/19/16  Yes Salley Scarlet, MD    Vital Signs: BP 127/78 (BP Location: Right Arm)   Pulse (!) 105   Temp 98 F (36.7 C) (Oral)   Resp 19   Ht 6\' 2"  (1.88 m)   Wt 235 lb (106.6 kg)   SpO2 98%   BMI 30.17 kg/m   Physical Exam: Abd: soft, both drains just emptied so no current output.  Both sites are c/d/i  TG with 30cc yesterday and RLQ with 25cc yesterday  Imaging: Ct Abdomen Pelvis Wo Contrast  Result Date: 08/22/2016 CLINICAL DATA:  Bowel perforation, post drainage catheter placement on 08/21/2016, having mild lower abdominal pain, passing gas, small bowel movement yesterday EXAM: CT ABDOMEN AND PELVIS WITHOUT CONTRAST TECHNIQUE: Multidetector CT imaging of the abdomen and pelvis was performed following the standard protocol without IV contrast. Sagittal and coronal MPR images reconstructed from axial data set. Patient drank dilute oral contrast for exam. COMPARISON:  08/20/2016 FINDINGS: Lower chest: Bibasilar atelectasis.   Small RIGHT pleural effusion. Hepatobiliary: Gallbladder and liver unremarkable Pancreas: Normal appearance Spleen: Normal appearance Adrenals/Urinary Tract: Adrenal glands, kidneys, ureters, and decompressed bladder normal appearance. Prostate gland unremarkable. Stomach/Bowel: Dilated proximal small bowel loops which could represent ileus or obstruction. Colon and distal small bowel loops appear decompressed. Few thickened small bowel loops are seen in the mid abdomen. Distal colonic diverticulosis with mild diffuse wall thickening of the sigmoid colon. Uncertain if observed bowel wall thickening is due to enteritis or reactive secondary to peritonitis. Nasogastric tube traverses stomach with tip in duodenal bulb. Stomach otherwise unremarkable. Remaining colon normal appearance. Enlarged appendix containing multiple appendicoliths. No GI contrast extravasation is seen, though the distal small bowel and colon are not yet opacified. Vascular/Lymphatic: Aorta normal caliber.  No adenopathy. Reproductive: N/A Other: Pigtail drainage catheter in the pelvis with decompression of the pelvic collections since the prior study. Small amount of residual free pelvic fluid is identified. In addition, an enlarged collection is seen in the RIGHT pelvis containing air and fluid, 8.3 x 4.8 x 3.9 cm consistent with abscess. Scattered fluid at the pericolic gutters and interloop, none of these as well defined. Persistent free intraperitoneal air. Small umbilical hernia containing fat. Musculoskeletal: Transitional vertebra at the upper sacrum. IMPRESSION: Decreased size of pelvic abscess collection post pigtail drainage catheter placement though an additional RIGHT lateral pelvic abscess collection has significantly increased in size since the prior study now 8.3 x 4.8 x 3.9 cm. Additional scattered free air free fluid throughout abdomen. Bowel wall thickening of distal colon and pelvic small  bowel loops may be reactive in the  setting of peritonitis though cannot completely exclude enteritis. Electronically Signed   By: Ulyses Southward M.D.   On: 08/22/2016 17:35   Dg Kayleen Memos W/water Sol Cm  Result Date: 08/22/2016 CLINICAL DATA:  Follow obstruction, perforation. EXAM: WATER SOLUBLE UPPER GI SERIES TECHNIQUE: Single-column upper GI series was performed using water soluble contrast. CONTRAST:  100 cc Isovue 370 COMPARISON:  CT 08/20/2016 FLUOROSCOPY TIME:  Fluoroscopy Time:  1 minutes 12 seconds Radiation Exposure Index (if provided by the fluoroscopic device): 32 mGy Number of Acquired Spot Images: 1 FINDINGS: Scout image demonstrates abscess drainage catheter projecting over the pelvis. Continued markedly dilated small bowel loops in the abdomen compatible with small bowel obstruction. NG tube is in place with the tip in the proximal duodenum. After ingestion of 100 cc Isovue, no visible abnormality noted in the stomach or duodenum. No evidence of contrast extravasation/ leak. Proximal jejunal loops are dilated, and contrast does not pass beyond this point. This study. IMPRESSION: No visible abnormality within the stomach or duodenum. Proximal jejunum is dilated. Recommend follow-up with CT to evaluate small bowel for site of obstruction and possible site of perforation. Electronically Signed   By: Charlett Nose M.D.   On: 08/22/2016 09:28   Ct Image Guided Drainage By Percutaneous Catheter  Result Date: 08/23/2016 INDICATION: History of diverticular abscess, post right trans gluteal approach percutaneous drainage catheter placement on 08/21/2016. Postprocedural imaging obtained on 08/22/2016 demonstrates interval resolution of lower pelvic diverticular abscess with a persistent air and fluid collection within the anterior aspect of the right lower abdomen/ pelvis. As such, request made for placement of a new percutaneous drainage catheter. EXAM: CT IMAGE GUIDED DRAINAGE BY PERCUTANEOUS CATHETER COMPARISON:  CT-guided right trans gluteal  approach percutaneous drainage catheter placement - 08/21/2016; CT abdomen pelvis - 08/22/2016; 08/20/2016 MEDICATIONS: The patient is currently admitted to the hospital and receiving intravenous antibiotics. The antibiotics were administered within an appropriate time frame prior to the initiation of the procedure. ANESTHESIA/SEDATION: Moderate (conscious) sedation was employed during this procedure. A total of Versed 5 mg and Fentanyl 100 mcg was administered intravenously. Moderate Sedation Time: 18 minutes. The patient's level of consciousness and vital signs were monitored continuously by radiology nursing throughout the procedure under my direct supervision. CONTRAST:  None COMPLICATIONS: None immediate. PROCEDURE: Informed written consent was obtained from the patient after a discussion of the risks, benefits and alternatives to treatment. The patient was placed supine on the CT gantry and a pre procedural CT was performed re-demonstrating the known abscess/fluid collection within the right lower abdomen with dominant component air and fluid containing component measuring approximately 8.8 x 4.6 cm (image 27, series 2). The procedure was planned. A timeout was performed prior to the initiation of the procedure. The skin overlying the anterior aspect the right lower abdomen/pelvis was prepped and draped in the usual sterile fashion. The overlying soft tissues were anesthetized with 1% lidocaine with epinephrine. Appropriate trajectory was planned with the use of a 22 gauge spinal needle. An 18 gauge trocar needle was advanced into the abscess/fluid collection and a short Amplatz super stiff wire was coiled within the collection. Appropriate positioning was confirmed with a limited CT scan. The tract was serially dilated allowing placement of a 10 [place] all-purpose drainage catheter. Appropriate positioning was confirmed with a limited postprocedural CT scan. Approximately 30 Ml of purulent fluid was  aspirated. The tube was connected to a drainage bag and sutured in place. A  dressing was placed. The patient tolerated the procedure well without immediate post procedural complication. IMPRESSION: Successful CT guided placement of a 10 French all purpose drain catheter into the residual air and fluid containing abscess within the right lower abdominal quadrant with aspiration of 30 mL of purulent fluid. Samples were sent to the laboratory as requested by the ordering clinical team. Electronically Signed   By: Simonne ComeJohn  Watts M.D.   On: 08/23/2016 15:58    Labs:  CBC:  Recent Labs  08/21/16 0455 08/22/16 0747 08/23/16 0639 08/23/16 1112  WBC 8.0 9.7 10.0 11.8*  HGB 13.0 13.3 13.2 14.2  HCT 39.1 38.7* 40.1 40.9  PLT 223 221 255 270    COAGS:  Recent Labs  08/21/16 0455  INR 1.05  APTT 30    BMP:  Recent Labs  08/20/16 0934 08/20/16 1333 08/21/16 0455 08/22/16 0747 08/23/16 0639  NA 137  --  142 141 140  K 3.3*  --  3.3* 3.6 3.4*  CL 100*  --  107 108 104  CO2 25  --  25 24 24   GLUCOSE 114*  --  98 90 84  BUN 28*  --  27* 22* 18  CALCIUM 9.6  --  8.7* 8.4* 8.6*  CREATININE 0.86 0.88 0.74 0.67 0.66  GFRNONAA >60 >60 >60 >60 >60  GFRAA >60 >60 >60 >60 >60    LIVER FUNCTION TESTS:  Recent Labs  08/20/16 0934 08/21/16 0455 08/22/16 0747 08/23/16 0639  BILITOT 2.5* 2.5* 2.6* 2.3*  AST 19 15 18 19   ALT 21 20 27 27   ALKPHOS 48 47 47 49  PROT 7.4 6.4* 5.7* 5.9*  ALBUMIN 3.9 3.0* 3.0* 3.1*    Assessment and Plan: 1. S/p TG drain placement on 2/1 and RLQ on 2/3 for intra-abdominal abscesses -both drains still with output over 15cc a day.  Continue both drains for now with routine care and flushes. -repeat CT towards the end of the week or when output is less than 10-15cc/day.  If he has been discharged prior to this, we will arrange for follow up in the drain clinic as an outpatient. -on meropenum for staph in drain, RLQ cx with gram - rods.  Electronically  Signed: Letha CapeSBORNE,Annel Zunker E 08/25/2016, 3:25 PM   I spent a total of 15 Minutes at the the patient's bedside AND on the patient's hospital floor or unit, greater than 50% of which was counseling/coordinating care for intra-abdominal abscesses

## 2016-08-26 ENCOUNTER — Inpatient Hospital Stay (HOSPITAL_COMMUNITY): Payer: BLUE CROSS/BLUE SHIELD

## 2016-08-26 LAB — COMPREHENSIVE METABOLIC PANEL
ALT: 31 U/L (ref 17–63)
ANION GAP: 8 (ref 5–15)
AST: 19 U/L (ref 15–41)
Albumin: 2.9 g/dL — ABNORMAL LOW (ref 3.5–5.0)
Alkaline Phosphatase: 44 U/L (ref 38–126)
BILIRUBIN TOTAL: 1.4 mg/dL — AB (ref 0.3–1.2)
BUN: 6 mg/dL (ref 6–20)
CO2: 25 mmol/L (ref 22–32)
Calcium: 8.2 mg/dL — ABNORMAL LOW (ref 8.9–10.3)
Chloride: 105 mmol/L (ref 101–111)
Creatinine, Ser: 0.56 mg/dL — ABNORMAL LOW (ref 0.61–1.24)
GFR calc Af Amer: >60 mL/min (ref >60)
Glucose, Bld: 94 mg/dL (ref 65–99)
POTASSIUM: 3.6 mmol/L (ref 3.5–5.1)
Sodium: 138 mmol/L (ref 135–145)
Total Protein: 5.5 g/dL — ABNORMAL LOW (ref 6.5–8.1)

## 2016-08-26 LAB — CBC
HEMATOCRIT: 36.8 % — AB (ref 39.0–52.0)
Hemoglobin: 12.8 g/dL — ABNORMAL LOW (ref 13.0–17.0)
MCH: 31.3 pg (ref 26.0–34.0)
MCHC: 34.8 g/dL (ref 30.0–36.0)
MCV: 90 fL (ref 78.0–100.0)
PLATELETS: 267 10*3/uL (ref 150–400)
RBC: 4.09 MIL/uL — ABNORMAL LOW (ref 4.22–5.81)
RDW: 12.5 % (ref 11.5–15.5)
WBC: 6.7 10*3/uL (ref 4.0–10.5)

## 2016-08-26 MED ORDER — SODIUM CHLORIDE 0.9 % IJ SOLN
INTRAMUSCULAR | Status: AC
  Filled 2016-08-26: qty 50

## 2016-08-26 MED ORDER — IOPAMIDOL (ISOVUE-300) INJECTION 61%
INTRAVENOUS | Status: AC
  Filled 2016-08-26: qty 100

## 2016-08-26 MED ORDER — IOPAMIDOL (ISOVUE-300) INJECTION 61%
100.0000 mL | Freq: Once | INTRAVENOUS | Status: AC | PRN
  Administered 2016-08-26: 100 mL via INTRAVENOUS

## 2016-08-26 NOTE — Progress Notes (Signed)
Patient ID: [name], male   DOB: Oct 07, 1980, 36 y.o.   MRN: [id number]  [facility] Surgery Progress Note     Subjective: Reports little to no abdominal pain this morning, just soreness from drains. Tolerating full liquids. Feels hungry. Had a BM this morning.  Objective: Vital signs in last 24 hours: Temp:  [98 F (36.7 C)-98.4 F (36.9 C)] 98 F (36.7 C) (02/06 0539) Pulse Rate:  [88-105] 89 (02/06 0539) Resp:  [18-19] 18 (02/06 0539) BP: (109-127)/(60-78) 127/77 (02/06 0539) SpO2:  [96 %-98 %] 98 % (02/06 0539) Last BM Date: 08/23/16  Intake/Output from previous day: 02/05 0701 - 02/06 0700 In: 4062.1 [P.O.:840; I.V.:3002.1; IV Piggyback:200] Out: 1380 [Urine:1350; Drains:30] Intake/Output this shift: No intake/output data recorded.  PE: Gen: Alert, NAD, pleasant Card: RRR, no M/G/R heard Pulm: CTAB, no W/R/R, effort normal Abd: Soft, minimal distension, +BS, no HSM, NT, RLQ drain with purulent fluid in bag, transgluteal drain with yellow/serous/minimally cloudy fluid in bag Ext: No erythema, edema, or tenderness    Lab Results:   Recent Labs  08/23/16 1112 08/26/16 0504  WBC 11.8* 6.7  HGB 14.2 12.8*  HCT 40.9 36.8*  PLT 270 267   BMET  Recent Labs  08/26/16 0504  NA 138  K 3.6  CL 105  CO2 25  GLUCOSE 94  BUN 6  CREATININE 0.56*  CALCIUM 8.2*   PT/INR No results for input(s): LABPROT, INR in the last 72 hours. CMP     Component Value Date/Time   NA 138 08/26/2016 0504   K 3.6 08/26/2016 0504   CL 105 08/26/2016 0504   CO2 25 08/26/2016 0504   GLUCOSE 94 08/26/2016 0504   BUN 6 08/26/2016 0504   CREATININE 0.56 (L) 08/26/2016 0504   CREATININE 1.03 08/19/2016 1056   CALCIUM 8.2 (L) 08/26/2016 0504   PROT 5.5 (L) 08/26/2016 0504   ALBUMIN 2.9 (L) 08/26/2016 0504   AST 19 08/26/2016 0504   ALT 31 08/26/2016 0504   ALKPHOS 44 08/26/2016 0504   BILITOT 1.4 (H) 08/26/2016 0504   GFRNONAA >60 08/26/2016 0504   GFRAA >60  08/26/2016 0504   Lipase     Component Value Date/Time   LIPASE 13 08/20/2016 0934       Studies/Results: No results found.  Anti-infectives: Anti-infectives    Start     Dose/Rate Route Frequency Ordered Stop   08/20/16 1200  meropenem (MERREM) 1 g in sodium chloride 0.9 % 100 mL IVPB     1 g 200 mL/hr over 30 Minutes Intravenous Every 8 hours 08/20/16 1154         Assessment/Plan SBO with perforation - CT scan shows small bowel obstruction with free air and free fluid in the abdomen and pelvis compatible with bowel perforation, exact source/ site not visualized; colonic diverticulosis no active diverticulitis - s/p IR drain placement 2/1 with 35ml thin purulent output, culture growing staph aureus, report pending - repeat CT 2/2 showed increased size right lateral pelvic abscess - s/p second IR drain 2/3 with 30 cc of purulent fluid output, culture growing E coli Susceptibility    Escherichia coli    MIC    AMPICILLIN 16 INTERMED... Intermediate    AMPICILLIN/SULBACTAM 4 SENSITIVE  Sensitive    CEFAZOLIN <=4 SENSITIVE "><=4 SENSITIVE  Sensitive    CEFEPIME <=1 SENSITIVE "><=1 SENSITIVE  Sensitive    CEFTAZIDIME <=1 SENSITIVE "><=1 SENSITIVE  Sensitive    CEFTRIAXONE <=1 SENSITIVE "><=1 SENSITIVE  Sensitive  CIPROFLOXACIN <=0.25 SENSITIVE "><=0.25 SENS... Sensitive    Extended ESBL NEGATIVE  Sensitive    GENTAMICIN <=1 SENSITIVE "><=1 SENSITIVE  Sensitive    IMIPENEM <=0.25 SENSITIVE "><=0.25 SENS... Sensitive    PIP/TAZO <=4 SENSITIVE "><=4 SENSITIVE  Sensitive    TRIMETH/SULFA <=20 SENSITIVE "><=20 SENSIT... Sensitive      Hypokalemia - resolved Hyperbilirubinemia - trending down, 1.4 this AM  ID - merrem 1/31>> VTE - SCDs FEN - IVF, soft diet  Plan - Drain with minimizing output, WBC WNL today, and patient's pain improving. advance to soft diet. Plan for CT tomorrow to assess adequacy of abscess drainage. Continue IV antibiotics. CBC in Am. Follow  cultures.   LOS: 5 days    Edson SnowballBROOKE A MILLER , [facility] 08/26/2016, 10:03 AM Pager: [phone] Consults: [phone] Mon-Fri 7:00 am-4:30 pm Sat-Sun 7:00 am-11:30 am

## 2016-08-27 ENCOUNTER — Other Ambulatory Visit: Payer: Self-pay | Admitting: Radiology

## 2016-08-27 DIAGNOSIS — Z8719 Personal history of other diseases of the digestive system: Secondary | ICD-10-CM

## 2016-08-27 MED ORDER — SODIUM CHLORIDE FLUSH 0.9 % IV SOLN: INTRAVENOUS | 3 refills | Status: DC

## 2016-08-27 MED ORDER — FLUCONAZOLE 100 MG PO TABS: ORAL_TABLET | ORAL | 0 refills | Status: DC

## 2016-08-27 MED ORDER — METRONIDAZOLE 500 MG PO TABS: 500.0000 mg | ORAL_TABLET | Freq: Three times a day (TID) | ORAL | 0 refills | Status: AC

## 2016-08-27 MED ORDER — CIPROFLOXACIN HCL 500 MG PO TABS: 500.0000 mg | ORAL_TABLET | Freq: Two times a day (BID) | ORAL | 0 refills | Status: AC

## 2016-08-27 NOTE — Discharge Summary (Signed)
[facility] Discharge Summary   Patient ID: [name] MRN: [id number] DOB/AGE: Apr 18, 1981 35 y.o.  Admit date: 08/20/2016 Discharge date: 08/27/2016  Admitting Diagnosis: Perforated viscous Small bowel obstruction  Discharge Diagnosis Patient Active Problem List   Diagnosis Date Noted  . SBO (small bowel obstruction) 08/21/2016  . Small bowel obstruction 08/20/2016    Consultants Oley Balm MD - interventional radiology  Imaging: Ct Abdomen Pelvis W Contrast  Result Date: 08/26/2016 CLINICAL DATA:  36 year old male with bowel perforation and abscess treated with catheter drainage (08/21/2016 and 08/22/2016). Subsequent encounter. EXAM: CT ABDOMEN AND PELVIS WITH CONTRAST TECHNIQUE: Multidetector CT imaging of the abdomen and pelvis was performed using the standard protocol following bolus administration of intravenous contrast. CONTRAST:  ISOVUE-300 IOPAMIDOL (ISOVUE-300) INJECTION 61% COMPARISON:  Two most recent exams are from 08/23/2016 and 08/22/2016. FINDINGS: Lower chest: Bibasilar atelectasis with small right-sided pleural effusion. Hepatobiliary: No worrisome hepatic lesion or calcified gallstone. Pancreas: No pancreatic mass. Spleen: No splenic mass. Adrenals/Urinary Tract: No renal or adrenal mass. No hydronephrosis. Stomach/Bowel: Colonic diverticula advanced for patient's age. Difficult to exclude inflammation given the surrounding findings. Small appendix contains stones. No gastric abnormality noted. Fluid and gas-filled top-normal size small bowel loops may be reactive in origin. Vascular/Lymphatic: No abdominal aortic aneurysm or adenopathy. Reproductive: No worrisome abnormality. Other: 2 drains are in place within the pelvis (right gluteal approach and anterior right pelvic wall approach), with decrease in size of abscess collection. Abscess collection which remains spans over 6.9 cm (right-to-left) with maximal thickness on the left measuring 2 x  2.3 cm. Catheters communicate with the residual abscess collection. No free intraperitoneal air. Musculoskeletal: No acute abnormality. IMPRESSION: 2 drains are in place within the pelvis with decrease in size of abscess collection. Abscess collection which remains spans over 6.9 cm (right-to-left) with maximal thickness on the left measuring 2 x 2.3 cm. Catheters communicate with the residual abscess collection. Colonic diverticula advanced for patient's age. Difficult to exclude inflammation given the surrounding findings. Small appendix contains stones. Fluid and gas-filled top-normal size small bowel loops may be reactive in origin. Bibasilar atelectasis with small right-sided pleural effusion. Electronically Signed   By: Lacy Duverney M.D.   On: 08/26/2016 15:45    Procedures Dr. Deanne Coffer (08/21/16) - IR drainage of abdominal abscess Dr. Grace Isaac (08/23/16) - IR drainage of RLQ abdominal abscess  Hospital Course:  [name] is a 36yo male who [facility] to [facility] 08/20/16 with 4 days of abdominal pain, nausea, vomiting, and diarrhea. He had seen urgent care twice where he received IVF and antiemetics, but he did not improve Workup included a CT scan which showed free fluid in the pelvis and pneumoperitoneum with unclear source. He was not septic and exam was nearly benign.  Patient was admitted for observation and IV antibiotics. IR was consulted and drained the fluid collection. UGI was performed 2/2 and showed no foregut abnormalities. Abdominal exam remained benign and VSS. Small bowel follow through with CT showed a new right sided pelvic collection. IR consulted again for drainage. Patient continued to improve and his diet was advanced. Culture from drainage fluid grew E coli and Enterococcus faecalis, and staph aureus from the the other drain. Patient was transitioned to oral cipro/flagyl. On 2/7 the patient was voiding well, tolerating diet, ambulating well, pain well controlled, vital signs stable  and felt stable for discharge home.  He will go home with both drains and follow-up with drain clinic in 2 weeks. He will be on  cipro/flagyl x10 days. Follow-up with Dr. Fredricka Bonineonnor in 2 weeks. He knows to call with questions or concerns.      Physical Exam: Gen: Alert, NAD, pleasant Card: RRR, no M/G/R heard Pulm: CTAB, no W/R/R, effort normal Abd: Soft, ND, +BS, no HSM, NT, RLQ drain with purulent fluid in bag, transgluteal drain with yellow/serous/minimally cloudy fluid in bag Ext: No erythema, edema, or tenderness   Allergies as of 08/27/2016      Reactions   Keflex [cephalexin] Anaphylaxis, Rash   Penicillins Other (See Comments)   Childhood Has patient had a PCN reaction causing immediate rash, facial/tongue/throat swelling, SOB or lightheadedness with hypotension: uknown Has patient had a PCN reaction causing severe rash involving mucus membranes or skin necrosis: unknown Has patient had a PCN reaction that required hospitalization unknown Has patient had a PCN reaction occurring within the last 10 years: uknown If all of the above answers are "NO", then may proceed with Cephalosporin use.   Sulfa Antibiotics Other (See Comments)   Unknown. Childhood Allergy      Medication List    TAKE these medications   ciprofloxacin 500 MG tablet Commonly known as:  CIPRO Take 1 tablet (500 mg total) by mouth 2 (two) times daily.   fluconazole 100 MG tablet Commonly known as:  DIFLUCAN Take 1 (one) tablet once if needed for fungal infection. If it does not resolve in 72 hours take the second tablet   loratadine 10 MG tablet Commonly known as:  CLARITIN Take 10 mg by mouth daily.   metroNIDAZOLE 500 MG tablet Commonly known as:  FLAGYL Take 1 tablet (500 mg total) by mouth 3 (three) times daily.   multivitamin with minerals Tabs tablet Take 1 tablet by mouth daily.   ondansetron 8 MG disintegrating tablet Commonly known as:  ZOFRAN-ODT Take 1 tablet (8 mg total) by mouth every 8  (eight) hours as needed for nausea.   sodium chloride flush 0.9 % Soln injection Flush each daily once daily with 5cc normal saline   sucralfate 1 GM/10ML suspension Commonly known as:  CARAFATE Take 10 mLs (1 g total) by mouth 2 (two) times daily.        Follow-up Information    Berna Buehelsea A Connor, MD. Schedule an appointment as soon as possible for a visit in 2 week(s).   Specialty:  General Surgery Why:  Please call as soon as you leave the hospital to make a follow-up appointment with Dr. Fredricka Bonineonnor in 2 weeks Contact information: [phone]           Signed: Edson SnowballBROOKE A MILLER, [facility] [place] 08/27/2016, 3:32 PM Pager: [phone] Consults: [phone] Mon-Fri 7:00 am-4:30 pm Sat-Sun 7:00 am-11:30 am

## 2016-08-27 NOTE — Progress Notes (Signed)
Patient discharged home with no complaints of pain. Discharge instructions given for drain care;Rxs given for Flagyl, Cipro, and Diflucan; patient verbalized understanding.

## 2016-08-27 NOTE — Progress Notes (Signed)
Patient ID: [name], male   DOB: [birth year], 36 y.o.   MRN: [id number]  [facility]     Subjective: Feels great today. Has been up since 5am. Ambulated multiple times. Tolerating diet. Ready to go home. Repeat CT scan last night.  Objective: Vital signs in last 24 hours: Temp:  [98.1 F (36.7 C)-99.1 F (37.3 C)] 98.1 F (36.7 C) (02/07 0524) Pulse Rate:  [88-104] 100 (02/07 0524) Resp:  [18] 18 (02/07 0524) BP: (116-126)/(68-80) 116/80 (02/07 0524) SpO2:  [95 %-100 %] 95 % (02/07 0524) Last BM Date: 08/23/16  Intake/Output from previous day: 02/06 0701 - 02/07 0700 In: 4600 [P.O.:1380; I.V.:3000; IV Piggyback:200] Out: 1120 [Urine:1050; Drains:70] Intake/Output this shift: No intake/output data recorded.  PE: Gen: Alert, NAD, pleasant Card: RRR, no M/G/R heard Pulm: CTAB, no W/R/R, effort normal Abd: Soft, ND, +BS, no HSM, NT, RLQ drain with purulent fluid in bag, transgluteal drain with yellow/serous/minimally cloudy fluid in bag Ext: No erythema, edema, or tenderness    Lab Results:   Recent Labs  08/26/16 0504  WBC 6.7  HGB 12.8*  HCT 36.8*  PLT 267   BMET  Recent Labs  08/26/16 0504  NA 138  K 3.6  CL 105  CO2 25  GLUCOSE 94  BUN 6  CREATININE 0.56*  CALCIUM 8.2*   PT/INR No results for input(s): LABPROT, INR in the last 72 hours. CMP     Component Value Date/Time   NA 138 08/26/2016 0504   K 3.6 08/26/2016 0504   CL 105 08/26/2016 0504   CO2 25 08/26/2016 0504   GLUCOSE 94 08/26/2016 0504   BUN 6 08/26/2016 0504   CREATININE 0.56 (L) 08/26/2016 0504   CREATININE 1.03 08/19/2016 1056   CALCIUM 8.2 (L) 08/26/2016 0504   PROT 5.5 (L) 08/26/2016 0504   ALBUMIN 2.9 (L) 08/26/2016 0504   AST 19 08/26/2016 0504   ALT 31 08/26/2016 0504   ALKPHOS 44 08/26/2016 0504   BILITOT 1.4 (H) 08/26/2016 0504   GFRNONAA >60 08/26/2016 0504   GFRAA >60 08/26/2016 0504   Lipase     Component Value Date/Time    LIPASE 13 08/20/2016 0934       Studies/Results: Ct Abdomen Pelvis W Contrast  Result Date: 08/26/2016 CLINICAL DATA:  36 year old male with bowel perforation and abscess treated with catheter drainage (08/21/2016 and 08/22/2016). Subsequent encounter. EXAM: CT ABDOMEN AND PELVIS WITH CONTRAST TECHNIQUE: Multidetector CT imaging of the abdomen and pelvis was performed using the standard protocol following bolus administration of intravenous contrast. CONTRAST:  100mL ISOVUE-300 IOPAMIDOL (ISOVUE-300) INJECTION 61% COMPARISON:  Two most recent exams are from 08/23/2016 and 08/22/2016. FINDINGS: Lower chest: Bibasilar atelectasis with small right-sided pleural effusion. Hepatobiliary: No worrisome hepatic lesion or calcified gallstone. Pancreas: No pancreatic mass. Spleen: No splenic mass. Adrenals/Urinary Tract: No renal or adrenal mass. No hydronephrosis. Stomach/Bowel: Colonic diverticula advanced for patient's age. Difficult to exclude inflammation given the surrounding findings. Small appendix contains stones. No gastric abnormality noted. Fluid and gas-filled top-normal size small bowel loops may be reactive in origin. Vascular/Lymphatic: No abdominal aortic aneurysm or adenopathy. Reproductive: No worrisome abnormality. Other: 2 drains are in place within the pelvis (right gluteal approach and anterior right pelvic wall approach), with decrease in size of abscess collection. Abscess collection which remains spans over 6.9 cm (right-to-left) with maximal thickness on the left measuring 2 x 2.3 cm. Catheters communicate with the residual abscess collection. No free intraperitoneal air. Musculoskeletal: No acute  abnormality. IMPRESSION: 2 drains are in place within the pelvis with decrease in size of abscess collection. Abscess collection which remains spans over 6.9 cm (right-to-left) with maximal thickness on the left measuring 2 x 2.3 cm. Catheters communicate with the residual abscess collection.  Colonic diverticula advanced for patient's age. Difficult to exclude inflammation given the surrounding findings. Small appendix contains stones. Fluid and gas-filled top-normal size small bowel loops may be reactive in origin. Bibasilar atelectasis with small right-sided pleural effusion. Electronically Signed   By: Lacy Duverney M.D.   On: 08/26/2016 15:45    Anti-infectives: Anti-infectives    Start     Dose/Rate Route Frequency Ordered Stop   08/20/16 1200  meropenem (MERREM) 1 g in sodium chloride 0.9 % 100 mL IVPB     1 g 200 mL/hr over 30 Minutes Intravenous Every 8 hours 08/20/16 1154         Assessment/Plan SBO with perforation - CT scan shows small bowel obstruction with free air and free fluid in the abdomen and pelvis compatible with bowel perforation, exact source/ site not visualized; colonic diverticulosis no active diverticulitis - s/p IR drain placement 2/1 with 35ml thin purulent output, culture growing staph aureus, report pending - repeat CT 2/2 showed increased size right lateral pelvic abscess - s/p second IR drain 2/3 with 30 cc of purulent fluid output, culture growing E coli and Enterococcus faecalis - Repeat CT 2/6 shows decreasing size of abscess collections  Hypokalemia - resolved Hyperbilirubinemia - trending down  ID - merrem 1/31>>2/7, cipro/flagyl 2/7>> VTE - SCDs FEN - soft diet  Plan - Patient is ready for discharge. He will be sent home with both drains and follow-up in drain clinic next week. He will be on cipro/flagyl x10 days, and follow-up with Dr. Fredricka Bonine in about 2 weeks.   LOS: 6 days    Edson Snowball , [facility] 08/27/2016, 9:19 AM Pager: [phone] Consults: [phone] Mon-Fri 7:00 am-4:30 pm Sat-Sun 7:00 am-11:30 am

## 2016-08-27 NOTE — Discharge Instructions (Signed)
Please take your antibiotics (ciprofloxacin and flagyl) to completion Flush each drain daily with 5cc normal saline, and follow up with drain clinic as scheduled Continue to walk daily If you start to experience increased abdominal pain, fever, chills, nausea, or vomiting call us or proceed to the Emergency room Follow up in about 2 weeks with Dr. Fredricka Bonineonnor

## 2016-08-27 NOTE — Progress Notes (Signed)
Referring Physician(s): Connor,C  Supervising Physician: Gilmer MorWagner, Jaime  [facility]  Chief Complaint:  Pelvic abscesses  Subjective:  Pt to be discharged home today; doing well; still has some soreness at drain sites but much better since drains placed; denies N/V; tol diet  Allergies: Keflex [cephalexin]; Penicillins; and Sulfa antibiotics  Medications: Prior to Admission medications   Medication Sig Start Date End Date Taking? Authorizing Provider  loratadine (CLARITIN) 10 MG tablet Take 10 mg by mouth daily.   Yes Historical Provider, MD  Multiple Vitamin (MULTIVITAMIN WITH MINERALS) TABS tablet Take 1 tablet by mouth daily.   Yes Historical Provider, MD  ondansetron (ZOFRAN-ODT) 8 MG disintegrating tablet Take 1 tablet (8 mg total) by mouth every 8 (eight) hours as needed for nausea. 08/18/16  Yes Elvina SidleKurt Lauenstein, MD  sucralfate (CARAFATE) 1 GM/10ML suspension Take 10 mLs (1 g total) by mouth 2 (two) times daily. 08/19/16  Yes Salley ScarletKawanta F Bixby, MD  ciprofloxacin (CIPRO) 500 MG tablet Take 1 tablet (500 mg total) by mouth 2 (two) times daily. 08/27/16 09/06/16  Edson SnowballBrooke A Miller, PA-C  fluconazole (DIFLUCAN) 100 MG tablet Take 1 (one) tablet once if needed for fungal infection. If it does not resolve in 72 hours take the second tablet 08/27/16   Edson SnowballBrooke A Miller, PA-C  metroNIDAZOLE (FLAGYL) 500 MG tablet Take 1 tablet (500 mg total) by mouth 3 (three) times daily. 08/27/16 09/06/16  Edson SnowballBrooke A Miller, PA-C  sodium chloride flush 0.9 % SOLN injection Flush each daily once daily with 5cc normal saline 08/27/16   Edson SnowballBrooke A Miller, PA-C     Vital Signs: BP 116/80 (BP Location: Left Arm)   Pulse 100   Temp 98.1 F (36.7 C) (Oral)   Resp 18   Ht 6\' 2"  (1.88 m)   Wt 235 lb (106.6 kg)   SpO2 95%   BMI 30.17 kg/m   Physical Exam awake/alert; rt TG/RLQ drains intact, insertion sites ok, mildly tender; abd soft, outputs 30-40 cc each; cx's- e coli, enterococcus,  staph  Imaging: Ct Abdomen Pelvis W Contrast  Result Date: 08/26/2016 CLINICAL DATA:  36 year old male with bowel perforation and abscess treated with catheter drainage (08/21/2016 and 08/22/2016). Subsequent encounter. EXAM: CT ABDOMEN AND PELVIS WITH CONTRAST TECHNIQUE: Multidetector CT imaging of the abdomen and pelvis was performed using the standard protocol following bolus administration of intravenous contrast. CONTRAST:  100mL ISOVUE-300 IOPAMIDOL (ISOVUE-300) INJECTION 61% COMPARISON:  Two most recent exams are from 08/23/2016 and 08/22/2016. FINDINGS: Lower chest: Bibasilar atelectasis with small right-sided pleural effusion. Hepatobiliary: No worrisome hepatic lesion or calcified gallstone. Pancreas: No pancreatic mass. Spleen: No splenic mass. Adrenals/Urinary Tract: No renal or adrenal mass. No hydronephrosis. Stomach/Bowel: Colonic diverticula advanced for patient's age. Difficult to exclude inflammation given the surrounding findings. Small appendix contains stones. No gastric abnormality noted. Fluid and gas-filled top-normal size small bowel loops may be reactive in origin. Vascular/Lymphatic: No abdominal aortic aneurysm or adenopathy. Reproductive: No worrisome abnormality. Other: 2 drains are in place within the pelvis (right gluteal approach and anterior right pelvic wall approach), with decrease in size of abscess collection. Abscess collection which remains spans over 6.9 cm (right-to-left) with maximal thickness on the left measuring 2 x 2.3 cm. Catheters communicate with the residual abscess collection. No free intraperitoneal air. Musculoskeletal: No acute abnormality. IMPRESSION: 2 drains are in place within the pelvis with decrease in size of abscess collection. Abscess collection which remains spans over 6.9 cm (right-to-left) with maximal  thickness on the left measuring 2 x 2.3 cm. Catheters communicate with the residual abscess collection. Colonic diverticula advanced for patient's  age. Difficult to exclude inflammation given the surrounding findings. Small appendix contains stones. Fluid and gas-filled top-normal size small bowel loops may be reactive in origin. Bibasilar atelectasis with small right-sided pleural effusion. Electronically Signed   By: Lacy Duverney M.D.   On: 08/26/2016 15:45   Ct Image Guided Drainage By Percutaneous Catheter  Result Date: 08/23/2016 INDICATION: History of diverticular abscess, post right trans gluteal approach percutaneous drainage catheter placement on 08/21/2016. Postprocedural imaging obtained on 08/22/2016 demonstrates interval resolution of lower pelvic diverticular abscess with a persistent air and fluid collection within the anterior aspect of the right lower abdomen/ pelvis. As such, request made for placement of a new percutaneous drainage catheter. EXAM: CT IMAGE GUIDED DRAINAGE BY PERCUTANEOUS CATHETER COMPARISON:  CT-guided right trans gluteal approach percutaneous drainage catheter placement - 08/21/2016; CT abdomen pelvis - 08/22/2016; 08/20/2016 MEDICATIONS: The patient is currently admitted to the hospital and receiving intravenous antibiotics. The antibiotics were administered within an appropriate time frame prior to the initiation of the procedure. ANESTHESIA/SEDATION: Moderate (conscious) sedation was employed during this procedure. A total of Versed 5 mg and Fentanyl 100 mcg was administered intravenously. Moderate Sedation Time: 18 minutes. The patient's level of consciousness and vital signs were monitored continuously by radiology nursing throughout the procedure under my direct supervision. CONTRAST:  None COMPLICATIONS: None immediate. PROCEDURE: Informed written consent was obtained from the patient after a discussion of the risks, benefits and alternatives to treatment. The patient was placed supine on the CT gantry and a pre procedural CT was performed re-demonstrating the known abscess/fluid collection within the right lower  abdomen with dominant component air and fluid containing component measuring approximately 8.8 x 4.6 cm (image 27, series 2). The procedure was planned. A timeout was performed prior to the initiation of the procedure. The skin overlying the anterior aspect the right lower abdomen/pelvis was prepped and draped in the usual sterile fashion. The overlying soft tissues were anesthetized with 1% lidocaine with epinephrine. Appropriate trajectory was planned with the use of a 22 gauge spinal needle. An 18 gauge trocar needle was advanced into the abscess/fluid collection and a short Amplatz super stiff wire was coiled within the collection. Appropriate positioning was confirmed with a limited CT scan. The tract was serially dilated allowing placement of a 10 [place] all-purpose drainage catheter. Appropriate positioning was confirmed with a limited postprocedural CT scan. Approximately 30 Ml of purulent fluid was aspirated. The tube was connected to a drainage bag and sutured in place. A dressing was placed. The patient tolerated the procedure well without immediate post procedural complication. IMPRESSION: Successful CT guided placement of a 10 French all purpose drain catheter into the residual air and fluid containing abscess within the right lower abdominal quadrant with aspiration of 30 mL of purulent fluid. Samples were sent to the laboratory as requested by the ordering clinical team. Electronically Signed   By: Simonne Come M.D.   On: 08/23/2016 15:58    Labs:  CBC:  Recent Labs  08/22/16 0747 08/23/16 0639 08/23/16 1112 08/26/16 0504  WBC 9.7 10.0 11.8* 6.7  HGB 13.3 13.2 14.2 12.8*  HCT 38.7* 40.1 40.9 36.8*  PLT 221 255 270 267    COAGS:  Recent Labs  08/21/16 0455  INR 1.05  APTT 30    BMP:  Recent Labs  08/21/16 0455 08/22/16 0747 08/23/16 1610 08/26/16 9604  NA 142 141 140 138  K 3.3* 3.6 3.4* 3.6  CL 107 108 104 105  CO2 25 24 24 25   GLUCOSE 98 90 84 94  BUN 27* 22*  18 6  CALCIUM 8.7* 8.4* 8.6* 8.2*  CREATININE 0.74 0.67 0.66 0.56*  GFRNONAA >60 >60 >60 >60  GFRAA >60 >60 >60 >60    LIVER FUNCTION TESTS:  Recent Labs  08/21/16 0455 08/22/16 0747 08/23/16 0639 08/26/16 0504  BILITOT 2.5* 2.6* 2.3* 1.4*  AST 15 18 19 19   ALT 20 27 27 31   ALKPHOS 47 47 49 44  PROT 6.4* 5.7* 5.9* 5.5*  ALBUMIN 3.0* 3.0* 3.1* 2.9*    Assessment and Plan: S/p TG drain placement on 2/1 and RLQ on 2/3 for intra-abdominal abscesses; for d/c home today; AF; drain fluid cx's- e coli/enterococcus/staph; outputs currently 30-40 cc each sl turbid serosang fluid; CT yesterday shows smaller but residual abscesses; will schedule pt for f/u drain clinic [phone]) eval in 2 weeks; in meantime should flush drains once daily, record outputs and change dressing at least every other day; other plans as per CCS   Electronically Signed: D. Jeananne Rama 08/27/2016, 10:02 AM   I spent a total of 15 minutes at the the patient's bedside AND on the patient's hospital floor or unit, greater than 50% of which was counseling/coordinating care for pelvic abscess drains    Patient ID: [name], male   DOB: 20-Jun-1981, 36 y.o.   MRN: [id number]

## 2016-08-27 NOTE — Progress Notes (Signed)
Patient resting in bed.  Ambulated several times today.  No complaints of patient.  Patient to be discharged after visit from IR.

## 2016-08-28 ENCOUNTER — Other Ambulatory Visit: Payer: Self-pay | Admitting: Surgery

## 2016-08-28 DIAGNOSIS — Z8719 Personal history of other diseases of the digestive system: Secondary | ICD-10-CM

## 2016-08-29 LAB — SUSCEPTIBILITY RESULT

## 2016-08-29 LAB — SUSCEPTIBILITY, AER + ANAEROB

## 2016-08-29 MED FILL — MONOJECT 0.9% NA CL SYRING: 0.9 | 30 days supply | Qty: 30 | Fill #0

## 2016-08-30 LAB — AEROBIC/ANAEROBIC CULTURE (SURGICAL/DEEP WOUND)

## 2016-08-30 LAB — AEROBIC/ANAEROBIC CULTURE W GRAM STAIN (SURGICAL/DEEP WOUND)

## 2016-09-01 LAB — SUSCEPTIBILITY RESULT

## 2016-09-01 LAB — SUSCEPTIBILITY, AER + ANAEROB

## 2016-09-02 LAB — AEROBIC/ANAEROBIC CULTURE (SURGICAL/DEEP WOUND)

## 2016-09-02 LAB — AEROBIC/ANAEROBIC CULTURE W GRAM STAIN (SURGICAL/DEEP WOUND): Special Requests: NORMAL

## 2016-09-09 ENCOUNTER — Other Ambulatory Visit: Payer: Self-pay | Admitting: Surgery

## 2016-09-09 ENCOUNTER — Encounter: Payer: Self-pay | Admitting: Radiology

## 2016-09-09 ENCOUNTER — Ambulatory Visit
Admission: RE | Admit: 2016-09-09 | Discharge: 2016-09-09 | Disposition: A | Discharge status: Home / Self Care | Payer: BLUE CROSS/BLUE SHIELD | Source: Ambulatory Visit | Attending: Surgery | Admitting: Surgery

## 2016-09-09 ENCOUNTER — Ambulatory Visit
Admission: RE | Admit: 2016-09-09 | Discharge: 2016-09-09 | Disposition: A | Discharge status: Home / Self Care | Payer: BLUE CROSS/BLUE SHIELD | Source: Ambulatory Visit | Attending: Radiology | Admitting: Radiology

## 2016-09-09 DIAGNOSIS — Z8719 Personal history of other diseases of the digestive system: Secondary | ICD-10-CM

## 2016-09-09 HISTORY — PX: IR GENERIC HISTORICAL: IMG1180011

## 2016-09-09 MED ORDER — IOPAMIDOL (ISOVUE-300) INJECTION 61%
125.0000 mL | Freq: Once | INTRAVENOUS | Status: AC | PRN
  Administered 2016-09-09: 125 mL via INTRAVENOUS

## 2016-09-09 NOTE — Progress Notes (Signed)
Patient ID: [name], male   DOB: 1980/11/21, 36 y.o.   MRN: [id number]   Referring Physician(s): Dr. Phylliss Blakes  Chief Complaint: The patient is seen in follow up today s/p percutaneous drain placements on 2/1 and 2/3 for intra-abdominal abscesses s/p small bowel perforation  History of present illness:  This is a 36 yo male who is known to the IR service as he had a transgluteal and anterior abdominal wall percutaneous drain placement on 2/1 and 2/3 for abscesses suspected to be secondary to bowel perforation, site undetermined.  He has completed his abx therapy at home this past Saturday.  He denies any fevers, chills, or further pain.  His TG drain is not bothering him, but his anterior drain is secondary to this being positioned around his belt and his seatbelt.  He is flushing each drain with about 5cc daily.  When he flushes the anterior drain, the flush comes out of his TG drain.  He is having minimal 5-10cc of output daily.  He presents today for a repeat CT of his abd/pel and drain injection.  Past Medical History:  Diagnosis Date  . Legionella pneumonia (HCC)    2009    Past Surgical History:  Procedure Laterality Date  . HERNIA REPAIR    . IR GENERIC HISTORICAL  09/09/2016   IR RADIOLOGIST EVAL & MGMT 09/09/2016 Berdine Dance, MD [facility] INTERV RAD  . KNEE SURGERY Left     Allergies: Keflex [cephalexin]; Penicillins; and Sulfa antibiotics  Medications: Prior to Admission medications   Medication Sig Start Date End Date Taking? Authorizing Provider  fluconazole (DIFLUCAN) 100 MG tablet Take 1 (one) tablet once if needed for fungal infection. If it does not resolve in 72 hours take the second tablet 08/27/16   Edson Snowball, PA-C  loratadine (CLARITIN) 10 MG tablet Take 10 mg by mouth daily.    Historical Provider, MD  Multiple Vitamin (MULTIVITAMIN WITH MINERALS) TABS tablet Take 1 tablet by mouth daily.    Historical Provider, MD  ondansetron (ZOFRAN-ODT) 8 MG  disintegrating tablet Take 1 tablet (8 mg total) by mouth every 8 (eight) hours as needed for nausea. 08/18/16   Elvina Sidle, MD  sodium chloride flush 0.9 % SOLN injection Flush each daily once daily with 5cc normal saline 08/27/16   Edson Snowball, PA-C  sucralfate (CARAFATE) 1 GM/10ML suspension Take 10 mLs (1 g total) by mouth 2 (two) times daily. 08/19/16   Salley Scarlet, MD     Family History  Problem Relation Age of Onset  . Diabetes Father   . Cancer Paternal Grandfather     oral cancer  . Mental illness Mother     Social History   Social History  . Marital status: Married    Spouse name: N/A  . Number of children: N/A  . Years of education: N/A   Social History Main Topics  . Smoking status: Never Smoker  . Smokeless tobacco: Never Used  . Alcohol use No  . Drug use: No  . Sexual activity: Yes     Comment: married, works at [employer]   Other Topics Concern  . Not on file   Social History Narrative  . No narrative on file     Vital Signs:  Physical Exam Gen: NAD Abd: soft, NT, ND, TG drain site is c/d/i.  Drain is removed from this site.  Anterior drain site is also c/d/i.  Drain with minimal tan, cloudy output.  This drain was flushed  and did appear to have a fistulous connection to his small bowel.  Imaging: Ct Abdomen Pelvis W Contrast  Result Date: 09/09/2016 CLINICAL DATA:  Pelvic abscess, bowel obstruction, status post percutaneous drains EXAM: CT ABDOMEN AND PELVIS WITH CONTRAST TECHNIQUE: Multidetector CT imaging of the abdomen and pelvis was performed using the standard protocol following bolus administration of intravenous contrast. CONTRAST:  ISOVUE-300 IOPAMIDOL (ISOVUE-300) INJECTION 61% COMPARISON:  08/27/2007 FINDINGS: Lower chest: Minor basilar atelectasis. Hepatobiliary: Diffuse hypoattenuation of the liver parenchyma compatible with hepatic steatosis. No biliary dilatation. Patent hepatic and portal veins. Indeterminate enhancing lesion in  the left hepatic dome, image 13 measuring 15 mm. This may represent a transient hepatic attenuation defect or hemangioma. Recommend further evaluation with nonemergent follow-up abdominal MRI without and with contrast. Gallbladder and biliary system appear normal. Pancreas: Unremarkable. No pancreatic ductal dilatation or surrounding inflammatory changes. Spleen: Normal in size without focal abnormality. Adrenals/Urinary Tract: Adrenal glands are unremarkable. Kidneys are normal, without renal calculi, focal lesion, or hydronephrosis. Bladder is unremarkable. Stomach/Bowel: Negative for bowel obstruction, significant dilatation, ileus, or free air. No residual fluid collection or abscess. Stable appendicoliths in the appendix without inflammatory process. Diverticulosis of the sigmoid colon. Stable anterior and posterior pelvic percutaneous drains. Resolved pelvic air fluid collections. Vascular/Lymphatic: No significant vascular findings are present. No enlarged abdominal or pelvic lymph nodes. Reproductive: Seminal vesicles and prostate normal in size. No acute finding. Other: No abdominal wall hernia or abnormality. No abdominopelvic ascites. Musculoskeletal: No acute or significant osseous findings. IMPRESSION: Resolved pelvic abscesses, status post anterior and posterior pelvic drains. Hepatic steatosis 15 mm enhancing left hepatic dome indeterminate lesion by single phase imaging. Recommend follow-up nonemergent abdominal MRI without and with contrast. Incidental appendicoliths without acute inflammatory process or appendicitis Sigmoid diverticulosis Electronically Signed   By: Judie Petit.  Shick M.D.   On: 09/09/2016 10:13   Ir Radiologist Eval & Mgmt  Result Date: 09/09/2016 Please refer to "Notes" to see consult details.   Labs:  CBC:  Recent Labs  08/22/16 0747 08/23/16 0639 08/23/16 1112 08/26/16 0504  WBC 9.7 10.0 11.8* 6.7  HGB 13.3 13.2 14.2 12.8*  HCT 38.7* 40.1 40.9 36.8*  PLT 221 255 270  267    COAGS:  Recent Labs  08/21/16 0455  INR 1.05  APTT 30    BMP:  Recent Labs  08/21/16 0455 08/22/16 0747 08/23/16 0639 08/26/16 0504  NA 142 141 140 138  K 3.3* 3.6 3.4* 3.6  CL 107 108 104 105  CO2 25 24 24 25   GLUCOSE 98 90 84 94  BUN 27* 22* 18 6  CALCIUM 8.7* 8.4* 8.6* 8.2*  CREATININE 0.74 0.67 0.66 0.56*  GFRNONAA >60 >60 >60 >60  GFRAA >60 >60 >60 >60    LIVER FUNCTION TESTS:  Recent Labs  08/21/16 0455 08/22/16 0747 08/23/16 0639 08/26/16 0504  BILITOT 2.5* 2.6* 2.3* 1.4*  AST 15 18 19 19   ALT 20 27 27 31   ALKPHOS 47 47 49 44  PROT 6.4* 5.7* 5.9* 5.5*  ALBUMIN 3.0* 3.0* 3.1* 2.9*    Assessment:  1. Bowel perforation with intra-abdominal abscesses (site of perforation not definitively known)  The patient is doing very well.  His transgluteal drain was able to be removed in clinic today as there is resolution on his CT scan of the abscesses.  Given no significant proximity to bowel on the scan, this drain was not injected.  However, the anterior drain was injected and did appear to confirm a  small fistulous connection to his small bowel.  His gravity drain was replaced, and he has been told to stop flushing this drain.  We will have him return in 2 weeks for a drain injection only.  No further imaging is needed for this problem at this time.  The patient understands.  He is also following up with Dr. Phylliss Blakeshelsea Connor on Thursday.  2. Incidental liver lesion  Incidental liver lesion noted on today's CT scan that was present on his prior CT scans in the hospital.  This is likely benign, but does require further imaging with an MRI to better access, than this single phase imaging study today.  This was discussed with the patient and he understands this as well. Please see the CT scan report for specific recommendations for future imaging.   Signed: Letha CapeSBORNE,Lynard Postlewait E 09/09/2016, 10:41 AM   Please refer to Dr. Chester HolsteinShick's attestation of this note for  management and plan.

## 2016-09-23 ENCOUNTER — Ambulatory Visit
Admission: RE | Admit: 2016-09-23 | Discharge: 2016-09-23 | Disposition: A | Discharge status: Home / Self Care | Payer: BLUE CROSS/BLUE SHIELD | Source: Ambulatory Visit | Attending: Surgery | Admitting: Surgery

## 2016-09-23 DIAGNOSIS — Z8719 Personal history of other diseases of the digestive system: Secondary | ICD-10-CM

## 2016-09-23 HISTORY — PX: IR RADIOLOGIST EVAL & MGMT: IMG5224

## 2016-09-23 NOTE — Progress Notes (Signed)
Patient ID: [name], male   DOB: Aug 03, 1980, 36 y.o.   MRN: [id number]   Referring Physician(s): [name]  Chief Complaint: The patient is seen in follow up today s/p s/p percutaneous drain placements on 2/1 and 2/3 for intra-abdominal abscesses s/p small bowel perforation  History of present illness:  [name] is known to IR for Rogers TG and an anterior abdominal drain placement on 2/1 and 2/3 for an intra-abdominal fluid collection.  He saw us about 2 weeks ago where he was found to have Rogers fistulous connection from his drain to his small bowel.  He returns today for Rogers repeat injection.  He has been off of antibiotics since before 2 weeks ago.  He is feeling much better and is having more energy.  His drain is putting out only about 8-10cc Rogers day.  Past Medical History:  Diagnosis Date  . Legionella pneumonia (HCC)    2009    Past Surgical History:  Procedure Laterality Date  . HERNIA REPAIR    . IR GENERIC HISTORICAL  09/09/2016   IR RADIOLOGIST EVAL & MGMT 09/09/2016 Berdine DanceMichael Shick, MD [facility] INTERV RAD  . KNEE SURGERY Left     Allergies: Keflex [cephalexin]; Penicillins; and Sulfa antibiotics  Medications: Prior to Admission medications   Medication Sig Start Date End Date Taking? Authorizing Provider  fluconazole (DIFLUCAN) 100 MG tablet Take 1 (one) tablet once if needed for fungal infection. If it does not resolve in 72 hours take the second tablet 08/27/16   Edson SnowballBrooke Rogers Miller, PA-C  loratadine (CLARITIN) 10 MG tablet Take 10 mg by mouth daily.    Historical Provider, MD  Multiple Vitamin (MULTIVITAMIN WITH MINERALS) TABS tablet Take 1 tablet by mouth daily.    Historical Provider, MD  ondansetron (ZOFRAN-ODT) 8 MG disintegrating tablet Take 1 tablet (8 mg total) by mouth every 8 (eight) hours as needed for nausea. 08/18/16   Elvina SidleKurt Lauenstein, MD  sodium chloride flush 0.9 % SOLN injection Flush each daily once daily with 5cc normal saline 08/27/16   Edson SnowballBrooke Rogers Miller, PA-C    sucralfate (CARAFATE) 1 GM/10ML suspension Take 10 mLs (1 g total) by mouth 2 (two) times daily. 08/19/16   Salley ScarletKawanta F Sardis, MD     Family History  Problem Relation Age of Onset  . Diabetes Father   . Cancer Paternal Grandfather     oral cancer  . Mental illness Mother     Social History   Social History  . Marital status: Married    Spouse name: N/Rogers  . Number of children: N/Rogers  . Years of education: N/Rogers   Social History Main Topics  . Smoking status: Never Smoker  . Smokeless tobacco: Never Used  . Alcohol use No  . Drug use: No  . Sexual activity: Yes     Comment: married, works at [employer]   Other Topics Concern  . Not on file   Social History Narrative  . No narrative on file     Vital Signs: There were no vitals taken for this visit.  Physical Exam  Gen: NAD Abd: soft, NT, drain injected and shows Rogers collapse of the abscess cavity with no fistula presents.  His drain was pulled with no issues.   Imaging: No results found.  Labs:  CBC:  Recent Labs  08/22/16 0747 08/23/16 0639 08/23/16 1112 08/26/16 0504  WBC 9.7 10.0 11.8* 6.7  HGB 13.3 13.2 14.2 12.8*  HCT 38.7* 40.1 40.9 36.8*  PLT 221  255 270 267    COAGS:  Recent Labs  08/21/16 0455  INR 1.05  APTT 30    BMP:  Recent Labs  08/21/16 0455 08/22/16 0747 08/23/16 0639 08/26/16 0504  NA 142 141 140 138  K 3.3* 3.6 3.4* 3.6  CL 107 108 104 105  CO2 25 24 24 25   GLUCOSE 98 90 84 94  BUN 27* 22* 18 6  CALCIUM 8.7* 8.4* 8.6* 8.2*  CREATININE 0.74 0.67 0.66 0.56*  GFRNONAA >60 >60 >60 >60  GFRAA >60 >60 >60 >60    LIVER FUNCTION TESTS:  Recent Labs  08/21/16 0455 08/22/16 0747 08/23/16 0639 08/26/16 0504  BILITOT 2.5* 2.6* 2.3* 1.4*  AST 15 18 19 19   ALT 20 27 27 31   ALKPHOS 47 47 49 44  PROT 6.4* 5.7* 5.9* 5.5*  ALBUMIN 3.0* 3.0* 3.1* 2.9*    Assessment:  1. Intra-abdominal abscesses, s/p drain placements on 2/1 and 2/3  The patient's transgluteal drain was  removed on his last visit.  His anterior drain was injected today and shows that his fistula appears to have healed.  I have contacted Dr. Fredricka Bonine who states that she is ok with [place] moving forward with removing his drain.  This was done and no issues from it.  He will follow up with Dr. Fredricka Bonine next week.  No further IR needs at this time.  Signed: Evalene Vath E 09/23/2016, 11:37 AM   Please refer to Dr. Kenna Gilbert attestation of this note for management and plan.

## 2016-10-12 ENCOUNTER — Inpatient Hospital Stay (HOSPITAL_COMMUNITY)
Admission: EM | Admit: 2016-10-12 | Discharge: 2016-10-20 | DRG: 331 | Disposition: A | Discharge status: Home / Self Care | Payer: BLUE CROSS/BLUE SHIELD | Attending: General Surgery | Admitting: General Surgery

## 2016-10-12 ENCOUNTER — Emergency Department (HOSPITAL_COMMUNITY): Payer: BLUE CROSS/BLUE SHIELD

## 2016-10-12 ENCOUNTER — Encounter (HOSPITAL_COMMUNITY): Payer: Self-pay | Admitting: Oncology

## 2016-10-12 DIAGNOSIS — Z79899 Other long term (current) drug therapy: Secondary | ICD-10-CM | POA: Diagnosis not present

## 2016-10-12 DIAGNOSIS — Z880 Allergy status to penicillin: Secondary | ICD-10-CM

## 2016-10-12 DIAGNOSIS — Z882 Allergy status to sulfonamides status: Secondary | ICD-10-CM | POA: Diagnosis not present

## 2016-10-12 DIAGNOSIS — Z881 Allergy status to other antibiotic agents status: Secondary | ICD-10-CM

## 2016-10-12 DIAGNOSIS — K651 Peritoneal abscess: Secondary | ICD-10-CM | POA: Diagnosis not present

## 2016-10-12 LAB — COMPREHENSIVE METABOLIC PANEL
ALT: 19 U/L (ref 17–63)
AST: 25 U/L (ref 15–41)
Albumin: 4.6 g/dL (ref 3.5–5.0)
Alkaline Phosphatase: 71 U/L (ref 38–126)
Anion gap: 11 (ref 5–15)
BILIRUBIN TOTAL: 2.3 mg/dL — AB (ref 0.3–1.2)
BUN: 20 mg/dL (ref 6–20)
CALCIUM: 9.7 mg/dL (ref 8.9–10.3)
CO2: 19 mmol/L — AB (ref 22–32)
CREATININE: 0.96 mg/dL (ref 0.61–1.24)
Chloride: 106 mmol/L (ref 101–111)
Glucose, Bld: 139 mg/dL — ABNORMAL HIGH (ref 65–99)
Potassium: 3.7 mmol/L (ref 3.5–5.1)
Sodium: 136 mmol/L (ref 135–145)
Total Protein: 7.8 g/dL (ref 6.5–8.1)

## 2016-10-12 LAB — URINALYSIS, ROUTINE W REFLEX MICROSCOPIC
BILIRUBIN URINE: NEGATIVE
GLUCOSE, UA: NEGATIVE mg/dL
HGB URINE DIPSTICK: NEGATIVE
Ketones, ur: NEGATIVE mg/dL
Leukocytes, UA: NEGATIVE
Nitrite: NEGATIVE
Protein, ur: NEGATIVE mg/dL
Specific Gravity, Urine: 1.018 (ref 1.005–1.030)
pH: 7 (ref 5.0–8.0)

## 2016-10-12 LAB — CBC WITH DIFFERENTIAL/PLATELET
BASOS ABS: 0 10*3/uL (ref 0.0–0.1)
Basophils Relative: 0 %
EOS PCT: 0 %
Eosinophils Absolute: 0 10*3/uL (ref 0.0–0.7)
HCT: 47 % (ref 39.0–52.0)
Hemoglobin: 16.2 g/dL (ref 13.0–17.0)
Lymphocytes Relative: 5 %
Lymphs Abs: 0.7 10*3/uL (ref 0.7–4.0)
MCH: 30.5 pg (ref 26.0–34.0)
MCHC: 34.5 g/dL (ref 30.0–36.0)
MCV: 88.3 fL (ref 78.0–100.0)
MONO ABS: 0.7 10*3/uL (ref 0.1–1.0)
Monocytes Relative: 5 %
Neutro Abs: 14.4 10*3/uL — ABNORMAL HIGH (ref 1.7–7.7)
Neutrophils Relative %: 90 %
PLATELETS: 251 10*3/uL (ref 150–400)
RBC: 5.32 MIL/uL (ref 4.22–5.81)
RDW: 12.8 % (ref 11.5–15.5)
WBC: 15.9 10*3/uL — ABNORMAL HIGH (ref 4.0–10.5)

## 2016-10-12 LAB — CBC
HCT: 39.8 % (ref 39.0–52.0)
Hemoglobin: 13.8 g/dL (ref 13.0–17.0)
MCH: 30.6 pg (ref 26.0–34.0)
MCHC: 34.7 g/dL (ref 30.0–36.0)
MCV: 88.2 fL (ref 78.0–100.0)
PLATELETS: 204 10*3/uL (ref 150–400)
RBC: 4.51 MIL/uL (ref 4.22–5.81)
RDW: 12.9 % (ref 11.5–15.5)
WBC: 14.4 10*3/uL — AB (ref 4.0–10.5)

## 2016-10-12 LAB — CREATININE, SERUM: Creatinine, Ser: 0.68 mg/dL (ref 0.61–1.24)

## 2016-10-12 LAB — I-STAT CG4 LACTIC ACID, ED
LACTIC ACID, VENOUS: 1.74 mmol/L (ref 0.5–1.9)
Lactic Acid, Venous: 2.49 mmol/L (ref 0.5–1.9)

## 2016-10-12 LAB — PROTIME-INR
INR: 1
Prothrombin Time: 13.2 seconds (ref 11.4–15.2)

## 2016-10-12 MED ORDER — ONDANSETRON 4 MG PO TBDP: 4.0000 mg | ORAL_TABLET | Freq: Four times a day (QID) | ORAL | Status: DC | PRN

## 2016-10-12 MED ORDER — ONDANSETRON HCL 4 MG/2ML IJ SOLN
4.0000 mg | Freq: Once | INTRAMUSCULAR | Status: AC
  Administered 2016-10-12: 4 mg via INTRAVENOUS
  Filled 2016-10-12: qty 2

## 2016-10-12 MED ORDER — IOPAMIDOL (ISOVUE-300) INJECTION 61%
100.0000 mL | Freq: Once | INTRAVENOUS | Status: AC | PRN
  Administered 2016-10-12: 100 mL via INTRAVENOUS

## 2016-10-12 MED ORDER — METRONIDAZOLE IN NACL 5-0.79 MG/ML-% IV SOLN: 500.0000 mg | Freq: Three times a day (TID) | INTRAVENOUS | Status: DC

## 2016-10-12 MED ORDER — LIP MEDEX EX OINT
TOPICAL_OINTMENT | CUTANEOUS | Status: AC
  Administered 2016-10-12: 14:00:00
  Filled 2016-10-12: qty 7

## 2016-10-12 MED ORDER — LEVOFLOXACIN IN D5W 750 MG/150ML IV SOLN
750.0000 mg | Freq: Once | INTRAVENOUS | Status: DC
  Administered 2016-10-12: 750 mg via INTRAVENOUS
  Filled 2016-10-12: qty 150

## 2016-10-12 MED ORDER — MORPHINE SULFATE (PF) 4 MG/ML IV SOLN
2.0000 mg | INTRAVENOUS | Status: DC | PRN
  Administered 2016-10-12 – 2016-10-14 (×5): 4 mg via INTRAVENOUS
  Administered 2016-10-15: 2 mg via INTRAVENOUS
  Administered 2016-10-16: 4 mg via INTRAVENOUS
  Filled 2016-10-12 (×7): qty 1

## 2016-10-12 MED ORDER — ONDANSETRON HCL 4 MG/2ML IJ SOLN
4.0000 mg | INTRAMUSCULAR | Status: DC | PRN
  Administered 2016-10-12 – 2016-10-16 (×4): 4 mg via INTRAVENOUS
  Filled 2016-10-12 (×2): qty 2

## 2016-10-12 MED ORDER — IOPAMIDOL (ISOVUE-300) INJECTION 61%
INTRAVENOUS | Status: AC
  Administered 2016-10-12: 09:00:00
  Filled 2016-10-12: qty 100

## 2016-10-12 MED ORDER — ENOXAPARIN SODIUM 40 MG/0.4ML ~~LOC~~ SOLN
40.0000 mg | SUBCUTANEOUS | Status: DC
  Administered 2016-10-12 – 2016-10-15 (×4): 40 mg via SUBCUTANEOUS
  Filled 2016-10-12 (×4): qty 0.4

## 2016-10-12 MED ORDER — LEVOFLOXACIN IN D5W 750 MG/150ML IV SOLN: 750.0000 mg | INTRAVENOUS | Status: DC

## 2016-10-12 MED ORDER — SODIUM CHLORIDE 0.9 % IV SOLN
INTRAVENOUS | Status: DC
  Administered 2016-10-12 (×2): via INTRAVENOUS
  Administered 2016-10-13: 1000 mL via INTRAVENOUS
  Administered 2016-10-13 (×3): via INTRAVENOUS
  Administered 2016-10-14: 1000 mL via INTRAVENOUS

## 2016-10-12 MED ORDER — METRONIDAZOLE IN NACL 5-0.79 MG/ML-% IV SOLN: 500.0000 mg | Freq: Once | INTRAVENOUS | Status: DC

## 2016-10-12 MED ORDER — SODIUM CHLORIDE 0.9 % IV SOLN
1.0000 g | INTRAVENOUS | Status: DC
  Administered 2016-10-12 – 2016-10-18 (×7): 1 g via INTRAVENOUS
  Filled 2016-10-12 (×8): qty 1

## 2016-10-12 MED ORDER — HYDROMORPHONE HCL 1 MG/ML IJ SOLN
1.0000 mg | Freq: Once | INTRAMUSCULAR | Status: AC
  Administered 2016-10-12: 1 mg via INTRAVENOUS
  Filled 2016-10-12: qty 1

## 2016-10-12 MED ORDER — SODIUM CHLORIDE 0.9 % IV BOLUS (SEPSIS)
30.0000 mL/kg | Freq: Once | INTRAVENOUS | Status: AC
  Administered 2016-10-12: 3171 mL via INTRAVENOUS

## 2016-10-12 NOTE — Progress Notes (Signed)
Pharmacy Antibiotic Note  [name] is a 36 y.o. male admitted on 10/12/2016 with intra-abdominal infection.  Pharmacy has been consulted for levofloxacin/metronidazole dosing.  Plan:  Metronidazole 500 mg IV q8h   Levofloxacin 750 mg IV q24h  Monitor clinical course, renal function, cultures as available   Dosages will likely remain stable at above doses and need for further dosage adjustment appears unlikely at present.    Will sign off at this time.  Please reconsult if a change in clinical status warrants re-evaluation of dosage.    Height: 6\' 2"  (188 cm) Weight: 233 lb (105.7 kg) IBW/kg (Calculated) : 82.2  Temp (24hrs), Avg:99.8 F (37.7 C), Min:98.8 F (37.1 C), Max:100.5 F (38.1 C)   Recent Labs Lab 10/12/16 0651 10/12/16 0654  WBC 15.9*  --   CREATININE 0.96  --   LATICACIDVEN  --  2.49*    Estimated Creatinine Clearance: 139.1 mL/min (by C-G formula based on SCr of 0.96 mg/dL).    Allergies  Allergen Reactions  . Keflex [Cephalexin] Anaphylaxis and Rash  . Penicillins Other (See Comments)    Childhood Has patient had a PCN reaction causing immediate rash, facial/tongue/throat swelling, SOB or lightheadedness with hypotension: uknown Has patient had a PCN reaction causing severe rash involving mucus membranes or skin necrosis: unknown Has patient had a PCN reaction that required hospitalization unknown Has patient had a PCN reaction occurring within the last 10 years: uknown If all of the above answers are "NO", then may proceed with Cephalosporin use.   . Sulfa Antibiotics Other (See Comments)    Unknown. Childhood Allergy    Antimicrobials this admission: 3/25 levofloxacin >>  3/25 metronidazole >>   Dose adjustments this admission: --  Microbiology results: 3/25 BCx: sent  Thank you for allowing pharmacy to be a part of this patient's care.   Adalberto ColeNikola Aideen Fenster, PharmD, BCPS Pager [phone] 10/12/2016 9:11 AM

## 2016-10-12 NOTE — ED Triage Notes (Signed)
Pt recently had abdominal drains placed for a perf bowel.  Pt had the last port taken out approximately 3 weeks ago.  Pt is c/o abdominal pain, n/v,  fever, chills.  Pt meets criteria for sepsis workup.  Pt rates pain 5/10.

## 2016-10-12 NOTE — ED Notes (Signed)
Pt has a lactic acid of 2.49 EDP Campos and Rn Freida Busmanllen made aware.

## 2016-10-12 NOTE — ED Notes (Signed)
AC requested 5W given more time to accept patient.

## 2016-10-12 NOTE — ED Provider Notes (Signed)
[facility]-EMERGENCY DEPT Provider Note   CSN: [id number] Arrival date & time: 10/12/16  0554     History   Chief Complaint Chief Complaint  Patient presents with  . Abdominal Pain    HPI [name] is a 36 y.o. male.  Patient with history of recent small bowel perforation presents with severe, generalized abdominal pain that started last night, associated with nausea, vomiting and fever. He was discharged home 08/27/16 and drains were removed 2 weeks ago on office follow up. He has been doing well since discharge until last night. He denies hematemesis or bloody stools. Reports at this time he is having very little bowel gas, and bowel movements have been decreasing in volume. No cough, SOB, myalgias. He reports signs of sinus infection with postnasal drainage and sinus pressure.    The history is provided by the patient. No language interpreter was used.  Abdominal Pain   Associated symptoms include fever and vomiting. Pertinent negatives include diarrhea and myalgias.    Past Medical History:  Diagnosis Date  . Legionella pneumonia [facility])    2009    Patient Active Problem List   Diagnosis Date Noted  . SBO (small bowel obstruction) 08/21/2016  . Small bowel obstruction 08/20/2016    Past Surgical History:  Procedure Laterality Date  . HERNIA REPAIR    . IR GENERIC HISTORICAL  09/09/2016   IR RADIOLOGIST EVAL & MGMT 09/09/2016 Berdine Dance, MD [facility] INTERV RAD  . KNEE SURGERY Left        Home Medications    Prior to Admission medications   Medication Sig Start Date End Date Taking? Authorizing Provider  fluconazole (DIFLUCAN) 100 MG tablet Take 1 (one) tablet once if needed for fungal infection. If it does not resolve in 72 hours take the second tablet 08/27/16   Edson Snowball, PA-C  loratadine (CLARITIN) 10 MG tablet Take 10 mg by mouth daily.    Historical Provider, MD  Multiple Vitamin (MULTIVITAMIN WITH MINERALS) TABS tablet Take 1 tablet by mouth daily.     Historical Provider, MD  ondansetron (ZOFRAN-ODT) 8 MG disintegrating tablet Take 1 tablet (8 mg total) by mouth every 8 (eight) hours as needed for nausea. 08/18/16   Elvina Sidle, MD  sodium chloride flush 0.9 % SOLN injection Flush each daily once daily with 5cc normal saline 08/27/16   Edson Snowball, PA-C  sucralfate (CARAFATE) 1 GM/10ML suspension Take 10 mLs (1 g total) by mouth 2 (two) times daily. 08/19/16   Salley Scarlet, MD    Family History Family History  Problem Relation Age of Onset  . Diabetes Father   . Cancer Paternal Grandfather     oral cancer  . Mental illness Mother     Social History Social History  Substance Use Topics  . Smoking status: Never Smoker  . Smokeless tobacco: Never Used  . Alcohol use No     Allergies   Keflex [cephalexin]; Penicillins; and Sulfa antibiotics   Review of Systems Review of Systems  Constitutional: Positive for chills and fever.  HENT: Positive for sinus pressure.   Respiratory: Negative.  Negative for cough and shortness of breath.   Cardiovascular: Negative.   Gastrointestinal: Positive for abdominal pain and vomiting. Negative for blood in stool and diarrhea.  Musculoskeletal: Negative.  Negative for myalgias.  Skin: Negative.   Neurological: Negative.      Physical Exam Updated Vital Signs BP 112/69 (BP Location: Left Arm)   Pulse 99   Temp 98.8  F (37.1 C) (Oral)   Resp 18   Ht 6\' 2"  (1.88 m)   Wt 105.7 kg   SpO2 99%   BMI 29.92 kg/m   Physical Exam  Constitutional: He appears well-developed and well-nourished.  HENT:  Head: Normocephalic.  Nose: Right sinus exhibits no maxillary sinus tenderness and no frontal sinus tenderness. Left sinus exhibits no maxillary sinus tenderness and no frontal sinus tenderness.  Mouth/Throat: Mucous membranes are dry. Posterior oropharyngeal erythema present. No oropharyngeal exudate.  Neck: Normal range of motion. Neck supple.  Cardiovascular: Normal rate and  regular rhythm.   Pulmonary/Chest: Effort normal and breath sounds normal. He has no wheezes. He has no rales.  Abdominal: Soft. There is tenderness (Significant diffuse abdominal tenderness. ). There is guarding. There is no rebound.  Bowel sounds absent.  Musculoskeletal: Normal range of motion.  Neurological: He is alert. No cranial nerve deficit.  Skin: Skin is warm and dry. No rash noted.  Psychiatric: He has a normal mood and affect.     ED Treatments / Results  Labs (all labs ordered are listed, but only abnormal results are displayed) Labs Reviewed  CBC WITH DIFFERENTIAL/PLATELET - Abnormal; Notable for the following:       Result Value   WBC 15.9 (*)    Neutro Abs 14.4 (*)    All other components within normal limits  I-STAT CG4 LACTIC ACID, ED - Abnormal; Notable for the following:    Lactic Acid, Venous 2.49 (*)    All other components within normal limits  CULTURE, BLOOD (ROUTINE X 2)  CULTURE, BLOOD (ROUTINE X 2)  COMPREHENSIVE METABOLIC PANEL  PROTIME-INR  URINALYSIS, ROUTINE W REFLEX MICROSCOPIC    EKG  EKG Interpretation None       Radiology No results found. Ct Abdomen Pelvis W Contrast  Result Date: 10/12/2016 CLINICAL DATA:  Recent bowel perforation with drainage. Persistent abdominal pain with nausea, vomiting, and fevers EXAM: CT ABDOMEN AND PELVIS WITH CONTRAST TECHNIQUE: Multidetector CT imaging of the abdomen and pelvis was performed using the standard protocol following bolus administration of intravenous contrast. CONTRAST:  ISOVUE-300 IOPAMIDOL (ISOVUE-300) INJECTION 61% COMPARISON:  September 09, 2016 and August 22, 2016 FINDINGS: Lower chest: There is mild bibasilar atelectasis. A small focus of infiltrate is noted in the inferior lingula, suspected small focus of pneumonia in this area. Hepatobiliary: There is an enhancing focus near the dome of the liver on the right anteriorly measuring 1.0 x 0.9 cm, a stable finding likely representing a  small hemangioma. No other focal liver lesion evident. Gallbladder wall is not thickened. There is no biliary duct dilatation. Pancreas: No pancreatic mass or inflammatory focus. Spleen: No splenic lesions are evident. Adrenals/Urinary Tract: Adrenals appear normal bilaterally. Kidneys bilaterally show no mass or hydronephrosis on either side. There is no renal or ureteral calculus. Urinary bladder is midline with wall thickness felt to be within normal limits. Note that there is stranding in the region of the urinary bladder due to inflammation from adjacent mesenteric and bowel. Stomach/Bowel: There are multiple sigmoid and distal descending colonic diverticulae without diverticulitis. Percutaneous drains have been removed from the pelvis. There is inflammation in the mesentery extensive pelvic enteritis, primarily involving loops of small bowel appear Of the pelvis which results in inflammation of multiple loops of bowel in the pelvis, particularly to the right of midline. Within this area in the mid right pelvis, there is a focal fluid collection that is difficult to separate from bowel measuring 5.1 x  3.5 x 3.4 cm. This fluid collection is best appreciated on axial slices 78 through 75 series 2, coronal slices 51 through 63 series 5, and sagittal slices 79 through 98 series 6. This fluid collection is consistent with a recurring abscess. Minimal air is seen along the periphery of this fluid collection. There is no frank bowel obstruction, although there is pelvic small bowel enteritis from this apparent abscess and surrounding mesenteric thickening. Elsewhere in the abdomen and pelvis, there are areas of slight loculated fluid without well-defined abscess. There is no appreciable free air or portal venous air. Vascular/Lymphatic: No abdominal aortic aneurysm. No vascular calcifications are evident. There is no appreciable adenopathy in the abdomen or pelvis. Reproductive: Prostate and seminal vesicles appear  normal. Other: There is no appendiceal region inflammation. There is no free fluid in the abdomen or pelvis. Musculoskeletal: There are no blastic or lytic bone lesions. No intramuscular or abdominal wall lesions. IMPRESSION: Extensive inflammation involving the mesenteric air of the lower abdomen and pelvis. There is a developing abscess in the mid right pelvis, slightly to the right of midline measuring 5.1 x 3.5 x 3.4 cm. There are a few areas of loculated fluid elsewhere in the abdomen or pelvis which do not appear sufficiently organized to classify as abscesses. There is no free air. Note that there is extensive pelvic bowel inflammation due to mesenteric inflammation and the right mid pelvic abscess. There are multiple colonic diverticula. Primary diverticulitis is not evident. There is no appendiceal inflammation.  No bowel obstruction. Probable small focus of pneumonia inferior lingula. Electronically Signed   By: Bretta Bang III M.D.   On: 10/12/2016 08:51   Dg Sinus/fist Tube Chk-non Gi  Result Date: 09/23/2016 CLINICAL DATA:  36 year old male with a history of pelvic abscess, status post CT-guided drainage through trans gluteal drain, now removed, as well as an anterior drain placed. He returns today for injection EXAM: ABSCESS INJECTION CONTRAST:  10 cc FLUOROSCOPY TIME:  Radiation Exposure Index (if provided by the fluoroscopic device): 26.4 mGy COMPARISON:  Injection 09/09/2016, CT 09/09/2016 and 08/26/2016 FINDINGS: Patient positioned supine position on the fluoroscopy table. Scout image of the pelvis performed. The abscess cavity drain was then injected with contrast. At the conclusion, findings were discussed with Dr. Fredricka Bonine of surgery and the drain was removed at the bedside. IMPRESSION: Status post injection of abscess drain demonstrating no evidence of fistula and collapse of the abscess cavity. Drain was removed at the bedside. Signed, Yvone Neu. Loreta Ave, DO [facility] Radiology Electronically Signed   By: Gilmer Mor D.O.   On: 09/23/2016 13:27   Ct Image Guided Drainage By Percutaneous Catheter  Result Date: 10/13/2016 INDICATION: History of colonic perforation, presumably diverticular in etiology, post right trans gluteal approach percutaneous drainage catheter placement on 08/21/2016 followed by drainage catheter placement within the anterior right lower abdominal quadrant on 08/23/2016. The trans gluteal approach drainage catheter was removed following drainage catheter injection on 09/09/2016. The right anterior lower abdominal percutaneous drainage catheter was removed on 09/23/2016 following percutaneous drainage catheter injection. Unfortunately, the patient returned to the emergency department yesterday with worsening abdominal pain with CT imaging demonstrating recurrent indeterminate fluid collection with the right lower abdominal quadrant. Request made for placement of a new right lower quadrant percutaneous drainage catheter. EXAM: CT IMAGE GUIDED DRAINAGE BY PERCUTANEOUS CATHETER COMPARISON:  CT abdomen pelvis -10/12/2016 ; 08/20/2016; CT-guided right trans gluteal approach percutaneous drainage catheter - 08/21/2016; CT-guided right anterior approach percutaneous  drainage catheter - 08/23/2016; fluoroscopic guided percutaneous drainage catheter injection - 09/23/2016; 09/09/2016 MEDICATIONS: The patient is currently admitted to the hospital and receiving intravenous antibiotics. The antibiotics were administered within an appropriate time frame prior to the initiation of the procedure. ANESTHESIA/SEDATION: Moderate (conscious) sedation was employed during this procedure. A total of Versed 2 mg and Fentanyl 150 mcg was administered intravenously. Moderate Sedation Time: 17 minutes. The patient's level of consciousness and vital signs were monitored continuously by radiology nursing throughout the procedure under my direct  supervision. CONTRAST:  None COMPLICATIONS: None immediate. PROCEDURE: Informed written consent was obtained from the patient after a discussion of the risks, benefits and alternatives to treatment. The patient was placed supine on the CT gantry and a pre procedural CT was performed re-demonstrating the known abscess/fluid collection within the right lower abdominal quadrant with dominant ill-defined component measuring approximately 4.5 x 3.5 cm (image 12, series 2). The procedure was planned. A timeout was performed prior to the initiation of the procedure. The skin overlying the right anterior lower abdomen was prepped and draped in the usual sterile fashion. The overlying soft tissues were anesthetized with 1% lidocaine with epinephrine. Appropriate trajectory was planned with the use of a 22 gauge spinal needle. An 18 gauge trocar needle was advanced into the abscess/fluid collection and a short Amplatz super stiff wire was coiled within the collection. Appropriate positioning was confirmed with a limited CT scan. The tract was serially dilated allowing placement of a 10 [place] all-purpose drainage catheter. Appropriate positioning was confirmed with a limited postprocedural CT scan. Approximately 50 Ml of purulent, foul smelling fluid was aspirated. The tube was connected to a drainage bag and sutured in place. A dressing was placed. The patient tolerated the procedure well without immediate post procedural complication. IMPRESSION: Successful CT guided placement of a 10 French all purpose drain catheter into the right lower abdomen with aspiration of 50 mL of purulent, foul smelling fluid. Samples were sent to the laboratory as requested by the ordering clinical team. Electronically Signed   By: Simonne ComeJohn  Watts M.D.   On: 10/13/2016 12:14   Procedures Procedures (including critical care time)  Medications Ordered in ED Medications  HYDROmorphone (DILAUDID) injection 1 mg (not administered)  ondansetron  (ZOFRAN) injection 4 mg (not administered)  sodium chloride 0.9 % bolus 3,171 mL (not administered)     Initial Impression / Assessment and Plan / ED Course  I have reviewed the triage vital signs and the nursing notes.  Pertinent labs & imaging results that were available during my care of the patient were reviewed by me and considered in my medical decision making (see chart for details).     Patient with recent admission for small bowel perforation presents with recurrent abdominal pain. No fever. IVF's started with medications aimed at symptom management, including pain, nausea and tachycardia. Sepsis order set pending.  The patient is comfortable on re-examination. Patient and family updated.   CT scan show development of intra-abdominal abscess in right side. No free air. Discussed results with Dr. Abbey Chattersosenbower who will see and admit the patient.   Final Clinical Impressions(s) / ED Diagnoses   Final diagnoses:  None   1. Intra-abdominal abscess  New Prescriptions New Prescriptions   No medications on file     Elpidio AnisShari Zaineb Nowaczyk, Cordelia Poche-C 10/15/16 0120    Azalia BilisKevin Campos, MD 10/16/16 [phone]

## 2016-10-12 NOTE — Progress Notes (Signed)
IR aware of request for perc drain to abdominal abscess. Dr. Lowella DandyHenn has reviewed images and feels perc drain appropriate. Pt to be admitted and started on IV abx. Keep NPO and hold Lovenox tomorrow. Plan for CT guided drain tomorrow.  Brayton ElKevin Markale Birdsell PA-C Interventional Radiology 10/12/2016 12:27 PM

## 2016-10-12 NOTE — ED Notes (Signed)
Attempted to call [name] at [facility] with report, unable to take report at this time.

## 2016-10-12 NOTE — H&P (Signed)
[name] is an 36 y.o. male.   Chief Complaint:   Severe lower abdominal pain HPI:  This is a 36 year old male who began having severe abdominal pain that started last night. He had low-grade fever and nausea and vomiting. His past medical history is notable for being admitted 08/20/2016 with a pneumoperitoneum as well as abdominal abscesses of unknown etiology. At the time of admission, he had no signs of an acute abdomen or sepsis. He was managed with percutaneous drains. 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 He got well and was discharged 08/27/2016. He followed up in the drain clinic and the fistula was noted to have resolved. The last drain was removed 09/23/2016. He subsequently saw Dr. Windle Guard in the office and was doing well at that time. Of note is that he's been doing a lot of heavy lifting and straining the past few days.  Past Medical History:  Diagnosis Date  . 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 HERNIA REPAIR    . IR GENERIC HISTORICAL  09/09/2016   IR RADIOLOGIST EVAL & MGMT 09/09/2016 Greggory Keen, MD [facility] INTERV RAD  . KNEE SURGERY Left     Family History  Problem Relation Age of Onset  . Diabetes Father   . Cancer Paternal Grandfather     oral cancer  . Mental illness Mother    Social History:  reports that he has never smoked. He has never used smokeless tobacco. He reports that he does not drink alcohol or use drugs.  Allergies:  Allergies  Allergen Reactions  . Keflex [Cephalexin] Anaphylaxis and Rash  . Penicillins Other (See Comments)    Childhood Has patient had a PCN reaction causing immediate rash, facial/tongue/throat swelling, SOB or lightheadedness with hypotension: uknown Has patient had a PCN reaction causing severe rash involving mucus membranes or skin necrosis:  unknown Has patient had a PCN reaction that required hospitalization unknown Has patient had a PCN reaction occurring within the last 10 years: uknown If all of the above answers are "NO", then may proceed with Cephalosporin use.   . Sulfa Antibiotics Other (See Comments)    Unknown. Childhood Allergy   Prior to Admission medications   Medication Sig Start Date End Date Taking? Authorizing Provider  fexofenadine (ALLEGRA) 180 MG tablet Take 180 mg by mouth daily. Rotates with Claritin for seasonal allergies   Yes Historical Provider, MD  fluconazole (DIFLUCAN) 100 MG tablet Take 1 (one) tablet once if needed for fungal infection. If it does not resolve in 72 hours take the second tablet Patient taking differently: Take 1 (one) tablet once if needed for fungal infection. If it does not resolve in 72 hours take the second tablet. Has on hand when taking antibiotics. 08/27/16  Yes Jerrye Beavers, PA-C  L-LYSINE PO Take 1 tablet by mouth 2 (two) times daily with a meal. Not sure of strength, possibly 500 mg   Yes Historical Provider, MD  loratadine (CLARITIN) 10 MG tablet Take 10 mg by mouth daily.   Yes Historical Provider, MD  Multiple Vitamin (MULTIVITAMIN WITH MINERALS) TABS tablet Take 1 tablet by mouth daily.   Yes Historical Provider, MD  sodium chloride flush 0.9 % SOLN injection Flush each daily once daily with 5cc normal saline 08/27/16  Yes Jerrye Beavers, PA-C  ondansetron (ZOFRAN-ODT) 8 MG disintegrating tablet Take 1 tablet (8 mg total) by mouth every 8 (eight) hours as needed for nausea. Patient not taking: Reported on 10/12/2016 08/18/16   Robyn Haber, MD  sucralfate (CARAFATE) 1 GM/10ML suspension Take 10 mLs (1 g total) by mouth 2 (two) times daily. Patient not taking: Reported on 10/12/2016 08/19/16   Alycia Rossetti, MD     Results for orders placed or performed during the hospital encounter of 10/12/16 (from the past 48 hour(s))  Comprehensive metabolic panel     Status:  Abnormal   Collection Time: 10/12/16  6:51 AM  Result Value Ref Range   Sodium 136 135 - 145 mmol/L   Potassium 3.7 3.5 - 5.1 mmol/L   Chloride 106 101 - 111 mmol/L   CO2 19 (L) 22 - 32 mmol/L   Glucose, Bld 139 (H) 65 - 99 mg/dL   BUN 20 6 - 20 mg/dL   Creatinine, Ser 0.96 0.61 - 1.24 mg/dL   Calcium 9.7 8.9 - 10.3 mg/dL   Total Protein 7.8 6.5 - 8.1 g/dL   Albumin 4.6 3.5 - 5.0 g/dL   AST 25 15 - 41 U/L   ALT 19 17 - 63 U/L   Alkaline Phosphatase 71 38 - 126 U/L   Total Bilirubin 2.3 (H) 0.3 - 1.2 mg/dL   GFR calc non Af Amer >60 >60 mL/min   GFR calc Af Amer >60 >60 mL/min    Comment: (NOTE) The eGFR has been calculated using the CKD EPI equation. This calculation has not been validated in all clinical situations. eGFR's persistently <60 mL/min signify possible Chronic Kidney Disease.    Anion gap 11 5 - 15  CBC with Differential     Status: Abnormal   Collection Time: 10/12/16  6:51 AM  Result Value Ref Range   WBC 15.9 (H) 4.0 - 10.5 K/uL   RBC 5.32 4.22 - 5.81 MIL/uL   Hemoglobin 16.2 13.0 - 17.0 g/dL   HCT 47.0 39.0 - 52.0 %   MCV 88.3 78.0 - 100.0 fL   MCH 30.5 26.0 - 34.0 pg   MCHC 34.5 30.0 - 36.0 g/dL   RDW 12.8 11.5 - 15.5 %   Platelets 251 150 - 400 K/uL   Neutrophils Relative % 90 %   Neutro Abs 14.4 (H) 1.7 - 7.7 K/uL   Lymphocytes Relative 5 %   Lymphs Abs 0.7 0.7 - 4.0 K/uL   Monocytes Relative 5 %   Monocytes Absolute 0.7 0.1 - 1.0 K/uL   Eosinophils Relative 0 %   Eosinophils Absolute 0.0 0.0 - 0.7 K/uL   Basophils Relative 0 %   Basophils Absolute 0.0 0.0 - 0.1 K/uL  Protime-INR     Status: None   Collection Time: 10/12/16  6:51 AM  Result Value Ref Range   Prothrombin Time 13.2 11.4 - 15.2 seconds   INR 1.00   I-Stat CG4 Lactic Acid, ED     Status: Abnormal   Collection Time: 10/12/16  6:54 AM  Result Value Ref Range   Lactic Acid, Venous 2.49 (HH) 0.5 - 1.9 mmol/L   Comment NOTIFIED PHYSICIAN   Urinalysis, Routine w reflex microscopic      Status: None   Collection Time: 10/12/16  8:13 AM  Result Value Ref Range   Color, Urine YELLOW YELLOW   APPearance CLEAR CLEAR   Specific Gravity, Urine 1.018 1.005 - 1.030   pH 7.0 5.0 - 8.0   Glucose, UA NEGATIVE  NEGATIVE mg/dL   Hgb urine dipstick NEGATIVE NEGATIVE   Bilirubin Urine NEGATIVE NEGATIVE   Ketones, ur NEGATIVE NEGATIVE mg/dL   Protein, ur NEGATIVE NEGATIVE mg/dL   Nitrite NEGATIVE NEGATIVE   Leukocytes, UA NEGATIVE NEGATIVE  I-Stat CG4 Lactic Acid, ED     Status: None   Collection Time: 10/12/16  9:40 AM  Result Value Ref Range   Lactic Acid, Venous 1.74 0.5 - 1.9 mmol/L   Ct Abdomen Pelvis W Contrast  Result Date: 10/12/2016 CLINICAL DATA:  Recent bowel perforation with drainage. Persistent abdominal pain with nausea, vomiting, and fevers EXAM: CT ABDOMEN AND PELVIS WITH CONTRAST TECHNIQUE: Multidetector CT imaging of the abdomen and pelvis was performed using the standard protocol following bolus administration of intravenous contrast. CONTRAST:  169m ISOVUE-300 IOPAMIDOL (ISOVUE-300) INJECTION 61% COMPARISON:  September 09, 2016 and August 22, 2016 FINDINGS: Lower chest: There is mild bibasilar atelectasis. A small focus of infiltrate is noted in the inferior lingula, suspected small focus of pneumonia in this area. Hepatobiliary: There is an enhancing focus near the dome of the liver on the right anteriorly measuring 1.0 x 0.9 cm, a stable finding likely representing a small hemangioma. No other focal liver lesion evident. Gallbladder wall is not thickened. There is no biliary duct dilatation. Pancreas: No pancreatic mass or inflammatory focus. Spleen: No splenic lesions are evident. Adrenals/Urinary Tract: Adrenals appear normal bilaterally. Kidneys bilaterally show no mass or hydronephrosis on either side. There is no renal or ureteral calculus. Urinary bladder is midline with wall thickness felt to be within normal limits. Note that there is stranding in the region of  the urinary bladder due to inflammation from adjacent mesenteric and bowel. Stomach/Bowel: There are multiple sigmoid and distal descending colonic diverticulae without diverticulitis. Percutaneous drains have been removed from the pelvis. There is inflammation in the mesentery extensive pelvic enteritis, primarily involving loops of small bowel appear Of the pelvis which results in inflammation of multiple loops of bowel in the pelvis, particularly to the right of midline. Within this area in the mid right pelvis, there is a focal fluid collection that is difficult to separate from bowel measuring 5.1 x 3.5 x 3.4 cm. This fluid collection is best appreciated on axial slices 78 through 75 series 2, coronal slices 51 through 63 series 5, and sagittal slices 79 through 98 series 6. This fluid collection is consistent with a recurring abscess. Minimal air is seen along the periphery of this fluid collection. There is no frank bowel obstruction, although there is pelvic small bowel enteritis from this apparent abscess and surrounding mesenteric thickening. Elsewhere in the abdomen and pelvis, there are areas of slight loculated fluid without well-defined abscess. There is no appreciable free air or portal venous air. Vascular/Lymphatic: No abdominal aortic aneurysm. No vascular calcifications are evident. There is no appreciable adenopathy in the abdomen or pelvis. Reproductive: Prostate and seminal vesicles appear normal. Other: There is no appendiceal region inflammation. There is no free fluid in the abdomen or pelvis. Musculoskeletal: There are no blastic or lytic bone lesions. No intramuscular or abdominal wall lesions. IMPRESSION: Extensive inflammation involving the mesenteric air of the lower abdomen and pelvis. There is a developing abscess in the mid right pelvis, slightly to the right of midline measuring 5.1 x 3.5 x 3.4 cm. There are a few areas of loculated fluid elsewhere in the abdomen or pelvis which do  not appear sufficiently organized to classify as abscesses. There is no free air. Note that there is  extensive pelvic bowel inflammation due to mesenteric inflammation and the right mid pelvic abscess. There are multiple colonic diverticula. Primary diverticulitis is not evident. There is no appendiceal inflammation.  No bowel obstruction. Probable small focus of pneumonia inferior lingula. Electronically Signed   By: Lowella Grip III M.D.   On: 10/12/2016 08:51    Review of Systems  Constitutional: Positive for fever.  Respiratory: Negative for cough.   Gastrointestinal: Positive for abdominal pain, nausea and vomiting. Negative for blood in stool.  Genitourinary: Negative for dysuria and hematuria.  Endo/Heme/Allergies:       No blood clots or bleeding disorders.    Blood pressure 100/62, pulse (!) 115, temperature 100.2 F (37.9 C), temperature source Oral, resp. rate 16, height 6' 2" (1.88 m), weight 105.7 kg (233 lb), SpO2 94 %. Physical Exam   GENERAL APPEARANCE:  WDWN appears ill.  Pleasant and cooperative.  EARS, NOSE, MOUTH THROAT:  [place]/AT external ears:  no lesions or deformities external nose:  no lesions or deformities hearing:  grossly normal lips:  moist, no deformities EYES external: conjunctiva, lids, sclerae normal pupils:  equal, round glasses: no/yes   CV ascultation:  Increased rate extremity edema:  no extremity varicosities:  no  RESP auscultation:  breath sounds equal and clear respiratory effort:  normal  BREASTS:  Symmetrical in size.  No dominant masses, nipple discharge or suspicious skin lesions.  GASTROINTESTINAL abdomen:  Soft, right pelvic and suprapubic tenderness, non-distended, no masses liver and spleen:  not enlarged. hernia:  none present scar:  Small in RLQ  MUSCULOSKELETAL digits/nails: no clubbing or cyanosis muscle strength:  grossly normal all extremities deformities:  none  NEUROLOGIC speech:   normal  PSYCHIATRIC alertness and orientation:  normal mood/affect/behavior:  normal judgement and insight:  normal       Ct Abdomen Pelvis W Contrast  Result Date: 10/12/2016 CLINICAL DATA:  Recent bowel perforation with drainage. Persistent abdominal pain with nausea, vomiting, and fevers EXAM: CT ABDOMEN AND PELVIS WITH CONTRAST TECHNIQUE: Multidetector CT imaging of the abdomen and pelvis was performed using the standard protocol following bolus administration of intravenous contrast. CONTRAST:  135m ISOVUE-300 IOPAMIDOL (ISOVUE-300) INJECTION 61% COMPARISON:  September 09, 2016 and August 22, 2016 FINDINGS: Lower chest: There is mild bibasilar atelectasis. A small focus of infiltrate is noted in the inferior lingula, suspected small focus of pneumonia in this area. Hepatobiliary: There is an enhancing focus near the dome of the liver on the right anteriorly measuring 1.0 x 0.9 cm, a stable finding likely representing a small hemangioma. No other focal liver lesion evident. Gallbladder wall is not thickened. There is no biliary duct dilatation. Pancreas: No pancreatic mass or inflammatory focus. Spleen: No splenic lesions are evident. Adrenals/Urinary Tract: Adrenals appear normal bilaterally. Kidneys bilaterally show no mass or hydronephrosis on either side. There is no renal or ureteral calculus. Urinary bladder is midline with wall thickness felt to be within normal limits. Note that there is stranding in the region of the urinary bladder due to inflammation from adjacent mesenteric and bowel. Stomach/Bowel: There are multiple sigmoid and distal descending colonic diverticulae without diverticulitis. Percutaneous drains have been removed from the pelvis. There is inflammation in the mesentery extensive pelvic enteritis, primarily involving loops of small bowel appear Of the pelvis which results in inflammation of multiple loops of bowel in the pelvis, particularly to the right of midline.  Within this area in the mid right pelvis, there is a focal fluid collection that is difficult to  separate from bowel measuring 5.1 x 3.5 x 3.4 cm. This fluid collection is best appreciated on axial slices 78 through 75 series 2, coronal slices 51 through 63 series 5, and sagittal slices 79 through 98 series 6. This fluid collection is consistent with a recurring abscess. Minimal air is seen along the periphery of this fluid collection. There is no frank bowel obstruction, although there is pelvic small bowel enteritis from this apparent abscess and surrounding mesenteric thickening. Elsewhere in the abdomen and pelvis, there are areas of slight loculated fluid without well-defined abscess. There is no appreciable free air or portal venous air. Vascular/Lymphatic: No abdominal aortic aneurysm. No vascular calcifications are evident. There is no appreciable adenopathy in the abdomen or pelvis. Reproductive: Prostate and seminal vesicles appear normal. Other: There is no appendiceal region inflammation. There is no free fluid in the abdomen or pelvis. Musculoskeletal: There are no blastic or lytic bone lesions. No intramuscular or abdominal wall lesions. IMPRESSION: Extensive inflammation involving the mesenteric air of the lower abdomen and pelvis. There is a developing abscess in the mid right pelvis, slightly to the right of midline measuring 5.1 x 3.5 x 3.4 cm. There are a few areas of loculated fluid elsewhere in the abdomen or pelvis which do not appear sufficiently organized to classify as abscesses. There is no free air. Note that there is extensive pelvic bowel inflammation due to mesenteric inflammation and the right mid pelvic abscess. There are multiple colonic diverticula. Primary diverticulitis is not evident. There is no appendiceal inflammation.  No bowel obstruction. Probable small focus of pneumonia inferior lingula. Electronically Signed   By: Lowella Grip III M.D.   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  Currently on sepsis protocol.  Does not have an acute abdomen on exam.  Plan:  Admit.  Start IV InVanz.  IR consult for percutaneous drainage.  If he does not improve with medical therapy and drainage, may need exploratory laparotomy.  Jemel Ono J 10/12/2016, 10:32 AM

## 2016-10-13 ENCOUNTER — Encounter (HOSPITAL_COMMUNITY): Payer: Self-pay | Admitting: Radiology

## 2016-10-13 ENCOUNTER — Inpatient Hospital Stay (HOSPITAL_COMMUNITY): Payer: BLUE CROSS/BLUE SHIELD

## 2016-10-13 LAB — CBC
HEMATOCRIT: 38.7 % — AB (ref 39.0–52.0)
Hemoglobin: 13.1 g/dL (ref 13.0–17.0)
MCH: 30.7 pg (ref 26.0–34.0)
MCHC: 33.9 g/dL (ref 30.0–36.0)
MCV: 90.6 fL (ref 78.0–100.0)
PLATELETS: 195 10*3/uL (ref 150–400)
RBC: 4.27 MIL/uL (ref 4.22–5.81)
RDW: 13.1 % (ref 11.5–15.5)
WBC: 14.2 10*3/uL — ABNORMAL HIGH (ref 4.0–10.5)

## 2016-10-13 LAB — HIV ANTIBODY (ROUTINE TESTING W REFLEX): HIV Screen 4th Generation wRfx: NONREACTIVE

## 2016-10-13 MED ORDER — FENTANYL CITRATE (PF) 100 MCG/2ML IJ SOLN
INTRAMUSCULAR | Status: AC | PRN
  Administered 2016-10-13 (×3): 50 ug via INTRAVENOUS

## 2016-10-13 MED ORDER — ACETAMINOPHEN 325 MG PO TABS
650.0000 mg | ORAL_TABLET | ORAL | Status: DC | PRN
  Administered 2016-10-13 – 2016-10-20 (×3): 650 mg via ORAL
  Filled 2016-10-13 (×3): qty 2

## 2016-10-13 MED ORDER — FENTANYL CITRATE (PF) 100 MCG/2ML IJ SOLN
INTRAMUSCULAR | Status: AC
  Filled 2016-10-13: qty 6

## 2016-10-13 MED ORDER — ONDANSETRON HCL 4 MG/2ML IJ SOLN
INTRAMUSCULAR | Status: AC
  Filled 2016-10-13: qty 2

## 2016-10-13 MED ORDER — FLUMAZENIL 0.5 MG/5ML IV SOLN
INTRAVENOUS | Status: AC
  Filled 2016-10-13: qty 5

## 2016-10-13 MED ORDER — MIDAZOLAM HCL 2 MG/2ML IJ SOLN
INTRAMUSCULAR | Status: AC | PRN
  Administered 2016-10-13 (×2): 1 mg via INTRAVENOUS

## 2016-10-13 MED ORDER — NALOXONE HCL 0.4 MG/ML IJ SOLN
INTRAMUSCULAR | Status: AC
  Filled 2016-10-13: qty 1

## 2016-10-13 MED ORDER — MIDAZOLAM HCL 2 MG/2ML IJ SOLN
INTRAMUSCULAR | Status: AC
  Filled 2016-10-13: qty 6

## 2016-10-13 NOTE — Consult Note (Signed)
Chief Complaint: Patient was seen in consultation today for abdominal pain  Referring Physician(s):   Dr. Abbey Chattersosenbower  Supervising Physician: Simonne ComeWatts, Ruben  [facility]  History of Present Illness: [name] is a 36 y.o. male with past medical history of hernia repair, and pneumoperitoneum 08/20/16  With abdominal abscesses of unknown etiology.  He did have 2 percutaneous drains placed during that admission that were followed as an outpatient due to small fistula formation. These drain have since been removed and patient states he was feeling better until he developed fever, nausea, and vomiting 10/11/16.   CT Abd/Pelvis: Extensive inflammation involving the mesenteric air of the lower abdomen and pelvis. There is a developing abscess in the mid right pelvis, slightly to the right of midline measuring 5.1 x 3.5 x 3.4 cm. There are a few areas of loculated fluid elsewhere in the abdomen or pelvis which do not appear sufficiently organized to classify as abscesses. There is no free air. Note that there is extensive pelvic bowel inflammation due to mesenteric inflammation and the right mid pelvic abscess. There are multiple colonic diverticula. Primary diverticulitis is not evident.  IR consulted for percutaneous drainage at the request of Dr. Abbey Chattersosenbower.  Case reviewed by Dr. Lowella DandyHenn who deemed patient appropriate for aspiration and drainage.   Patient has been NPO today.  He last received lovenox yesterday.   Past Medical History:  Diagnosis Date  . Legionella pneumonia (HCC)    2009    Past Surgical History:  Procedure Laterality Date  . HERNIA REPAIR    . IR GENERIC HISTORICAL  09/09/2016   IR RADIOLOGIST EVAL & MGMT 09/09/2016 Berdine DanceMichael Shick, MD [facility] INTERV RAD  . KNEE SURGERY Left     Allergies: Keflex [cephalexin]; Penicillins; and Sulfa antibiotics  Medications: Prior to Admission medications   Medication Sig Start Date End Date Taking?  Authorizing Provider  fexofenadine (ALLEGRA) 180 MG tablet Take 180 mg by mouth daily. Rotates with Claritin for seasonal allergies   Yes Historical Provider, MD  fluconazole (DIFLUCAN) 100 MG tablet Take 1 (one) tablet once if needed for fungal infection. If it does not resolve in 72 hours take the second tablet Patient taking differently: Take 1 (one) tablet once if needed for fungal infection. If it does not resolve in 72 hours take the second tablet. Has on hand when taking antibiotics. 08/27/16  Yes Edson SnowballBrooke A Miller, PA-C  L-LYSINE PO Take 1 tablet by mouth 2 (two) times daily with a meal. Not sure of strength, possibly 500 mg   Yes Historical Provider, MD  loratadine (CLARITIN) 10 MG tablet Take 10 mg by mouth daily.   Yes Historical Provider, MD  Multiple Vitamin (MULTIVITAMIN WITH MINERALS) TABS tablet Take 1 tablet by mouth daily.   Yes Historical Provider, MD  sodium chloride flush 0.9 % SOLN injection Flush each daily once daily with 5cc normal saline 08/27/16  Yes Brooke A Miller, PA-C  ondansetron (ZOFRAN-ODT) 8 MG disintegrating tablet Take 1 tablet (8 mg total) by mouth every 8 (eight) hours as needed for nausea. Patient not taking: Reported on 10/12/2016 08/18/16   Elvina SidleKurt Lauenstein, MD  sucralfate (CARAFATE) 1 GM/10ML suspension Take 10 mLs (1 g total) by mouth 2 (two) times daily. Patient not taking: Reported on 10/12/2016 08/19/16   Salley ScarletKawanta F [place], MD     Family History  Problem Relation Age of Onset  . Diabetes Father   . Cancer Paternal Grandfather     oral cancer  .  Mental illness Mother     Social History   Social History  . Marital status: Married    Spouse name: N/A  . Number of children: N/A  . Years of education: N/A   Social History Main Topics  . Smoking status: Never Smoker  . Smokeless tobacco: Never Used  . Alcohol use No  . Drug use: No  . Sexual activity: Yes     Comment: married, works at [employer]   Other Topics Concern  . None   Social History  Narrative  . None    Review of Systems  Constitutional: Positive for chills and fatigue.  Respiratory: Negative for cough and shortness of breath.   Cardiovascular: Negative for chest pain.  Gastrointestinal: Positive for abdominal pain, nausea and vomiting.  Psychiatric/Behavioral: Negative for behavioral problems and confusion.    Vital Signs: BP 108/62 (BP Location: Right Arm)   Pulse (!) 111   Temp 99.5 F (37.5 C) (Oral)   Resp 20   Ht 6\' 2"  (1.88 m)   Wt 233 lb (105.7 kg)   SpO2 98%   BMI 29.92 kg/m   Physical Exam  Constitutional: He appears well-developed.  Cardiovascular: Normal rate, regular rhythm and normal heart sounds.   Pulmonary/Chest: Effort normal and breath sounds normal.  Abdominal: There is tenderness.  Visible scars from prior hernia surgery as well as pelvic drain.  Nursing note and vitals reviewed.   Mallampati Score:  MD Evaluation Airway: WNL Heart: WNL Abdomen: WNL Chest/ Lungs: WNL ASA  Classification: 3 Mallampati/Airway Score: Two  Imaging: Ct Abdomen Pelvis W Contrast  Result Date: 10/12/2016 CLINICAL DATA:  Recent bowel perforation with drainage. Persistent abdominal pain with nausea, vomiting, and fevers EXAM: CT ABDOMEN AND PELVIS WITH CONTRAST TECHNIQUE: Multidetector CT imaging of the abdomen and pelvis was performed using the standard protocol following bolus administration of intravenous contrast. CONTRAST:  ISOVUE-300 IOPAMIDOL (ISOVUE-300) INJECTION 61% COMPARISON:  September 09, 2016 and August 22, 2016 FINDINGS: Lower chest: There is mild bibasilar atelectasis. A small focus of infiltrate is noted in the inferior lingula, suspected small focus of pneumonia in this area. Hepatobiliary: There is an enhancing focus near the dome of the liver on the right anteriorly measuring 1.0 x 0.9 cm, a stable finding likely representing a small hemangioma. No other focal liver lesion evident. Gallbladder wall is not thickened. There is no  biliary duct dilatation. Pancreas: No pancreatic mass or inflammatory focus. Spleen: No splenic lesions are evident. Adrenals/Urinary Tract: Adrenals appear normal bilaterally. Kidneys bilaterally show no mass or hydronephrosis on either side. There is no renal or ureteral calculus. Urinary bladder is midline with wall thickness felt to be within normal limits. Note that there is stranding in the region of the urinary bladder due to inflammation from adjacent mesenteric and bowel. Stomach/Bowel: There are multiple sigmoid and distal descending colonic diverticulae without diverticulitis. Percutaneous drains have been removed from the pelvis. There is inflammation in the mesentery extensive pelvic enteritis, primarily involving loops of small bowel appear Of the pelvis which results in inflammation of multiple loops of bowel in the pelvis, particularly to the right of midline. Within this area in the mid right pelvis, there is a focal fluid collection that is difficult to separate from bowel measuring 5.1 x 3.5 x 3.4 cm. This fluid collection is best appreciated on axial slices 78 through 75 series 2, coronal slices 51 through 63 series 5, and sagittal slices 79 through 98 series 6. This fluid collection is consistent  with a recurring abscess. Minimal air is seen along the periphery of this fluid collection. There is no frank bowel obstruction, although there is pelvic small bowel enteritis from this apparent abscess and surrounding mesenteric thickening. Elsewhere in the abdomen and pelvis, there are areas of slight loculated fluid without well-defined abscess. There is no appreciable free air or portal venous air. Vascular/Lymphatic: No abdominal aortic aneurysm. No vascular calcifications are evident. There is no appreciable adenopathy in the abdomen or pelvis. Reproductive: Prostate and seminal vesicles appear normal. Other: There is no appendiceal region inflammation. There is no free fluid in the abdomen or  pelvis. Musculoskeletal: There are no blastic or lytic bone lesions. No intramuscular or abdominal wall lesions. IMPRESSION: Extensive inflammation involving the mesenteric air of the lower abdomen and pelvis. There is a developing abscess in the mid right pelvis, slightly to the right of midline measuring 5.1 x 3.5 x 3.4 cm. There are a few areas of loculated fluid elsewhere in the abdomen or pelvis which do not appear sufficiently organized to classify as abscesses. There is no free air. Note that there is extensive pelvic bowel inflammation due to mesenteric inflammation and the right mid pelvic abscess. There are multiple colonic diverticula. Primary diverticulitis is not evident. There is no appendiceal inflammation.  No bowel obstruction. Probable small focus of pneumonia inferior lingula. Electronically Signed   By: Bretta Bang III M.D.   On: 10/12/2016 08:51   Dg Sinus/fist Tube Chk-non Gi  Result Date: 09/23/2016 CLINICAL DATA:  36 year old male with a history of pelvic abscess, status post CT-guided drainage through trans gluteal drain, now removed, as well as an anterior drain placed. He returns today for injection EXAM: ABSCESS INJECTION CONTRAST:  10 cc FLUOROSCOPY TIME:  Radiation Exposure Index (if provided by the fluoroscopic device): 26.4 mGy COMPARISON:  Injection 09/09/2016, CT 09/09/2016 and 08/26/2016 FINDINGS: Patient positioned supine position on the fluoroscopy table. Scout image of the pelvis performed. The abscess cavity drain was then injected with contrast. At the conclusion, findings were discussed with Dr. Fredricka Bonine of surgery and the drain was removed at the bedside. IMPRESSION: Status post injection of abscess drain demonstrating no evidence of fistula and collapse of the abscess cavity. Drain was removed at the bedside. Signed, Yvone Neu. Loreta Ave, DO [facility] Electronically Signed   By: Gilmer Mor D.O.   On:  09/23/2016 13:27    Labs:  CBC:  Recent Labs  08/26/16 0504 10/12/16 0651 10/12/16 1132 10/13/16 0457  WBC 6.7 15.9* 14.4* 14.2*  HGB 12.8* 16.2 13.8 13.1  HCT 36.8* 47.0 39.8 38.7*  PLT 267 251 204 195    COAGS:  Recent Labs  08/21/16 0455 10/12/16 0651  INR 1.05 1.00  APTT 30  --     BMP:  Recent Labs  08/22/16 0747 08/23/16 0639 08/26/16 0504 10/12/16 0651 10/12/16 1132  NA 141 140 138 136  --   K 3.6 3.4* 3.6 3.7  --   CL 108 104 105 106  --   CO2 24 24 25  19*  --   GLUCOSE 90 84 94 139*  --   BUN 22* 18 6 20   --   CALCIUM 8.4* 8.6* 8.2* 9.7  --   CREATININE 0.67 0.66 0.56* 0.96 0.68  GFRNONAA >60 >60 >60 >60 >60  GFRAA >60 >60 >60 >60 >60    LIVER FUNCTION TESTS:  Recent Labs  08/22/16 0747 08/23/16 0639 08/26/16 0504 10/12/16 0651  BILITOT 2.6*  2.3* 1.4* 2.3*  AST 18 19 19 25   ALT 27 27 31 19   ALKPHOS 47 49 44 71  PROT 5.7* 5.9* 5.5* 7.8  ALBUMIN 3.0* 3.1* 2.9* 4.6    TUMOR MARKERS: No results for input(s): AFPTM, CEA, CA199, CHROMGRNA in the last 8760 hours.  Assessment and Plan: Pelvic Abscess Patient with history of pneumoperitoneum s/p percutaneous drainage in past, presented to [facility] ED with abdominal pain, nausea, and vomiting.  CT Abd/Pelvis shows large right fluid collection, likely a developing abscess.  IR consulted for percutaneous aspiration and drainage. Case reviewed by Dr. Lowella Dandy who approves patient for procedure.  He has been NPO.  Lovenox was held overnight.  Anticipate procedure today.  Risks and benefits discussed with the patient including bleeding, infection, damage to adjacent structures, bowel perforation/fistula connection, and sepsis. All of the patient's questions were answered, patient is agreeable to proceed. Consent signed and in chart.  Thank you for this interesting consult.  I greatly enjoyed meeting Garren Greenman and look forward to participating in their care.  A copy of this report was sent to the  requesting provider on this date.  Electronically Signed: Hoyt Koch 10/13/2016, 9:51 AM   I spent a total of 40 Minutes    in face to face in clinical consultation, greater than 50% of which was counseling/coordinating care for pelvic fluid collection.

## 2016-10-13 NOTE — Procedures (Signed)
Pre procedural Dx: Abdominal Abscess Post procedural Dx: Same  Technically successful CT guided placed of a 10 Fr drainage catheter placement into the right lower abdomen yielding 50 cc of purulent, foul smelling fluid.    All aspirated samples sent to the laboratory for analysis.    EBL: Minimal  Complications: None immediate  Katherina RightJay Doretha Goding, MD Pager #: [phone]

## 2016-10-13 NOTE — Progress Notes (Signed)
Subjective: Drain placed earlier and he says it feels better and has had a BM since that happened.  Drainage was purulent when the drain place.  It is clear serous like fluid now.    Objective: Vital signs in last 24 hours: Temp:  [99.4 F (37.4 C)-99.8 F (37.7 C)] 99.5 F (37.5 C) (03/26 0544) Pulse Rate:  [104-115] 110 (03/26 1124) Resp:  [18-23] 19 (03/26 1124) BP: (106-155)/(62-138) 113/68 (03/26 1124) SpO2:  [95 %-99 %] 95 % (03/26 1137) Last BM Date: 10/12/16 NPO 3600 IV Urine x 11 TM 101 yesterday 11:00 AM, afebrile, 99.8 Tm since that time.  BP up some around 7 AM WBC 14.2   -  no change CT scan 10/12/16:  Extensive inflammation involving the mesenteric air of the lower abdomen and pelvis. There is a developing abscess in the mid right pelvis, slightly to the right of midline measuring 5.1 x 3.5 x 3.4 cm. There are a few areas of loculated fluid elsewhere in the abdomen or pelvis which do not appear sufficiently organized to classify as abscesses. There is no free air. Note that there is extensive pelvic bowel inflammation due to mesenteric inflammation and the right mid pelvic abscess. There are multiple colonic diverticula. Primary diverticulitis is not evident. There is no appendiceal inflammation.  No bowel obstruction. Probable small focus of pneumonia inferior lingula.    Intake/Output from previous day: 03/25 0701 - 03/26 0700 In: 3586.7 [I.V.:3586.7] Out: -  Intake/Output this shift: No intake/output data recorded.  General appearance: alert, cooperative and no distress GI: soft, less tender, BS hypoactive.  drainage is clear now.  Lab Results:   Recent Labs  10/12/16 1132 10/13/16 0457  WBC 14.4* 14.2*  HGB 13.8 13.1  HCT 39.8 38.7*  PLT 204 195    BMET  Recent Labs  10/12/16 0651 10/12/16 1132  NA 136  --   K 3.7  --   CL 106  --   CO2 19*  --   GLUCOSE 139*  --   BUN 20  --   CREATININE 0.96 0.68  CALCIUM 9.7  --    PT/INR  Recent  Labs  10/12/16 0651  LABPROT 13.2  INR 1.00     Recent Labs Lab 10/12/16 0651  AST 25  ALT 19  ALKPHOS 71  BILITOT 2.3*  PROT 7.8  ALBUMIN 4.6     Lipase     Component Value Date/Time   LIPASE 13 08/20/2016 0934     Studies/Results: Ct Abdomen Pelvis W Contrast  Result Date: 10/12/2016 CLINICAL DATA:  Recent bowel perforation with drainage. Persistent abdominal pain with nausea, vomiting, and fevers EXAM: CT ABDOMEN AND PELVIS WITH CONTRAST TECHNIQUE: Multidetector CT imaging of the abdomen and pelvis was performed using the standard protocol following bolus administration of intravenous contrast. CONTRAST:  ISOVUE-300 IOPAMIDOL (ISOVUE-300) INJECTION 61% COMPARISON:  September 09, 2016 and August 22, 2016 FINDINGS: Lower chest: There is mild bibasilar atelectasis. A small focus of infiltrate is noted in the inferior lingula, suspected small focus of pneumonia in this area. Hepatobiliary: There is an enhancing focus near the dome of the liver on the right anteriorly measuring 1.0 x 0.9 cm, a stable finding likely representing a small hemangioma. No other focal liver lesion evident. Gallbladder wall is not thickened. There is no biliary duct dilatation. Pancreas: No pancreatic mass or inflammatory focus. Spleen: No splenic lesions are evident. Adrenals/Urinary Tract: Adrenals appear normal bilaterally. Kidneys bilaterally show no mass or hydronephrosis on  either side. There is no renal or ureteral calculus. Urinary bladder is midline with wall thickness felt to be within normal limits. Note that there is stranding in the region of the urinary bladder due to inflammation from adjacent mesenteric and bowel. Stomach/Bowel: There are multiple sigmoid and distal descending colonic diverticulae without diverticulitis. Percutaneous drains have been removed from the pelvis. There is inflammation in the mesentery extensive pelvic enteritis, primarily involving loops of small bowel appear Of  the pelvis which results in inflammation of multiple loops of bowel in the pelvis, particularly to the right of midline. Within this area in the mid right pelvis, there is a focal fluid collection that is difficult to separate from bowel measuring 5.1 x 3.5 x 3.4 cm. This fluid collection is best appreciated on axial slices 78 through 75 series 2, coronal slices 51 through 63 series 5, and sagittal slices 79 through 98 series 6. This fluid collection is consistent with a recurring abscess. Minimal air is seen along the periphery of this fluid collection. There is no frank bowel obstruction, although there is pelvic small bowel enteritis from this apparent abscess and surrounding mesenteric thickening. Elsewhere in the abdomen and pelvis, there are areas of slight loculated fluid without well-defined abscess. There is no appreciable free air or portal venous air. Vascular/Lymphatic: No abdominal aortic aneurysm. No vascular calcifications are evident. There is no appreciable adenopathy in the abdomen or pelvis. Reproductive: Prostate and seminal vesicles appear normal. Other: There is no appendiceal region inflammation. There is no free fluid in the abdomen or pelvis. Musculoskeletal: There are no blastic or lytic bone lesions. No intramuscular or abdominal wall lesions. IMPRESSION: Extensive inflammation involving the mesenteric air of the lower abdomen and pelvis. There is a developing abscess in the mid right pelvis, slightly to the right of midline measuring 5.1 x 3.5 x 3.4 cm. There are a few areas of loculated fluid elsewhere in the abdomen or pelvis which do not appear sufficiently organized to classify as abscesses. There is no free air. Note that there is extensive pelvic bowel inflammation due to mesenteric inflammation and the right mid pelvic abscess. There are multiple colonic diverticula. Primary diverticulitis is not evident. There is no appendiceal inflammation.  No bowel obstruction. Probable small  focus of pneumonia inferior lingula. Electronically Signed   By: Bretta Bang III M.D.   On: 10/12/2016 08:51   Ct Image Guided Drainage By Percutaneous Catheter  Result Date: 10/13/2016 INDICATION: History of colonic perforation, presumably diverticular in etiology, post right trans gluteal approach percutaneous drainage catheter placement on 08/21/2016 followed by drainage catheter placement within the anterior right lower abdominal quadrant on 08/23/2016. The trans gluteal approach drainage catheter was removed following drainage catheter injection on 09/09/2016. The right anterior lower abdominal percutaneous drainage catheter was removed on 09/23/2016 following percutaneous drainage catheter injection. Unfortunately, the patient returned to the emergency department yesterday with worsening abdominal pain with CT imaging demonstrating recurrent indeterminate fluid collection with the right lower abdominal quadrant. Request made for placement of a new right lower quadrant percutaneous drainage catheter. EXAM: CT IMAGE GUIDED DRAINAGE BY PERCUTANEOUS CATHETER COMPARISON:  CT abdomen pelvis -10/12/2016 ; 08/20/2016; CT-guided right trans gluteal approach percutaneous drainage catheter - 08/21/2016; CT-guided right anterior approach percutaneous drainage catheter - 08/23/2016; fluoroscopic guided percutaneous drainage catheter injection - 09/23/2016; 09/09/2016 MEDICATIONS: The patient is currently admitted to the hospital and receiving intravenous antibiotics. The antibiotics were administered within an appropriate time frame prior to the initiation of the procedure. ANESTHESIA/SEDATION:  Moderate (conscious) sedation was employed during this procedure. A total of Versed 2 mg and Fentanyl 150 mcg was administered intravenously. Moderate Sedation Time: 17 minutes. The patient's level of consciousness and vital signs were monitored continuously by radiology nursing throughout the procedure under my direct  supervision. CONTRAST:  None COMPLICATIONS: None immediate. PROCEDURE: Informed written consent was obtained from the patient after a discussion of the risks, benefits and alternatives to treatment. The patient was placed supine on the CT gantry and a pre procedural CT was performed re-demonstrating the known abscess/fluid collection within the right lower abdominal quadrant with dominant ill-defined component measuring approximately 4.5 x 3.5 cm (image 12, series 2). The procedure was planned. A timeout was performed prior to the initiation of the procedure. The skin overlying the right anterior lower abdomen was prepped and draped in the usual sterile fashion. The overlying soft tissues were anesthetized with 1% lidocaine with epinephrine. Appropriate trajectory was planned with the use of a 22 gauge spinal needle. An 18 gauge trocar needle was advanced into the abscess/fluid collection and a short Amplatz super stiff wire was coiled within the collection. Appropriate positioning was confirmed with a limited CT scan. The tract was serially dilated allowing placement of a 10 [place] all-purpose drainage catheter. Appropriate positioning was confirmed with a limited postprocedural CT scan. Approximately 50 Ml of purulent, foul smelling fluid was aspirated. The tube was connected to a drainage bag and sutured in place. A dressing was placed. The patient tolerated the procedure well without immediate post procedural complication. IMPRESSION: Successful CT guided placement of a 10 French all purpose drain catheter into the right lower abdomen with aspiration of 50 mL of purulent, foul smelling fluid. Samples were sent to the laboratory as requested by the ordering clinical team. Electronically Signed   By: Simonne Come M.D.   On: 10/13/2016 12:14   Prior to Admission medications   Medication Sig Start Date End Date Taking? Authorizing Provider  fexofenadine (ALLEGRA) 180 MG tablet Take 180 mg by mouth daily. Rotates  with Claritin for seasonal allergies   Yes Historical Provider, MD  fluconazole (DIFLUCAN) 100 MG tablet Take 1 (one) tablet once if needed for fungal infection. If it does not resolve in 72 hours take the second tablet Patient taking differently: Take 1 (one) tablet once if needed for fungal infection. If it does not resolve in 72 hours take the second tablet. Has on hand when taking antibiotics. 08/27/16  Yes Edson Snowball, PA-C  L-LYSINE PO Take 1 tablet by mouth 2 (two) times daily with a meal. Not sure of strength, possibly 500 mg   Yes Historical Provider, MD  loratadine (CLARITIN) 10 MG tablet Take 10 mg by mouth daily.   Yes Historical Provider, MD  Multiple Vitamin (MULTIVITAMIN WITH MINERALS) TABS tablet Take 1 tablet by mouth daily.   Yes Historical Provider, MD  sodium chloride flush 0.9 % SOLN injection Flush each daily once daily with 5cc normal saline 08/27/16  Yes Brooke A Miller, PA-C  ondansetron (ZOFRAN-ODT) 8 MG disintegrating tablet Take 1 tablet (8 mg total) by mouth every 8 (eight) hours as needed for nausea. Patient not taking: Reported on 10/12/2016 08/18/16   Elvina Sidle, MD  sucralfate (CARAFATE) 1 GM/10ML suspension Take 10 mLs (1 g total) by mouth 2 (two) times daily. Patient not taking: Reported on 10/12/2016 08/19/16   Salley Scarlet, MD    Medications: . enoxaparin (LOVENOX) injection  40 mg Subcutaneous Q24H  . ertapenem  1 g Intravenous Q24H  . fentaNYL      . midazolam      . ondansetron       . 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  Continue Invanz.  Decrease IV rate.  Recheck labs in  AM.          LOS: 1 day    Amaree Leeper 10/13/2016 [phone]

## 2016-10-14 LAB — BASIC METABOLIC PANEL
Anion gap: 7 (ref 5–15)
BUN: 13 mg/dL (ref 6–20)
CHLORIDE: 110 mmol/L (ref 101–111)
CO2: 22 mmol/L (ref 22–32)
Calcium: 8.3 mg/dL — ABNORMAL LOW (ref 8.9–10.3)
Creatinine, Ser: 0.62 mg/dL (ref 0.61–1.24)
GFR calc Af Amer: >60 mL/min (ref >60)
Glucose, Bld: 86 mg/dL (ref 65–99)
POTASSIUM: 3.2 mmol/L — AB (ref 3.5–5.1)
Sodium: 139 mmol/L (ref 135–145)

## 2016-10-14 LAB — CBC
HCT: 36.8 % — ABNORMAL LOW (ref 39.0–52.0)
HEMOGLOBIN: 12.5 g/dL — AB (ref 13.0–17.0)
MCH: 30.6 pg (ref 26.0–34.0)
MCHC: 34 g/dL (ref 30.0–36.0)
MCV: 90.2 fL (ref 78.0–100.0)
Platelets: 178 10*3/uL (ref 150–400)
RBC: 4.08 MIL/uL — ABNORMAL LOW (ref 4.22–5.81)
RDW: 13.1 % (ref 11.5–15.5)
WBC: 10.5 10*3/uL (ref 4.0–10.5)

## 2016-10-14 MED ORDER — SODIUM CHLORIDE 0.9 % IV SOLN: INTRAVENOUS | Status: DC

## 2016-10-14 MED ORDER — POTASSIUM CHLORIDE IN NACL 20-0.9 MEQ/L-% IV SOLN
INTRAVENOUS | Status: DC
  Administered 2016-10-14 (×2): via INTRAVENOUS
  Administered 2016-10-15: 1000 mL via INTRAVENOUS
  Administered 2016-10-15 (×2): via INTRAVENOUS
  Administered 2016-10-16: 125 mL/h via INTRAVENOUS
  Administered 2016-10-16 (×2): via INTRAVENOUS
  Administered 2016-10-17: 50 mL/h via INTRAVENOUS
  Administered 2016-10-17: 07:00:00 via INTRAVENOUS
  Filled 2016-10-14 (×9): qty 1000

## 2016-10-14 NOTE — Progress Notes (Signed)
Patient ID: [name], male   DOB: 06-08-1981, 36 y.o.   MRN: [id number]    Referring Physician(s): Dr. Avel Peace  Supervising Physician: Malachy Moan  Patient Status: [facility] - In-pt  Chief Complaint: Recurrent abdominal abscess  Subjective: Patient is feeling better today after his drain placement, but still with some pain.  Allergies: Keflex [cephalexin]; Penicillins; and Sulfa antibiotics  Medications: Prior to Admission medications   Medication Sig Start Date End Date Taking? Authorizing Provider  fexofenadine (ALLEGRA) 180 MG tablet Take 180 mg by mouth daily. Rotates with Claritin for seasonal allergies   Yes Historical Provider, MD  fluconazole (DIFLUCAN) 100 MG tablet Take 1 (one) tablet once if needed for fungal infection. If it does not resolve in 72 hours take the second tablet Patient taking differently: Take 1 (one) tablet once if needed for fungal infection. If it does not resolve in 72 hours take the second tablet. Has on hand when taking antibiotics. 08/27/16  Yes Edson Snowball, PA-C  L-LYSINE PO Take 1 tablet by mouth 2 (two) times daily with a meal. Not sure of strength, possibly 500 mg   Yes Historical Provider, MD  loratadine (CLARITIN) 10 MG tablet Take 10 mg by mouth daily.   Yes Historical Provider, MD  Multiple Vitamin (MULTIVITAMIN WITH MINERALS) TABS tablet Take 1 tablet by mouth daily.   Yes Historical Provider, MD  sodium chloride flush 0.9 % SOLN injection Flush each daily once daily with 5cc normal saline 08/27/16  Yes Brooke A Miller, PA-C  ondansetron (ZOFRAN-ODT) 8 MG disintegrating tablet Take 1 tablet (8 mg total) by mouth every 8 (eight) hours as needed for nausea. Patient not taking: Reported on 10/12/2016 08/18/16   Elvina Sidle, MD  sucralfate (CARAFATE) 1 GM/10ML suspension Take 10 mLs (1 g total) by mouth 2 (two) times daily. Patient not taking: Reported on 10/12/2016 08/19/16   Salley Scarlet, MD    Vital Signs: BP (!) 106/57  (BP Location: Right Arm)   Pulse 98   Temp 98.6 F (37 C) (Oral)   Resp 18   Ht 6\' 2"  (1.88 m)   Wt 233 lb (105.7 kg)   SpO2 96%   BMI 29.92 kg/m   Physical Exam: abd: soft, still tender in central and RLQ portion of abdomen, ND, drain with serous output. 70cc since placement yesterday.  Imaging: Ct Abdomen Pelvis W Contrast  Result Date: 10/12/2016 CLINICAL DATA:  Recent bowel perforation with drainage. Persistent abdominal pain with nausea, vomiting, and fevers EXAM: CT ABDOMEN AND PELVIS WITH CONTRAST TECHNIQUE: Multidetector CT imaging of the abdomen and pelvis was performed using the standard protocol following bolus administration of intravenous contrast. CONTRAST:  ISOVUE-300 IOPAMIDOL (ISOVUE-300) INJECTION 61% COMPARISON:  September 09, 2016 and August 22, 2016 FINDINGS: Lower chest: There is mild bibasilar atelectasis. A small focus of infiltrate is noted in the inferior lingula, suspected small focus of pneumonia in this area. Hepatobiliary: There is an enhancing focus near the dome of the liver on the right anteriorly measuring 1.0 x 0.9 cm, a stable finding likely representing a small hemangioma. No other focal liver lesion evident. Gallbladder wall is not thickened. There is no biliary duct dilatation. Pancreas: No pancreatic mass or inflammatory focus. Spleen: No splenic lesions are evident. Adrenals/Urinary Tract: Adrenals appear normal bilaterally. Kidneys bilaterally show no mass or hydronephrosis on either side. There is no renal or ureteral calculus. Urinary bladder is midline with wall thickness felt to be within normal limits. Note that there  is stranding in the region of the urinary bladder due to inflammation from adjacent mesenteric and bowel. Stomach/Bowel: There are multiple sigmoid and distal descending colonic diverticulae without diverticulitis. Percutaneous drains have been removed from the pelvis. There is inflammation in the mesentery extensive pelvic  enteritis, primarily involving loops of small bowel appear Of the pelvis which results in inflammation of multiple loops of bowel in the pelvis, particularly to the right of midline. Within this area in the mid right pelvis, there is a focal fluid collection that is difficult to separate from bowel measuring 5.1 x 3.5 x 3.4 cm. This fluid collection is best appreciated on axial slices 78 through 75 series 2, coronal slices 51 through 63 series 5, and sagittal slices 79 through 98 series 6. This fluid collection is consistent with a recurring abscess. Minimal air is seen along the periphery of this fluid collection. There is no frank bowel obstruction, although there is pelvic small bowel enteritis from this apparent abscess and surrounding mesenteric thickening. Elsewhere in the abdomen and pelvis, there are areas of slight loculated fluid without well-defined abscess. There is no appreciable free air or portal venous air. Vascular/Lymphatic: No abdominal aortic aneurysm. No vascular calcifications are evident. There is no appreciable adenopathy in the abdomen or pelvis. Reproductive: Prostate and seminal vesicles appear normal. Other: There is no appendiceal region inflammation. There is no free fluid in the abdomen or pelvis. Musculoskeletal: There are no blastic or lytic bone lesions. No intramuscular or abdominal wall lesions. IMPRESSION: Extensive inflammation involving the mesenteric air of the lower abdomen and pelvis. There is a developing abscess in the mid right pelvis, slightly to the right of midline measuring 5.1 x 3.5 x 3.4 cm. There are a few areas of loculated fluid elsewhere in the abdomen or pelvis which do not appear sufficiently organized to classify as abscesses. There is no free air. Note that there is extensive pelvic bowel inflammation due to mesenteric inflammation and the right mid pelvic abscess. There are multiple colonic diverticula. Primary diverticulitis is not evident. There is no  appendiceal inflammation.  No bowel obstruction. Probable small focus of pneumonia inferior lingula. Electronically Signed   By: Bretta Bang III M.D.   On: 10/12/2016 08:51   Ct Image Guided Drainage By Percutaneous Catheter  Result Date: 10/13/2016 INDICATION: History of colonic perforation, presumably diverticular in etiology, post right trans gluteal approach percutaneous drainage catheter placement on 08/21/2016 followed by drainage catheter placement within the anterior right lower abdominal quadrant on 08/23/2016. The trans gluteal approach drainage catheter was removed following drainage catheter injection on 09/09/2016. The right anterior lower abdominal percutaneous drainage catheter was removed on 09/23/2016 following percutaneous drainage catheter injection. Unfortunately, the patient returned to the emergency department yesterday with worsening abdominal pain with CT imaging demonstrating recurrent indeterminate fluid collection with the right lower abdominal quadrant. Request made for placement of a new right lower quadrant percutaneous drainage catheter. EXAM: CT IMAGE GUIDED DRAINAGE BY PERCUTANEOUS CATHETER COMPARISON:  CT abdomen pelvis -10/12/2016 ; 08/20/2016; CT-guided right trans gluteal approach percutaneous drainage catheter - 08/21/2016; CT-guided right anterior approach percutaneous drainage catheter - 08/23/2016; fluoroscopic guided percutaneous drainage catheter injection - 09/23/2016; 09/09/2016 MEDICATIONS: The patient is currently admitted to the hospital and receiving intravenous antibiotics. The antibiotics were administered within an appropriate time frame prior to the initiation of the procedure. ANESTHESIA/SEDATION: Moderate (conscious) sedation was employed during this procedure. A total of Versed 2 mg and Fentanyl 150 mcg was administered intravenously. Moderate Sedation Time: 17  minutes. The patient's level of consciousness and vital signs were monitored continuously  by radiology nursing throughout the procedure under my direct supervision. CONTRAST:  None COMPLICATIONS: None immediate. PROCEDURE: Informed written consent was obtained from the patient after a discussion of the risks, benefits and alternatives to treatment. The patient was placed supine on the CT gantry and a pre procedural CT was performed re-demonstrating the known abscess/fluid collection within the right lower abdominal quadrant with dominant ill-defined component measuring approximately 4.5 x 3.5 cm (image 12, series 2). The procedure was planned. A timeout was performed prior to the initiation of the procedure. The skin overlying the right anterior lower abdomen was prepped and draped in the usual sterile fashion. The overlying soft tissues were anesthetized with 1% lidocaine with epinephrine. Appropriate trajectory was planned with the use of a 22 gauge spinal needle. An 18 gauge trocar needle was advanced into the abscess/fluid collection and a short Amplatz super stiff wire was coiled within the collection. Appropriate positioning was confirmed with a limited CT scan. The tract was serially dilated allowing placement of a 10 [place] all-purpose drainage catheter. Appropriate positioning was confirmed with a limited postprocedural CT scan. Approximately 50 Ml of purulent, foul smelling fluid was aspirated. The tube was connected to a drainage bag and sutured in place. A dressing was placed. The patient tolerated the procedure well without immediate post procedural complication. IMPRESSION: Successful CT guided placement of a 10 French all purpose drain catheter into the right lower abdomen with aspiration of 50 mL of purulent, foul smelling fluid. Samples were sent to the laboratory as requested by the ordering clinical team. Electronically Signed   By: Simonne ComeJohn  Watts M.D.   On: 10/13/2016 12:14    Labs:  CBC:  Recent Labs  10/12/16 0651 10/12/16 1132 10/13/16 0457 10/14/16 0504  WBC 15.9* 14.4*  14.2* 10.5  HGB 16.2 13.8 13.1 12.5*  HCT 47.0 39.8 38.7* 36.8*  PLT 251 204 195 178    COAGS:  Recent Labs  08/21/16 0455 10/12/16 0651  INR 1.05 1.00  APTT 30  --     BMP:  Recent Labs  08/23/16 0639 08/26/16 0504 10/12/16 0651 10/12/16 1132 10/14/16 0504  NA 140 138 136  --  139  K 3.4* 3.6 3.7  --  3.2*  CL 104 105 106  --  110  CO2 24 25 19*  --  22  GLUCOSE 84 94 139*  --  86  BUN 18 6 20   --  13  CALCIUM 8.6* 8.2* 9.7  --  8.3*  CREATININE 0.66 0.56* 0.96 0.68 0.62  GFRNONAA >60 >60 >60 >60 >60  GFRAA >60 >60 >60 >60 >60    LIVER FUNCTION TESTS:  Recent Labs  08/22/16 0747 08/23/16 0639 08/26/16 0504 10/12/16 0651  BILITOT 2.6* 2.3* 1.4* 2.3*  AST 18 19 19 25   ALT 27 27 31 19   ALKPHOS 47 49 44 71  PROT 5.7* 5.9* 5.5* 7.8  ALBUMIN 3.0* 3.1* 2.9* 4.6    Assessment and Plan: 1. Recurrent abdominal abscess, s/p perc drain on 3/26 -cont drain placement for now -drain irrigation as ordered -follow surgery's plans, but suspect they will still want to try and get this better with a drain prior to surgical intervention if able. -will follow along -CX pending  Electronically Signed: Eliza Grissinger E 10/14/2016, 11:37 AM   I spent a total of 15 Minutes at the the patient's bedside AND on the patient's hospital floor or unit, greater than  50% of which was counseling/coordinating care for recurrent abdominal abscess

## 2016-10-14 NOTE — Progress Notes (Signed)
Patient ID: [name], male   DOB: [birth year], 36 y.o.   MRN: [id number]  [facility]     Subjective: Pain improving. Denies any n/v. He has had multiple loose BM's. Drain working well.  Objective: Vital signs in last 24 hours: Temp:  [98.4 F (36.9 C)-98.6 F (37 C)] 98.6 F (37 C) (03/27 0534) Pulse Rate:  [98-117] 98 (03/27 0534) Resp:  [18-23] 18 (03/27 0534) BP: (105-155)/(57-138) 106/57 (03/27 0534) SpO2:  [93 %-99 %] 96 % (03/27 0534) Last BM Date: 10/14/16  Intake/Output from previous day: 03/26 0701 - 03/27 0700 In: 3533.8 [I.V.:3433.8; IV Piggyback:100] Out: 420 [Urine:350; Drains:70] Intake/Output this shift: No intake/output data recorded.  PE: Gen:  Alert, NAD, pleasant Card:  RRR, no M/G/R heard Pulm:  CTAB, no W/R/R, effort normal Abd: Soft, ND, +BS, RLQ drain with minimal serous drainage, mild suprapubic and RLQ tenderness Ext:  No erythema, edema, or tenderness   Lab Results:   Recent Labs  10/13/16 0457 10/14/16 0504  WBC 14.2* 10.5  HGB 13.1 12.5*  HCT 38.7* 36.8*  PLT 195 178   BMET  Recent Labs  10/12/16 0651 10/12/16 1132 10/14/16 0504  NA 136  --  139  K 3.7  --  3.2*  CL 106  --  110  CO2 19*  --  22  GLUCOSE 139*  --  86  BUN 20  --  13  CREATININE 0.96 0.68 0.62  CALCIUM 9.7  --  8.3*   PT/INR  Recent Labs  10/12/16 0651  LABPROT 13.2  INR 1.00   CMP     Component Value Date/Time   NA 139 10/14/2016 0504   K 3.2 (L) 10/14/2016 0504   CL 110 10/14/2016 0504   CO2 22 10/14/2016 0504   GLUCOSE 86 10/14/2016 0504   BUN 13 10/14/2016 0504   CREATININE 0.62 10/14/2016 0504   CREATININE 1.03 08/19/2016 1056   CALCIUM 8.3 (L) 10/14/2016 0504   PROT 7.8 10/12/2016 0651   ALBUMIN 4.6 10/12/2016 0651   AST 25 10/12/2016 0651   ALT 19 10/12/2016 0651   ALKPHOS 71 10/12/2016 0651   BILITOT 2.3 (H) 10/12/2016 0651   GFRNONAA >60 10/14/2016 0504   GFRAA >60 10/14/2016 0504   Lipase      Component Value Date/Time   LIPASE 13 08/20/2016 0934       Studies/Results: Ct Image Guided Drainage By Percutaneous Catheter  Result Date: 10/13/2016 INDICATION: History of colonic perforation, presumably diverticular in etiology, post right trans gluteal approach percutaneous drainage catheter placement on 08/21/2016 followed by drainage catheter placement within the anterior right lower abdominal quadrant on 08/23/2016. The trans gluteal approach drainage catheter was removed following drainage catheter injection on 09/09/2016. The right anterior lower abdominal percutaneous drainage catheter was removed on 09/23/2016 following percutaneous drainage catheter injection. Unfortunately, the patient returned to the emergency department yesterday with worsening abdominal pain with CT imaging demonstrating recurrent indeterminate fluid collection with the right lower abdominal quadrant. Request made for placement of a new right lower quadrant percutaneous drainage catheter. EXAM: CT IMAGE GUIDED DRAINAGE BY PERCUTANEOUS CATHETER COMPARISON:  CT abdomen pelvis -10/12/2016 ; 08/20/2016; CT-guided right trans gluteal approach percutaneous drainage catheter - 08/21/2016; CT-guided right anterior approach percutaneous drainage catheter - 08/23/2016; fluoroscopic guided percutaneous drainage catheter injection - 09/23/2016; 09/09/2016 MEDICATIONS: The patient is currently admitted to the hospital and receiving intravenous antibiotics. The antibiotics were administered within an appropriate time frame prior to the initiation of the  procedure. ANESTHESIA/SEDATION: Moderate (conscious) sedation was employed during this procedure. A total of Versed 2 mg and Fentanyl 150 mcg was administered intravenously. Moderate Sedation Time: 17 minutes. The patient's level of consciousness and vital signs were monitored continuously by radiology nursing throughout the procedure under my direct supervision. CONTRAST:  None  COMPLICATIONS: None immediate. PROCEDURE: Informed written consent was obtained from the patient after a discussion of the risks, benefits and alternatives to treatment. The patient was placed supine on the CT gantry and a pre procedural CT was performed re-demonstrating the known abscess/fluid collection within the right lower abdominal quadrant with dominant ill-defined component measuring approximately 4.5 x 3.5 cm (image 12, series 2). The procedure was planned. A timeout was performed prior to the initiation of the procedure. The skin overlying the right anterior lower abdomen was prepped and draped in the usual sterile fashion. The overlying soft tissues were anesthetized with 1% lidocaine with epinephrine. Appropriate trajectory was planned with the use of a 22 gauge spinal needle. An 18 gauge trocar needle was advanced into the abscess/fluid collection and a short Amplatz super stiff wire was coiled within the collection. Appropriate positioning was confirmed with a limited CT scan. The tract was serially dilated allowing placement of a 10 [place] all-purpose drainage catheter. Appropriate positioning was confirmed with a limited postprocedural CT scan. Approximately 50 Ml of purulent, foul smelling fluid was aspirated. The tube was connected to a drainage bag and sutured in place. A dressing was placed. The patient tolerated the procedure well without immediate post procedural complication. IMPRESSION: Successful CT guided placement of a 10 French all purpose drain catheter into the right lower abdomen with aspiration of 50 mL of purulent, foul smelling fluid. Samples were sent to the laboratory as requested by the ordering clinical team. Electronically Signed   By: Simonne Come M.D.   On: 10/13/2016 12:14    Anti-infectives: Anti-infectives    Start     Dose/Rate Route Frequency Ordered Stop   10/13/16 1000  levofloxacin (LEVAQUIN) IVPB 750 mg  Status:  Discontinued     750 mg 100 mL/hr over 90  Minutes Intravenous Every 24 hours 10/12/16 0909 10/12/16 0914   10/12/16 1800  metroNIDAZOLE (FLAGYL) IVPB 500 mg  Status:  Discontinued     500 mg 100 mL/hr over 60 Minutes Intravenous Every 8 hours 10/12/16 0909 10/12/16 0914   10/12/16 1000  ertapenem (INVANZ) 1 g in sodium chloride 0.9 % 50 mL IVPB     1 g 100 mL/hr over 30 Minutes Intravenous Every 24 hours 10/12/16 0914     10/12/16 0900  levofloxacin (LEVAQUIN) IVPB 750 mg  Status:  Discontinued     750 mg 100 mL/hr over 90 Minutes Intravenous  Once 10/12/16 0858 10/12/16 0914   10/12/16 0900  metroNIDAZOLE (FLAGYL) IVPB 500 mg  Status:  Discontinued     500 mg 100 mL/hr over 60 Minutes Intravenous  Once 10/12/16 0858 10/12/16 1610       Assessment/Plan 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 Continue Ice chips and sips for now.  Continue IV Invanz. Follow culture. Replace potassium. Recheck labs in AM.     LOS: 2 days  Edson Snowball , [facility] 10/14/2016, 9:56 AM Pager: [phone] Consults: [phone] Mon-Fri 7:00 am-4:30 pm Sat-Sun 7:00 am-11:30 am

## 2016-10-15 LAB — BASIC METABOLIC PANEL
Anion gap: 12 (ref 5–15)
BUN: 10 mg/dL (ref 6–20)
CALCIUM: 8.1 mg/dL — AB (ref 8.9–10.3)
CO2: 18 mmol/L — ABNORMAL LOW (ref 22–32)
CREATININE: 0.55 mg/dL — AB (ref 0.61–1.24)
Chloride: 108 mmol/L (ref 101–111)
GFR calc Af Amer: >60 mL/min (ref >60)
Glucose, Bld: 66 mg/dL (ref 65–99)
Potassium: 3.7 mmol/L (ref 3.5–5.1)
SODIUM: 138 mmol/L (ref 135–145)

## 2016-10-15 LAB — CBC
HCT: 35.4 % — ABNORMAL LOW (ref 39.0–52.0)
Hemoglobin: 12 g/dL — ABNORMAL LOW (ref 13.0–17.0)
MCH: 30.3 pg (ref 26.0–34.0)
MCHC: 33.9 g/dL (ref 30.0–36.0)
MCV: 89.4 fL (ref 78.0–100.0)
PLATELETS: 204 10*3/uL (ref 150–400)
RBC: 3.96 MIL/uL — ABNORMAL LOW (ref 4.22–5.81)
RDW: 12.9 % (ref 11.5–15.5)
WBC: 8.5 10*3/uL (ref 4.0–10.5)

## 2016-10-15 MED ORDER — SACCHAROMYCES BOULARDII 250 MG PO CAPS
250.0000 mg | ORAL_CAPSULE | Freq: Two times a day (BID) | ORAL | Status: DC
  Administered 2016-10-15 – 2016-10-19 (×9): 250 mg via ORAL
  Filled 2016-10-15 (×9): qty 1

## 2016-10-15 NOTE — Progress Notes (Signed)
Nutrition Brief Note  Patient identified on the Malnutrition Screening Tool (MST) Report  Pt with little weight loss. Pt now on a diet. RD will monitor for intakes and nutritional needs.   Wt Readings from Last 15 Encounters:  10/12/16 233 lb (105.7 kg)  08/20/16 235 lb (106.6 kg)  08/19/16 235 lb (106.6 kg)  05/25/15 240 lb (108.9 kg)    Body mass index is 29.92 kg/m. Patient meets criteria for overweight based on current BMI.   Current diet order is full liquids. Diet just advanced. Labs and medications reviewed.   No nutrition interventions warranted at this time. If nutrition issues arise, please consult RD.   Tilda FrancoLindsey Barbar Brede, MS, RD, LDN Pager: [phone] After Hours Pager: [phone]

## 2016-10-15 NOTE — Progress Notes (Signed)
Subjective: His only pain is at the drain site.  No other pain currently.  Dr. Donell BeersByerly said he may need a exploratory laparoscopy at some point for these recurrent abscesses.  He is wondering when that might be.  Objective: Vital signs in last 24 hours: Temp:  [98.4 F (36.9 C)-99.1 F (37.3 C)] 98.4 F (36.9 C) (03/28 0634) Pulse Rate:  [95-104] 95 (03/28 0634) Resp:  [15-18] 15 (03/28 0634) BP: (125-132)/(66-69) 132/69 (03/28 0634) SpO2:  [97 %-100 %] 100 % (03/28 0634) Last BM Date: 10/15/16 NPO 3000 IV Urine x 12 Stool x 9 19 ml from the drain - clear serous fluid in bag currently Intake/Output from previous day: 03/27 0701 - 03/28 0700 In: 3050 [I.V.:3000; IV Piggyback:50] Out: 19 [Drains:19] Intake/Output this shift: No intake/output data recorded.  General appearance: alert, cooperative, no distress and Walking the halls. GI: soft, non-tender; bowel sounds normal; no masses,  no organomegaly and Only discomfort is at the drain site.  Lab Results:   Recent Labs  10/14/16 0504 10/15/16 0456  WBC 10.5 8.5  HGB 12.5* 12.0*  HCT 36.8* 35.4*  PLT 178 204    BMET  Recent Labs  10/14/16 0504 10/15/16 0456  NA 139 138  K 3.2* 3.7  CL 110 108  CO2 22 18*  GLUCOSE 86 66  BUN 13 10  CREATININE 0.62 0.55*  CALCIUM 8.3* 8.1*   PT/INR No results for input(s): LABPROT, INR in the last 72 hours.   Recent Labs Lab 10/12/16 0651  AST 25  ALT 19  ALKPHOS 71  BILITOT 2.3*  PROT 7.8  ALBUMIN 4.6     Lipase     Component Value Date/Time   LIPASE 13 08/20/2016 0934     Studies/Results: Ct Image Guided Drainage By Percutaneous Catheter  Result Date: 10/13/2016 INDICATION: History of colonic perforation, presumably diverticular in etiology, post right trans gluteal approach percutaneous drainage catheter placement on 08/21/2016 followed by drainage catheter placement within the anterior right lower abdominal quadrant on 08/23/2016. The trans gluteal  approach drainage catheter was removed following drainage catheter injection on 09/09/2016. The right anterior lower abdominal percutaneous drainage catheter was removed on 09/23/2016 following percutaneous drainage catheter injection. Unfortunately, the patient returned to the emergency department yesterday with worsening abdominal pain with CT imaging demonstrating recurrent indeterminate fluid collection with the right lower abdominal quadrant. Request made for placement of a new right lower quadrant percutaneous drainage catheter. EXAM: CT IMAGE GUIDED DRAINAGE BY PERCUTANEOUS CATHETER COMPARISON:  CT abdomen pelvis -10/12/2016 ; 08/20/2016; CT-guided right trans gluteal approach percutaneous drainage catheter - 08/21/2016; CT-guided right anterior approach percutaneous drainage catheter - 08/23/2016; fluoroscopic guided percutaneous drainage catheter injection - 09/23/2016; 09/09/2016 MEDICATIONS: The patient is currently admitted to the hospital and receiving intravenous antibiotics. The antibiotics were administered within an appropriate time frame prior to the initiation of the procedure. ANESTHESIA/SEDATION: Moderate (conscious) sedation was employed during this procedure. A total of Versed 2 mg and Fentanyl 150 mcg was administered intravenously. Moderate Sedation Time: 17 minutes. The patient's level of consciousness and vital signs were monitored continuously by radiology nursing throughout the procedure under my direct supervision. CONTRAST:  None COMPLICATIONS: None immediate. PROCEDURE: Informed written consent was obtained from the patient after a discussion of the risks, benefits and alternatives to treatment. The patient was placed supine on the CT gantry and a pre procedural CT was performed re-demonstrating the known abscess/fluid collection within the right lower abdominal quadrant with dominant ill-defined component measuring approximately 4.5  x 3.5 cm (image 12, series 2). The procedure was  planned. A timeout was performed prior to the initiation of the procedure. The skin overlying the right anterior lower abdomen was prepped and draped in the usual sterile fashion. The overlying soft tissues were anesthetized with 1% lidocaine with epinephrine. Appropriate trajectory was planned with the use of a 22 gauge spinal needle. An 18 gauge trocar needle was advanced into the abscess/fluid collection and a short Amplatz super stiff wire was coiled within the collection. Appropriate positioning was confirmed with a limited CT scan. The tract was serially dilated allowing placement of a 10 [place] all-purpose drainage catheter. Appropriate positioning was confirmed with a limited postprocedural CT scan. Approximately 50 Ml of purulent, foul smelling fluid was aspirated. The tube was connected to a drainage bag and sutured in place. A dressing was placed. The patient tolerated the procedure well without immediate post procedural complication. IMPRESSION: Successful CT guided placement of a 10 French all purpose drain catheter into the right lower abdomen with aspiration of 50 mL of purulent, foul smelling fluid. Samples were sent to the laboratory as requested by the ordering clinical team. Electronically Signed   By: Simonne Come M.D.   On: 10/13/2016 12:14    Medications: . enoxaparin (LOVENOX) injection  40 mg Subcutaneous Q24H  . 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  Continue Invanz, add probiotic.      LOS: 3 days    Alivia Cimino 10/15/2016 [phone]

## 2016-10-15 NOTE — Progress Notes (Signed)
Referring Physician(s): Rosenbower,T  Supervising Physician: Jolaine Click  Patient Status:  [facility] - In-pt  Chief Complaint:  Recurrent abdominal abscess  Subjective:  Pt feeling well; has some mild soreness at RLQ drain site but otherwise ok; no N/V; tol liquid diet  Allergies: Keflex [cephalexin]; Penicillins; and Sulfa antibiotics  Medications: Prior to Admission medications   Medication Sig Start Date End Date Taking? Authorizing Provider  fexofenadine (ALLEGRA) 180 MG tablet Take 180 mg by mouth daily. Rotates with Claritin for seasonal allergies   Yes Historical Provider, MD  fluconazole (DIFLUCAN) 100 MG tablet Take 1 (one) tablet once if needed for fungal infection. If it does not resolve in 72 hours take the second tablet Patient taking differently: Take 1 (one) tablet once if needed for fungal infection. If it does not resolve in 72 hours take the second tablet. Has on hand when taking antibiotics. 08/27/16  Yes Edson Snowball, PA-C  L-LYSINE PO Take 1 tablet by mouth 2 (two) times daily with a meal. Not sure of strength, possibly 500 mg   Yes Historical Provider, MD  loratadine (CLARITIN) 10 MG tablet Take 10 mg by mouth daily.   Yes Historical Provider, MD  Multiple Vitamin (MULTIVITAMIN WITH MINERALS) TABS tablet Take 1 tablet by mouth daily.   Yes Historical Provider, MD  sodium chloride flush 0.9 % SOLN injection Flush each daily once daily with 5cc normal saline 08/27/16  Yes Brooke A Miller, PA-C  ondansetron (ZOFRAN-ODT) 8 MG disintegrating tablet Take 1 tablet (8 mg total) by mouth every 8 (eight) hours as needed for nausea. Patient not taking: Reported on 10/12/2016 08/18/16   Elvina Sidle, MD  sucralfate (CARAFATE) 1 GM/10ML suspension Take 10 mLs (1 g total) by mouth 2 (two) times daily. Patient not taking: Reported on 10/12/2016 08/19/16   Salley Scarlet, MD     Vital Signs: BP 132/69 (BP Location: Right Arm)   Pulse 95   Temp 98.4 F (36.9 C) (Oral)    Resp 15   Ht 6\' 2"  (1.88 m)   Wt 233 lb (105.7 kg)   SpO2 100%   BMI 29.92 kg/m   Physical Exam RLQ drain intact, output about 20 cc blood tinged fluid yesterday; drain flushed with sterile NS without difficulty  Imaging: Ct Abdomen Pelvis W Contrast  Result Date: 10/12/2016 CLINICAL DATA:  Recent bowel perforation with drainage. Persistent abdominal pain with nausea, vomiting, and fevers EXAM: CT ABDOMEN AND PELVIS WITH CONTRAST TECHNIQUE: Multidetector CT imaging of the abdomen and pelvis was performed using the standard protocol following bolus administration of intravenous contrast. CONTRAST:  ISOVUE-300 IOPAMIDOL (ISOVUE-300) INJECTION 61% COMPARISON:  September 09, 2016 and August 22, 2016 FINDINGS: Lower chest: There is mild bibasilar atelectasis. A small focus of infiltrate is noted in the inferior lingula, suspected small focus of pneumonia in this area. Hepatobiliary: There is an enhancing focus near the dome of the liver on the right anteriorly measuring 1.0 x 0.9 cm, a stable finding likely representing a small hemangioma. No other focal liver lesion evident. Gallbladder wall is not thickened. There is no biliary duct dilatation. Pancreas: No pancreatic mass or inflammatory focus. Spleen: No splenic lesions are evident. Adrenals/Urinary Tract: Adrenals appear normal bilaterally. Kidneys bilaterally show no mass or hydronephrosis on either side. There is no renal or ureteral calculus. Urinary bladder is midline with wall thickness felt to be within normal limits. Note that there is stranding in the region of the urinary bladder due to inflammation  from adjacent mesenteric and bowel. Stomach/Bowel: There are multiple sigmoid and distal descending colonic diverticulae without diverticulitis. Percutaneous drains have been removed from the pelvis. There is inflammation in the mesentery extensive pelvic enteritis, primarily involving loops of small bowel appear Of the pelvis which results in  inflammation of multiple loops of bowel in the pelvis, particularly to the right of midline. Within this area in the mid right pelvis, there is a focal fluid collection that is difficult to separate from bowel measuring 5.1 x 3.5 x 3.4 cm. This fluid collection is best appreciated on axial slices 78 through 75 series 2, coronal slices 51 through 63 series 5, and sagittal slices 79 through 98 series 6. This fluid collection is consistent with a recurring abscess. Minimal air is seen along the periphery of this fluid collection. There is no frank bowel obstruction, although there is pelvic small bowel enteritis from this apparent abscess and surrounding mesenteric thickening. Elsewhere in the abdomen and pelvis, there are areas of slight loculated fluid without well-defined abscess. There is no appreciable free air or portal venous air. Vascular/Lymphatic: No abdominal aortic aneurysm. No vascular calcifications are evident. There is no appreciable adenopathy in the abdomen or pelvis. Reproductive: Prostate and seminal vesicles appear normal. Other: There is no appendiceal region inflammation. There is no free fluid in the abdomen or pelvis. Musculoskeletal: There are no blastic or lytic bone lesions. No intramuscular or abdominal wall lesions. IMPRESSION: Extensive inflammation involving the mesenteric air of the lower abdomen and pelvis. There is a developing abscess in the mid right pelvis, slightly to the right of midline measuring 5.1 x 3.5 x 3.4 cm. There are a few areas of loculated fluid elsewhere in the abdomen or pelvis which do not appear sufficiently organized to classify as abscesses. There is no free air. Note that there is extensive pelvic bowel inflammation due to mesenteric inflammation and the right mid pelvic abscess. There are multiple colonic diverticula. Primary diverticulitis is not evident. There is no appendiceal inflammation.  No bowel obstruction. Probable small focus of pneumonia inferior  lingula. Electronically Signed   By: Bretta BangWilliam  Woodruff III M.D.   On: 10/12/2016 08:51   Ct Image Guided Drainage By Percutaneous Catheter  Result Date: 10/13/2016 INDICATION: History of colonic perforation, presumably diverticular in etiology, post right trans gluteal approach percutaneous drainage catheter placement on 08/21/2016 followed by drainage catheter placement within the anterior right lower abdominal quadrant on 08/23/2016. The trans gluteal approach drainage catheter was removed following drainage catheter injection on 09/09/2016. The right anterior lower abdominal percutaneous drainage catheter was removed on 09/23/2016 following percutaneous drainage catheter injection. Unfortunately, the patient returned to the emergency department yesterday with worsening abdominal pain with CT imaging demonstrating recurrent indeterminate fluid collection with the right lower abdominal quadrant. Request made for placement of a new right lower quadrant percutaneous drainage catheter. EXAM: CT IMAGE GUIDED DRAINAGE BY PERCUTANEOUS CATHETER COMPARISON:  CT abdomen pelvis -10/12/2016 ; 08/20/2016; CT-guided right trans gluteal approach percutaneous drainage catheter - 08/21/2016; CT-guided right anterior approach percutaneous drainage catheter - 08/23/2016; fluoroscopic guided percutaneous drainage catheter injection - 09/23/2016; 09/09/2016 MEDICATIONS: The patient is currently admitted to the hospital and receiving intravenous antibiotics. The antibiotics were administered within an appropriate time frame prior to the initiation of the procedure. ANESTHESIA/SEDATION: Moderate (conscious) sedation was employed during this procedure. A total of Versed 2 mg and Fentanyl 150 mcg was administered intravenously. Moderate Sedation Time: 17 minutes. The patient's level of consciousness and vital signs were monitored continuously  by radiology nursing throughout the procedure under my direct supervision. CONTRAST:  None  COMPLICATIONS: None immediate. PROCEDURE: Informed written consent was obtained from the patient after a discussion of the risks, benefits and alternatives to treatment. The patient was placed supine on the CT gantry and a pre procedural CT was performed re-demonstrating the known abscess/fluid collection within the right lower abdominal quadrant with dominant ill-defined component measuring approximately 4.5 x 3.5 cm (image 12, series 2). The procedure was planned. A timeout was performed prior to the initiation of the procedure. The skin overlying the right anterior lower abdomen was prepped and draped in the usual sterile fashion. The overlying soft tissues were anesthetized with 1% lidocaine with epinephrine. Appropriate trajectory was planned with the use of a 22 gauge spinal needle. An 18 gauge trocar needle was advanced into the abscess/fluid collection and a short Amplatz super stiff wire was coiled within the collection. Appropriate positioning was confirmed with a limited CT scan. The tract was serially dilated allowing placement of a 10 [place] all-purpose drainage catheter. Appropriate positioning was confirmed with a limited postprocedural CT scan. Approximately 50 Ml of purulent, foul smelling fluid was aspirated. The tube was connected to a drainage bag and sutured in place. A dressing was placed. The patient tolerated the procedure well without immediate post procedural complication. IMPRESSION: Successful CT guided placement of a 10 French all purpose drain catheter into the right lower abdomen with aspiration of 50 mL of purulent, foul smelling fluid. Samples were sent to the laboratory as requested by the ordering clinical team. Electronically Signed   By: Simonne Come M.D.   On: 10/13/2016 12:14    Labs:  CBC:  Recent Labs  10/12/16 1132 10/13/16 0457 10/14/16 0504 10/15/16 0456  WBC 14.4* 14.2* 10.5 8.5  HGB 13.8 13.1 12.5* 12.0*  HCT 39.8 38.7* 36.8* 35.4*  PLT 204 195 178 204     COAGS:  Recent Labs  08/21/16 0455 10/12/16 0651  INR 1.05 1.00  APTT 30  --     BMP:  Recent Labs  08/26/16 0504 10/12/16 0651 10/12/16 1132 10/14/16 0504 10/15/16 0456  NA 138 136  --  139 138  K 3.6 3.7  --  3.2* 3.7  CL 105 106  --  110 108  CO2 25 19*  --  22 18*  GLUCOSE 94 139*  --  86 66  BUN 6 20  --  13 10  CALCIUM 8.2* 9.7  --  8.3* 8.1*  CREATININE 0.56* 0.96 0.68 0.62 0.55*  GFRNONAA >60 >60 >60 >60 >60  GFRAA >60 >60 >60 >60 >60    LIVER FUNCTION TESTS:  Recent Labs  08/22/16 0747 08/23/16 0639 08/26/16 0504 10/12/16 0651  BILITOT 2.6* 2.3* 1.4* 2.3*  AST 18 19 19 25   ALT 27 27 31 19   ALKPHOS 47 49 44 71  PROT 5.7* 5.9* 5.5* 7.8  ALBUMIN 3.0* 3.1* 2.9* 4.6    Assessment and Plan: Recurrent abdominal abscess, s/p perc drain on 3/26 ; AF; WBC nl; hgb stable; creat nl; cx's pend; check f/u CT next week/ drain inj before removal unless surg done in interim; additional plans as per [facility]   Electronically Signed: D. Jeananne Rama 10/15/2016, 2:04 PM   I spent a total of 15 minutes at the the patient's bedside AND on the patient's hospital floor or unit, greater than 50% of which was counseling/coordinating care for abdominal abscess drain    Patient ID: [name], male  DOB: 01-18-1981, 35 y.o.   MRN: [id number]

## 2016-10-16 ENCOUNTER — Inpatient Hospital Stay (HOSPITAL_COMMUNITY): Payer: BLUE CROSS/BLUE SHIELD | Admitting: Anesthesiology

## 2016-10-16 ENCOUNTER — Encounter (HOSPITAL_COMMUNITY): Payer: Self-pay | Admitting: Anesthesiology

## 2016-10-16 ENCOUNTER — Encounter (HOSPITAL_COMMUNITY): Admission: EM | Disposition: A | Discharge status: Home / Self Care | Payer: Self-pay | Source: Home / Self Care

## 2016-10-16 HISTORY — PX: LAPAROSCOPIC SMALL BOWEL RESECTION: SHX5929

## 2016-10-16 LAB — SURGICAL PCR SCREEN
MRSA, PCR: NEGATIVE
Staphylococcus aureus: POSITIVE — AB

## 2016-10-16 SURGERY — EXCISION, SMALL INTESTINE, LAPAROSCOPIC
Anesthesia: General

## 2016-10-16 MED ORDER — FENTANYL CITRATE (PF) 100 MCG/2ML IJ SOLN
INTRAMUSCULAR | Status: AC
  Filled 2016-10-16: qty 2

## 2016-10-16 MED ORDER — SCOPOLAMINE 1 MG/3DAYS TD PT72
MEDICATED_PATCH | TRANSDERMAL | Status: DC | PRN
  Administered 2016-10-16: 1 via TRANSDERMAL

## 2016-10-16 MED ORDER — ENOXAPARIN SODIUM 40 MG/0.4ML ~~LOC~~ SOLN
40.0000 mg | SUBCUTANEOUS | Status: DC
  Administered 2016-10-17 – 2016-10-19 (×3): 40 mg via SUBCUTANEOUS
  Filled 2016-10-16 (×3): qty 0.4

## 2016-10-16 MED ORDER — BUPIVACAINE 0.25 % ON-Q PUMP DUAL CATH 300 ML
300.0000 mL | INJECTION | Status: DC
  Filled 2016-10-16: qty 300

## 2016-10-16 MED ORDER — FENTANYL CITRATE (PF) 100 MCG/2ML IJ SOLN
INTRAMUSCULAR | Status: DC | PRN
  Administered 2016-10-16 (×3): 50 ug via INTRAVENOUS
  Administered 2016-10-16: 100 ug via INTRAVENOUS

## 2016-10-16 MED ORDER — DEXAMETHASONE SODIUM PHOSPHATE 10 MG/ML IJ SOLN
INTRAMUSCULAR | Status: AC
  Filled 2016-10-16: qty 1

## 2016-10-16 MED ORDER — LACTATED RINGERS IV SOLN
INTRAVENOUS | Status: DC
  Administered 2016-10-16: 13:00:00 via INTRAVENOUS
  Administered 2016-10-16: 1000 mL via INTRAVENOUS

## 2016-10-16 MED ORDER — MEPERIDINE HCL 50 MG/ML IJ SOLN: 6.2500 mg | INTRAMUSCULAR | Status: DC | PRN

## 2016-10-16 MED ORDER — MIDAZOLAM HCL 5 MG/5ML IJ SOLN
INTRAMUSCULAR | Status: DC | PRN
  Administered 2016-10-16: 2 mg via INTRAVENOUS

## 2016-10-16 MED ORDER — MIDAZOLAM HCL 2 MG/2ML IJ SOLN
INTRAMUSCULAR | Status: AC
  Filled 2016-10-16: qty 2

## 2016-10-16 MED ORDER — SUGAMMADEX SODIUM 200 MG/2ML IV SOLN
INTRAVENOUS | Status: AC
  Filled 2016-10-16: qty 2

## 2016-10-16 MED ORDER — HYDROMORPHONE 1 MG/ML IV SOLN
INTRAVENOUS | Status: DC
  Administered 2016-10-16: 15:00:00 via INTRAVENOUS
  Administered 2016-10-16: 1.8 mg via INTRAVENOUS
  Administered 2016-10-17 (×2): 1.5 mg via INTRAVENOUS
  Administered 2016-10-17: 2.4 mg via INTRAVENOUS
  Administered 2016-10-17: 0.9 mg via INTRAVENOUS
  Administered 2016-10-17: 2.7 mg via INTRAVENOUS
  Administered 2016-10-17: 1.2 mg via INTRAVENOUS
  Administered 2016-10-18: 1.8 mg via INTRAVENOUS
  Administered 2016-10-18: 3 mg via INTRAVENOUS
  Administered 2016-10-18: 1.2 mg via INTRAVENOUS
  Administered 2016-10-18 (×2): 0.9 mg via INTRAVENOUS
  Administered 2016-10-18: 0.8 mg via INTRAVENOUS
  Administered 2016-10-19: 0.9 mg via INTRAVENOUS
  Administered 2016-10-19: 2.4 mg via INTRAVENOUS
  Administered 2016-10-19: 2.1 mg via INTRAVENOUS
  Filled 2016-10-16: qty 25

## 2016-10-16 MED ORDER — FENTANYL CITRATE (PF) 250 MCG/5ML IJ SOLN
INTRAMUSCULAR | Status: AC
  Filled 2016-10-16: qty 5

## 2016-10-16 MED ORDER — SODIUM CHLORIDE 0.9% FLUSH: 9.0000 mL | INTRAVENOUS | Status: DC | PRN

## 2016-10-16 MED ORDER — METOCLOPRAMIDE HCL 5 MG/ML IJ SOLN: 10.0000 mg | Freq: Once | INTRAMUSCULAR | Status: DC | PRN

## 2016-10-16 MED ORDER — DIPHENHYDRAMINE HCL 12.5 MG/5ML PO ELIX: 12.5000 mg | ORAL_SOLUTION | Freq: Four times a day (QID) | ORAL | Status: DC | PRN

## 2016-10-16 MED ORDER — ONDANSETRON HCL 4 MG/2ML IJ SOLN
4.0000 mg | Freq: Four times a day (QID) | INTRAMUSCULAR | Status: DC | PRN
Start: 2016-10-16 — End: 2016-10-19

## 2016-10-16 MED ORDER — SODIUM CHLORIDE 0.9 % IV SOLN
INTRAVENOUS | Status: DC
  Filled 2016-10-16: qty 6

## 2016-10-16 MED ORDER — DIPHENHYDRAMINE HCL 50 MG/ML IJ SOLN: 12.5000 mg | Freq: Four times a day (QID) | INTRAMUSCULAR | Status: DC | PRN

## 2016-10-16 MED ORDER — HYDROMORPHONE HCL 1 MG/ML IJ SOLN
0.2500 mg | INTRAMUSCULAR | Status: DC | PRN
  Administered 2016-10-16 (×4): 0.5 mg via INTRAVENOUS

## 2016-10-16 MED ORDER — BUPIVACAINE-EPINEPHRINE 0.25% -1:200000 IJ SOLN
INTRAMUSCULAR | Status: DC | PRN
  Administered 2016-10-16: 20 mL

## 2016-10-16 MED ORDER — KETAMINE HCL 10 MG/ML IJ SOLN
INTRAMUSCULAR | Status: AC
  Filled 2016-10-16: qty 1

## 2016-10-16 MED ORDER — ONDANSETRON HCL 4 MG/2ML IJ SOLN
INTRAMUSCULAR | Status: AC
  Filled 2016-10-16: qty 2

## 2016-10-16 MED ORDER — FENTANYL CITRATE (PF) 100 MCG/2ML IJ SOLN
25.0000 ug | INTRAMUSCULAR | Status: DC | PRN
  Administered 2016-10-16 (×2): 50 ug via INTRAVENOUS

## 2016-10-16 MED ORDER — NALOXONE HCL 0.4 MG/ML IJ SOLN: 0.4000 mg | INTRAMUSCULAR | Status: DC | PRN

## 2016-10-16 MED ORDER — HYDROMORPHONE HCL 1 MG/ML IJ SOLN
INTRAMUSCULAR | Status: AC
  Filled 2016-10-16: qty 2

## 2016-10-16 MED ORDER — HYDROMORPHONE 1 MG/ML IV SOLN
INTRAVENOUS | Status: AC
  Filled 2016-10-16: qty 25

## 2016-10-16 MED ORDER — PROPOFOL 10 MG/ML IV BOLUS
INTRAVENOUS | Status: DC | PRN
  Administered 2016-10-16: 180 mg via INTRAVENOUS

## 2016-10-16 MED ORDER — BUPIVACAINE HCL (PF) 0.25 % IJ SOLN
INTRAMUSCULAR | Status: AC
Start: 2016-10-16 — End: 2016-10-16
  Filled 2016-10-16: qty 30

## 2016-10-16 MED ORDER — LIDOCAINE 2% (20 MG/ML) 5 ML SYRINGE
INTRAMUSCULAR | Status: AC
  Filled 2016-10-16: qty 10

## 2016-10-16 MED ORDER — FENTANYL CITRATE (PF) 100 MCG/2ML IJ SOLN: 25.0000 ug | INTRAMUSCULAR | Status: DC | PRN

## 2016-10-16 MED ORDER — SCOPOLAMINE 1 MG/3DAYS TD PT72
MEDICATED_PATCH | TRANSDERMAL | Status: AC
  Filled 2016-10-16: qty 1

## 2016-10-16 MED ORDER — HYDROMORPHONE HCL 1 MG/ML IJ SOLN
INTRAMUSCULAR | Status: AC
  Filled 2016-10-16: qty 1

## 2016-10-16 MED ORDER — LACTATED RINGERS IV SOLN: INTRAVENOUS | Status: DC

## 2016-10-16 MED ORDER — LIDOCAINE 2% (20 MG/ML) 5 ML SYRINGE
INTRAMUSCULAR | Status: DC | PRN
  Administered 2016-10-16: 100 mg via INTRAVENOUS

## 2016-10-16 MED ORDER — DEXAMETHASONE SODIUM PHOSPHATE 10 MG/ML IJ SOLN
INTRAMUSCULAR | Status: DC | PRN
  Administered 2016-10-16: 10 mg via INTRAVENOUS

## 2016-10-16 MED ORDER — SUGAMMADEX SODIUM 200 MG/2ML IV SOLN
INTRAVENOUS | Status: DC | PRN
  Administered 2016-10-16: 200 mg via INTRAVENOUS

## 2016-10-16 MED ORDER — LACTATED RINGERS IV SOLN
INTRAVENOUS | Status: DC
  Administered 2016-10-16: 16:00:00 via INTRAVENOUS

## 2016-10-16 MED ORDER — ROCURONIUM BROMIDE 50 MG/5ML IV SOSY
PREFILLED_SYRINGE | INTRAVENOUS | Status: AC
  Filled 2016-10-16: qty 5

## 2016-10-16 MED ORDER — PROPOFOL 10 MG/ML IV BOLUS
INTRAVENOUS | Status: AC
  Filled 2016-10-16: qty 20

## 2016-10-16 MED ORDER — HYDROMORPHONE HCL 1 MG/ML IJ SOLN
0.2500 mg | INTRAMUSCULAR | Status: DC | PRN
  Administered 2016-10-16 (×2): 0.5 mg via INTRAVENOUS

## 2016-10-16 MED ORDER — ROCURONIUM BROMIDE 10 MG/ML (PF) SYRINGE
PREFILLED_SYRINGE | INTRAVENOUS | Status: DC | PRN
  Administered 2016-10-16 (×3): 10 mg via INTRAVENOUS
  Administered 2016-10-16: 50 mg via INTRAVENOUS
  Administered 2016-10-16: 10 mg via INTRAVENOUS

## 2016-10-16 MED ORDER — KETAMINE HCL 10 MG/ML IJ SOLN
INTRAMUSCULAR | Status: DC | PRN
  Administered 2016-10-16: 20 mg via INTRAVENOUS
  Administered 2016-10-16: 40 mg via INTRAVENOUS
  Administered 2016-10-16: 10 mg via INTRAVENOUS

## 2016-10-16 SURGICAL SUPPLY — 85 items
APPLIER CLIP 5 13 M/L LIGAMAX5 (MISCELLANEOUS)
APPLIER CLIP ROT 10 11.4 M/L (STAPLE)
BLADE EXTENDED COATED 6.5IN (ELECTRODE) ×2 IMPLANT
BLADE HEX COATED 2.75 (ELECTRODE) ×4 IMPLANT
BLADE SURG SZ10 CARB STEEL (BLADE) ×2 IMPLANT
CABLE HIGH FREQUENCY MONO STRZ (ELECTRODE) ×2 IMPLANT
CATH KIT ON-Q SILVERSOAK 7.5IN (CATHETERS) ×4 IMPLANT
CELLS DAT CNTRL 66122 CELL SVR (MISCELLANEOUS) ×1 IMPLANT
CLIP APPLIE 5 13 M/L LIGAMAX5 (MISCELLANEOUS) IMPLANT
CLIP APPLIE ROT 10 11.4 M/L (STAPLE) IMPLANT
COUNTER NEEDLE 20 DBL MAG RED (NEEDLE) ×2 IMPLANT
COVER MAYO STAND STRL (DRAPES) IMPLANT
COVER SURGICAL LIGHT HANDLE (MISCELLANEOUS) ×2 IMPLANT
DECANTER SPIKE VIAL GLASS SM (MISCELLANEOUS) ×2 IMPLANT
DRAIN CHANNEL 19F RND (DRAIN) IMPLANT
DRAPE LAPAROSCOPIC ABDOMINAL (DRAPES) ×2 IMPLANT
DRAPE SHEET LG 3/4 BI-LAMINATE (DRAPES) ×2 IMPLANT
DRAPE UTILITY XL STRL (DRAPES) IMPLANT
DRAPE WARM FLUID 44X44 (DRAPE) ×2 IMPLANT
DRSG OPSITE POSTOP 4X10 (GAUZE/BANDAGES/DRESSINGS) ×2 IMPLANT
DRSG OPSITE POSTOP 4X6 (GAUZE/BANDAGES/DRESSINGS) IMPLANT
DRSG OPSITE POSTOP 4X8 (GAUZE/BANDAGES/DRESSINGS) IMPLANT
DRSG TEGADERM 4X4.75 (GAUZE/BANDAGES/DRESSINGS) IMPLANT
ELECT PENCIL ROCKER SW 15FT (MISCELLANEOUS) ×2 IMPLANT
ELECT REM PT RETURN 15FT ADLT (MISCELLANEOUS) ×2 IMPLANT
ENDOLOOP SUT PDS II  0 18 (SUTURE)
ENDOLOOP SUT PDS II 0 18 (SUTURE) IMPLANT
GAUZE SPONGE 4X4 12PLY STRL (GAUZE/BANDAGES/DRESSINGS) ×2 IMPLANT
GLOVE BIO SURGEON STRL SZ 6 (GLOVE) ×4 IMPLANT
GLOVE INDICATOR 6.5 STRL GRN (GLOVE) ×4 IMPLANT
GOWN STRL REUS W/ TWL XL LVL3 (GOWN DISPOSABLE) ×3 IMPLANT
GOWN STRL REUS W/TWL XL LVL3 (GOWN DISPOSABLE) ×3
HANDLE SUCTION POOLE (INSTRUMENTS) ×1 IMPLANT
IRRIG SUCT STRYKERFLOW 2 WTIP (MISCELLANEOUS) ×2
IRRIGATION SUCT STRKRFLW 2 WTP (MISCELLANEOUS) ×1 IMPLANT
KIT BASIN OR (CUSTOM PROCEDURE TRAY) ×2 IMPLANT
LEGGING LITHOTOMY PAIR STRL (DRAPES) IMPLANT
LUBRICANT JELLY K Y 4OZ (MISCELLANEOUS) IMPLANT
PAD POSITIONING PINK XL (MISCELLANEOUS) ×2 IMPLANT
POSITIONER SURGICAL ARM (MISCELLANEOUS) IMPLANT
RELOAD PROXIMATE 75MM BLUE (ENDOMECHANICALS) ×6 IMPLANT
RTRCTR WOUND ALEXIS 18CM MED (MISCELLANEOUS) ×2
SCISSORS LAP 5X35 DISP (ENDOMECHANICALS) ×2 IMPLANT
SEALER TISSUE G2 STRG ARTC 35C (ENDOMECHANICALS) IMPLANT
SHEARS HARMONIC ACE PLUS 36CM (ENDOMECHANICALS) IMPLANT
SLEEVE SURGEON STRL (DRAPES) IMPLANT
SLEEVE XCEL OPT CAN 5 100 (ENDOMECHANICALS) ×4 IMPLANT
SOLUTION ANTI FOG 6CC (MISCELLANEOUS) ×2 IMPLANT
SPONGE LAP 18X18 X RAY DECT (DISPOSABLE) ×4 IMPLANT
STAPLER GUN LINEAR PROX 60 (STAPLE) ×2 IMPLANT
STAPLER PROXIMATE 75MM BLUE (STAPLE) ×2 IMPLANT
STAPLER VISISTAT 35W (STAPLE) ×2 IMPLANT
SUCTION POOLE HANDLE (INSTRUMENTS) ×2
SUT MNCRL AB 4-0 PS2 18 (SUTURE) IMPLANT
SUT PDS AB 1 CTX 36 (SUTURE) IMPLANT
SUT PDS AB 1 TP1 96 (SUTURE) ×4 IMPLANT
SUT PROLENE 2 0 KS (SUTURE) IMPLANT
SUT SILK 2 0 (SUTURE)
SUT SILK 2 0 SH CR/8 (SUTURE) IMPLANT
SUT SILK 2-0 18XBRD TIE 12 (SUTURE) IMPLANT
SUT SILK 3 0 (SUTURE)
SUT SILK 3 0 SH CR/8 (SUTURE) IMPLANT
SUT SILK 3-0 18XBRD TIE 12 (SUTURE) IMPLANT
SUT VIC AB 2-0 SH 18 (SUTURE) ×2 IMPLANT
SUT VIC AB 3-0 SH 18 (SUTURE) ×2 IMPLANT
SUT VICRYL 2 0 18  UND BR (SUTURE) ×1
SUT VICRYL 2 0 18 UND BR (SUTURE) ×1 IMPLANT
SUT VICRYL 3 0 BR 18  UND (SUTURE) ×1
SUT VICRYL 3 0 BR 18 UND (SUTURE) ×1 IMPLANT
SYR BULB IRRIGATION 50ML (SYRINGE) ×2 IMPLANT
SYS LAPSCP GELPORT 120MM (MISCELLANEOUS)
SYSTEM LAPSCP GELPORT 120MM (MISCELLANEOUS) IMPLANT
TAPE CLOTH 4X10 WHT NS (GAUZE/BANDAGES/DRESSINGS) IMPLANT
TAPE STRIPS DRAPE STRL (GAUZE/BANDAGES/DRESSINGS) ×2 IMPLANT
TOWEL OR 17X26 10 PK STRL BLUE (TOWEL DISPOSABLE) ×4 IMPLANT
TOWEL OR NON WOVEN STRL DISP B (DISPOSABLE) ×4 IMPLANT
TRAY FOLEY W/METER SILVER 16FR (SET/KITS/TRAYS/PACK) ×2 IMPLANT
TRAY LAPAROSCOPIC (CUSTOM PROCEDURE TRAY) ×2 IMPLANT
TROCAR BLADELESS OPT 5 100 (ENDOMECHANICALS) ×2 IMPLANT
TROCAR XCEL BLUNT TIP 100MML (ENDOMECHANICALS) IMPLANT
TROCAR XCEL NON-BLD 11X100MML (ENDOMECHANICALS) IMPLANT
TUBING CONNECTING 10 (TUBING) ×2 IMPLANT
TUBING INSUF HEATED (TUBING) ×2 IMPLANT
TUNNELER SHEATH ON-Q 16GX12 DP (PAIN MANAGEMENT) ×2 IMPLANT
YANKAUER SUCT BULB TIP 10FT TU (MISCELLANEOUS) ×4 IMPLANT

## 2016-10-16 NOTE — Transfer of Care (Signed)
Immediate Anesthesia Transfer of Care Note  Patient: [name]  Procedure(s) Performed: Procedure(s): LAPAROSCOPIC SMALL BOWEL RESECTION POSSIBLE EXPLORATORY LAPAROTOMY AND POSSIBLE OPEN WASH OUT ileocectomy (N/A)  Patient Location: PACU  Anesthesia Type:General  Level of Consciousness:  sedated, patient cooperative and responds to stimulation  Airway & Oxygen Therapy:Patient Spontanous Breathing and Patient connected to face mask oxgen  Post-op Assessment:  Report given to PACU RN and Post -op Vital signs reviewed and stable  Post vital signs:  Reviewed and stable  Last Vitals:  Vitals:   10/16/16 0508 10/16/16 0835  BP: 121/80 122/84  Pulse: 84 80  Resp: 16 15  Temp: 36.7 C 36.4 C    Complications: No apparent anesthesia complications

## 2016-10-16 NOTE — Progress Notes (Signed)
  Subjective: Anxious, ready to have surgery over with. No new complaints. No drainage recorded. What is in the drain is clear serous appearing fluid.  Objective: Vital signs in last 24 hours: Temp:  [98 F (36.7 C)-99.3 F (37.4 C)] 98 F (36.7 C) (03/29 0508) Pulse Rate:  [84-94] 84 (03/29 0508) Resp:  [15-16] 16 (03/29 0508) BP: (121-135)/(80-82) 121/80 (03/29 0508) SpO2:  [96 %-98 %] 96 % (03/29 0508) Last BM Date: 10/15/16 PO 240 IV 3000 Voided 9 No drainage recorded from the IR drain. BM 1 Afebrile vital signs are stable. No labs this a.m. No films. Intake/Output from previous day: 03/28 0701 - 03/29 0700 In: 3345 [P.O.:240; I.V.:3000; IV Piggyback:100] Out: 0  Intake/Output this shift: No intake/output data recorded.  General appearance: alert, cooperative and no distress GI: Soft, remains sore around the drain. No distention, no peritonitis.  Lab Results:   Recent Labs  10/14/16 0504 10/15/16 0456  WBC 10.5 8.5  HGB 12.5* 12.0*  HCT 36.8* 35.4*  PLT 178 204    BMET  Recent Labs  10/14/16 0504 10/15/16 0456  NA 139 138  K 3.2* 3.7  CL 110 108  CO2 22 18*  GLUCOSE 86 66  BUN 13 10  CREATININE 0.62 0.55*  CALCIUM 8.3* 8.1*   PT/INR No results for input(s): LABPROT, INR in the last 72 hours.   Recent Labs Lab 10/12/16 0651  AST 25  ALT 19  ALKPHOS 71  BILITOT 2.3*  PROT 7.8  ALBUMIN 4.6     Lipase     Component Value Date/Time   LIPASE 13 08/20/2016 0934     Studies/Results: No results found.  Medications: . enoxaparin (LOVENOX) injection  40 mg Subcutaneous Q24H  . ertapenem  1 g Intravenous Q24H  . 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 Restart tomorrow at 1800.  Plan: Surgery later this a.m.    LOS: 4 days    [name] 10/16/2016 [phone]

## 2016-10-16 NOTE — Anesthesia Preprocedure Evaluation (Addendum)
Anesthesia Evaluation  Patient identified by MRN, date of birth, ID band Patient awake    Reviewed: Allergy & Precautions, NPO status , Patient's Chart, lab work & pertinent test results  Airway Mallampati: II  TM Distance: >3 FB Neck ROM: Full    Dental no notable dental hx.    Pulmonary neg pulmonary ROS,    Pulmonary exam normal breath sounds clear to auscultation       Cardiovascular negative cardio ROS Normal cardiovascular exam Rhythm:Regular Rate:Normal     Neuro/Psych negative neurological ROS  negative psych ROS   GI/Hepatic negative GI ROS, Neg liver ROS,   Endo/Other  negative endocrine ROS  Renal/GU negative Renal ROS  negative genitourinary   Musculoskeletal negative musculoskeletal ROS (+)   Abdominal   Peds negative pediatric ROS (+)  Hematology negative hematology ROS (+)   Anesthesia Other Findings   Reproductive/Obstetrics negative OB ROS                             Anesthesia Physical Anesthesia Plan  ASA: I  Anesthesia Plan: General   Post-op Pain Management:    Induction: Intravenous, Rapid sequence and Cricoid pressure planned  Airway Management Planned: Oral ETT  Additional Equipment:   Intra-op Plan:   Post-operative Plan: Extubation in OR  Informed Consent: I have reviewed the patients History and Physical, chart, labs and discussed the procedure including the risks, benefits and alternatives for the proposed anesthesia with the patient or authorized representative who has indicated his/her understanding and acceptance.   Dental advisory given  Plan Discussed with: CRNA  Anesthesia Plan Comments:         Anesthesia Quick Evaluation

## 2016-10-16 NOTE — Anesthesia Postprocedure Evaluation (Signed)
Anesthesia Post Note  Patient: [name]  Procedure(s) Performed: Procedure(s) (LRB): LAPAROSCOPIC SMALL BOWEL RESECTION POSSIBLE EXPLORATORY LAPAROTOMY AND POSSIBLE OPEN WASH OUT ileocectomy (N/A)  Patient location during evaluation: PACU Anesthesia Type: General Level of consciousness: awake and alert Pain management: pain level controlled Vital Signs Assessment: post-procedure vital signs reviewed and stable Respiratory status: spontaneous breathing, nonlabored ventilation, respiratory function stable and patient connected to nasal cannula oxygen Cardiovascular status: blood pressure returned to baseline and stable Postop Assessment: no signs of nausea or vomiting Anesthetic complications: no       Last Vitals:  Vitals:   10/16/16 1630 10/16/16 1652  BP: (!) 143/89 (!) 137/92  Pulse: 74 89  Resp: 12 16  Temp: 36.8 C 36.7 C    Last Pain:  Vitals:   10/16/16 1700  TempSrc:   PainSc: 8                  Phillips Groutarignan, [name]

## 2016-10-16 NOTE — Op Note (Signed)
PRE-OPERATIVE DIAGNOSIS: recurrent RLQ abscess  POST-OPERATIVE DIAGNOSIS:  Same  PROCEDURE:  Procedure(s): Diagnostic laparoscopy, drainage of abdominal abscess and open ileocecectomy  SURGEON:  Surgeon(s): Almond LintFaera Awa Bachicha, MD  ASSIST:   Joaquim NamKeela Hyatt, RNFA  ANESTHESIA:   local and general  DRAINS: none   LOCAL MEDICATIONS USED:  BUPIVICAINE   SPECIMEN:  Source of Specimen:  terminal ileum and cecum  DISPOSITION OF SPECIMEN:  PATHOLOGY  COUNTS:  YES  DICTATION: .Dragon Dictation  PLAN OF CARE: back to room on the floor  PATIENT DISPOSITION:  PACU - hemodynamically stable.  FINDINGS:  RLQ abscess with several areas of distal small bowel that appeared to be compromised.    EBL: 150  PROCEDURE:  Pt was identified in the holding area and taken to the OR where he was placed supine on the OR table.  General anesthesia was induced.  A foley catheter was placed.  His abdomen was prepped and draped in sterile fashion.  A time out was performed according to the surgical safety checklist.    Patient was placed into reverse Trendelenburg position and rotated to the right. A left subcostal incision 6 mm in length was created after administration of local. The Optiview trocar was advanced through the abdominal wall under direct visualization. The abdomen was insufflated to a pressure of 15 mmHg. 2 additional following her ports are placed in the office down in. The omentum was taken off the abdominal wall. It was attempted to be pulled up toward the right upper quadrant, but it was very stuck. Decision was made to convert to open.  Lower midline incision was made with a #10 blade. The subcutaneous tissues are divided with cautery. A wound protector was placed. A Balfour was placed to assist with visualization. The omentum was bluntly dissected away and elevated toward the head.  The small bowel was densely adherent at the site of the abscess. The percutaneous abscess drain was removed.  The  small bowel is then gently dissected digitally in order to separate the loops. An abscess cavity was identified and opened up.    The small bowel was then dissected free. The small bowel that was involved in the abscess was removed. There was no evidence of colonic involvement in the abscess. The small bowel is divided with the GIA-75 stapler. The cecum was freed up and stapled. The LigaSure was used to divide the mesentery.  A stapled side-to-side, functional end-to-end anastomosis was created between the terminal ileum and the ascending colon.  The abdomen was irrigated. Gowns and gloves were changed and new Bovie and suction were obtained.  The patient was then irrigated with antibiotic irrigation. The omentum was pulled back down into the pelvis. The arm curettage were placed on either side of the incision and the preperitoneal space. The fascia was then closed using running #1 looped PDS suture. The scan was irrigated and closed with skin staples. The patient was allowed to emerge from anesthesia and taken to the PACU in stable condition. Needle, sponge, and instrument counts were correct 2.

## 2016-10-16 NOTE — Anesthesia Procedure Notes (Signed)
Procedure Name: Intubation Date/Time: 10/16/2016 12:27 PM Performed by: Jalayiah Bibian, Virgel Gess Pre-anesthesia Checklist: Patient identified, Emergency Drugs available, Suction available, Patient being monitored and Timeout performed Patient Re-evaluated:Patient Re-evaluated prior to inductionOxygen Delivery Method: Circle system utilized Preoxygenation: Pre-oxygenation with 100% oxygen Intubation Type: IV induction Ventilation: Mask ventilation without difficulty Laryngoscope Size: Mac and 4 Grade View: Grade II Tube type: Oral Tube size: 7.5 mm Number of attempts: 1 Airway Equipment and Method: Stylet Placement Confirmation: ETT inserted through vocal cords under direct vision,  positive ETCO2,  CO2 detector and breath sounds checked- equal and bilateral Secured at: 22 cm Tube secured with: Tape Dental Injury: Teeth and Oropharynx as per pre-operative assessment

## 2016-10-17 ENCOUNTER — Encounter (HOSPITAL_COMMUNITY): Payer: Self-pay | Admitting: General Surgery

## 2016-10-17 LAB — AEROBIC/ANAEROBIC CULTURE W GRAM STAIN (SURGICAL/DEEP WOUND)

## 2016-10-17 LAB — CULTURE, BLOOD (ROUTINE X 2)
Culture: NO GROWTH
Culture: NO GROWTH

## 2016-10-17 LAB — AEROBIC/ANAEROBIC CULTURE (SURGICAL/DEEP WOUND): CULTURE: NORMAL

## 2016-10-17 MED ORDER — ENSURE ENLIVE PO LIQD
237.0000 mL | Freq: Two times a day (BID) | ORAL | Status: DC
  Administered 2016-10-17 – 2016-10-19 (×5): 237 mL via ORAL

## 2016-10-17 MED ORDER — MUPIROCIN 2 % EX OINT
1.0000 | TOPICAL_OINTMENT | Freq: Two times a day (BID) | CUTANEOUS | Status: DC
Start: 2016-10-17 — End: 2016-10-20
  Administered 2016-10-17 – 2016-10-19 (×6): 1 via NASAL
  Filled 2016-10-17: qty 22

## 2016-10-17 MED ORDER — CHLORHEXIDINE GLUCONATE CLOTH 2 % EX PADS
6.0000 | MEDICATED_PAD | Freq: Every day | CUTANEOUS | Status: DC
  Administered 2016-10-17 – 2016-10-19 (×3): 6 via TOPICAL

## 2016-10-17 NOTE — Progress Notes (Signed)
1 Day Post-Op ileocecectomy  Subjective: Doing well.  Pain controlled.  Passing flatus.  Had a short episode of tachycardia last night   Objective: Vital signs in last 24 hours: Temp:  [97.8 F (36.6 C)-98.6 F (37 C)] 98 F (36.7 C) (03/30 0400) Pulse Rate:  [74-106] 80 (03/30 0600) Resp:  [11-24] 18 (03/30 0437) BP: (136-158)/(79-98) 145/87 (03/30 0600) SpO2:  [95 %-100 %] 98 % (03/30 0437)   Intake/Output from previous day: 03/29 0701 - 03/30 0700 In: 3999.2 [P.O.:120; I.V.:3829.2; IV Piggyback:50] Out: 3475 [Urine:3325; Blood:150] Intake/Output this shift: No intake/output data recorded.   General appearance: alert and cooperative GI: soft, non-distended  Incision: no significant drainage  Lab Results:   Recent Labs  10/15/16 0456  WBC 8.5  HGB 12.0*  HCT 35.4*  PLT 204   BMET  Recent Labs  10/15/16 0456  NA 138  K 3.7  CL 108  CO2 18*  GLUCOSE 66  BUN 10  CREATININE 0.55*  CALCIUM 8.1*   PT/INR No results for input(s): LABPROT, INR in the last 72 hours. ABG No results for input(s): PHART, HCO3 in the last 72 hours.  Invalid input(s): PCO2, PO2  MEDS, Scheduled . enoxaparin (LOVENOX) injection  40 mg Subcutaneous Q24H  . ertapenem  1 g Intravenous Q24H  . HYDROmorphone   Intravenous Q4H  . saccharomyces boulardii  250 mg Oral BID    Studies/Results: No results found.  Assessment: s/p Procedure(s): LAPAROSCOPIC SMALL BOWEL RESECTION POSSIBLE EXPLORATORY LAPAROTOMY AND POSSIBLE OPEN [facility] OUT ileocectomy Patient Active Problem List   Diagnosis Date Noted  . Pelvic abscess in male [facility]) 10/12/2016  . SBO (small bowel obstruction) 08/21/2016  . Small bowel obstruction 08/20/2016      Plan: Advance diet to fulls  Decrease IVF's  Cont OnQ and PCA Ambulate   LOS: 5 days     .Vanita Panda, MD [facility], [place] [phone]   10/17/2016 9:26 AM

## 2016-10-17 NOTE — Progress Notes (Signed)
Patient had an episode of tachycardia.   HR of 120 to 160 approx 1 min.  Patient called nurse. HR rate quickly returned to 118 -  97. Patient guessed that from using the PCA.  Rest of vitals remain stable during this time- 145/87, 18, 100%.

## 2016-10-17 NOTE — Progress Notes (Signed)
Initial Nutrition Assessment  DOCUMENTATION CODES:   Not applicable  INTERVENTION:   Ensure Enlive po BID, each supplement provides 350 kcal and 20 grams of protein  NUTRITION DIAGNOSIS:   Increased nutrient needs related to other (see comment) (recent surgery) as evidenced by increased estimated needs from protein.  GOAL:   Patient will meet greater than or equal to 90% of their needs  MONITOR:   PO intake, Supplement acceptance, Weight trends, Labs, Diet advancement  REASON FOR ASSESSMENT:   Malnutrition Screening Tool    ASSESSMENT:   35 y/o male past medical history is notable for being admitted 08/20/2016 with a pneumoperitoneum as well as abdominal abscesses of unknown etiology. At the time of admission, he had no signs of an acute abdomen or sepsis. He was managed with percutaneous drains. 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 He got well and was discharged 08/27/2016. He followed up in the drain clinic and the fistula was noted to have resolved. The last drain was removed 09/23/2016. 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 Pt reports poor appetite, abdominal pain, nausea, and vomiting for one day pta. Pt with h/o SBO and abdominal abscesses since 07/2016. Pt now s/p diagnostic laparoscopy, drainage of abdominal abscess and open ileocecectomy 3/29. Pt currently eating 50% clear liquid diet; advanced to full liquid diet today. Per chart, pt is weight stable. RD discussed with pt the importance of adequate protein intake. Pt is willing to drink Ensure; RD will order.   Medications reviewed and include: lovenox, hydromorphone, florastor, NaCl w/ KCl  Labs reviewed: creat 0.55(L), Ca 8.1(L)  Nutrition-Focused physical exam completed. Findings are  no fat depletion, no muscle depletion, and no edema.   Diet Order:  Diet full liquid Room service appropriate? Yes; Fluid consistency: Thin  Skin:  Wound (see comment) (abdominal incision )  Last BM:  3/28  Height:   Ht Readings from Last 1 Encounters:  10/12/16 6' 2" (1.88 m)    Weight:   Wt Readings from Last 1 Encounters:  10/12/16 233 lb (105.7 kg)    Ideal Body Weight:  86.3 kg  BMI:  Body mass index is 29.92 kg/m.  Estimated Nutritional Needs:   Kcal:  2200-2500kcal/day   Protein:  112-130g/day   Fluid:  >2L/day   EDUCATION NEEDS:   No education needs identified at this time  Casey Campbell, RD, LDN Pager #- [phone]  

## 2016-10-18 NOTE — Progress Notes (Signed)
2 Days Post-Op ileocecectomy  Subjective: Doing well.  Pain controlled.  Passing some flatus.  Objective: Vital signs in last 24 hours: Temp:  [98.1 F (36.7 C)-99.1 F (37.3 C)] 98.7 F (37.1 C) (03/31 0534) Pulse Rate:  [78-110] 103 (03/31 0534) Resp:  [14-21] 18 (03/31 0800) BP: (107-149)/(66-93) 123/81 (03/31 0534) SpO2:  [94 %-100 %] 99 % (03/31 0800)   Intake/Output from previous day: 03/30 0701 - 03/31 0700 In: 3458.3 [P.O.:1800; I.V.:1658.3] Out: 3480 [Urine:3480] Intake/Output this shift: No intake/output data recorded.   General appearance: alert and cooperative GI: soft, non-distended  Incision: no significant drainage  Lab Results:  No results for input(s): WBC, HGB, HCT, PLT in the last 72 hours. BMET No results for input(s): NA, K, CL, CO2, GLUCOSE, BUN, CREATININE, CALCIUM in the last 72 hours. PT/INR No results for input(s): LABPROT, INR in the last 72 hours. ABG No results for input(s): PHART, HCO3 in the last 72 hours.  Invalid input(s): PCO2, PO2  MEDS, Scheduled . Chlorhexidine Gluconate Cloth  6 each Topical Daily  . enoxaparin (LOVENOX) injection  40 mg Subcutaneous Q24H  . ertapenem  1 g Intravenous Q24H  . feeding supplement (ENSURE ENLIVE)  237 mL Oral BID BM  . HYDROmorphone   Intravenous Q4H  . mupirocin ointment  1 application Nasal BID  . saccharomyces boulardii  250 mg Oral BID    Studies/Results: No results found.  Assessment: s/p Procedure(s): LAPAROSCOPIC SMALL BOWEL RESECTION POSSIBLE EXPLORATORY LAPAROTOMY AND POSSIBLE OPEN [facility] OUT ileocectomy Patient Active Problem List   Diagnosis Date Noted  . Pelvic abscess in male [facility]) 10/12/2016  . SBO (small bowel obstruction) 08/21/2016  . Small bowel obstruction 08/20/2016      Plan: Advance diet to fulls  Decrease IVF's  Cont OnQ and PCA Ambulate   LOS: 6 days     .Ruben Panda, MD [facility], [place] [phone]   10/18/2016 9:07 AM

## 2016-10-19 LAB — CREATININE, SERUM
Creatinine, Ser: 0.74 mg/dL (ref 0.61–1.24)
GFR calc Af Amer: >60 mL/min (ref >60)
GFR calc non Af Amer: >60 mL/min (ref >60)

## 2016-10-19 MED ORDER — OXYCODONE-ACETAMINOPHEN 5-325 MG PO TABS
1.0000 | ORAL_TABLET | ORAL | Status: DC | PRN
  Administered 2016-10-19 (×2): 1 via ORAL
  Filled 2016-10-19 (×2): qty 1

## 2016-10-19 MED ORDER — METRONIDAZOLE 500 MG PO TABS
500.0000 mg | ORAL_TABLET | Freq: Four times a day (QID) | ORAL | Status: DC
  Administered 2016-10-19 – 2016-10-20 (×4): 500 mg via ORAL
  Filled 2016-10-19 (×4): qty 1

## 2016-10-19 MED ORDER — CIPROFLOXACIN HCL 500 MG PO TABS
500.0000 mg | ORAL_TABLET | Freq: Two times a day (BID) | ORAL | Status: DC
  Administered 2016-10-19 – 2016-10-20 (×3): 500 mg via ORAL
  Filled 2016-10-19 (×3): qty 1

## 2016-10-19 MED ORDER — HYDROMORPHONE HCL 1 MG/ML IJ SOLN
0.5000 mg | INTRAMUSCULAR | Status: DC | PRN
  Administered 2016-10-19: 0.5 mg via INTRAVENOUS
  Filled 2016-10-19: qty 1

## 2016-10-19 NOTE — Progress Notes (Signed)
3 Days Post-Op ileocecectomy  Subjective: Doing well.  Pain controlled.  Passing flatus.  Objective: Vital signs in last 24 hours: Temp:  [98.3 F (36.8 C)-99 F (37.2 C)] 98.3 F (36.8 C) (04/01 0203) Pulse Rate:  [96-112] 107 (04/01 0620) Resp:  [14-21] 14 (04/01 0800) BP: (118-133)/(71-88) 121/88 (04/01 0620) SpO2:  [92 %-99 %] 96 % (04/01 0800)   Intake/Output from previous day: 03/31 0701 - 04/01 0700 In: 1556.2 [P.O.:1080; I.V.:376.2; IV Piggyback:100] Out: 1600 [Urine:1600] Intake/Output this shift: No intake/output data recorded.   General appearance: alert and cooperative GI: soft, non-distended  Incision: no significant drainage  Lab Results:  No results for input(s): WBC, HGB, HCT, PLT in the last 72 hours. BMET  Recent Labs  10/19/16 0513  CREATININE 0.74   PT/INR No results for input(s): LABPROT, INR in the last 72 hours. ABG No results for input(s): PHART, HCO3 in the last 72 hours.  Invalid input(s): PCO2, PO2  MEDS, Scheduled . Chlorhexidine Gluconate Cloth  6 each Topical Daily  . ciprofloxacin  500 mg Oral BID  . enoxaparin (LOVENOX) injection  40 mg Subcutaneous Q24H  . feeding supplement (ENSURE ENLIVE)  237 mL Oral BID BM  . metroNIDAZOLE  500 mg Oral Q6H  . mupirocin ointment  1 application Nasal BID  . saccharomyces boulardii  250 mg Oral BID    Studies/Results: No results found.  Assessment: s/p Procedure(s): LAPAROSCOPIC SMALL BOWEL RESECTION POSSIBLE EXPLORATORY LAPAROTOMY AND POSSIBLE OPEN [facility] OUT ileocectomy Patient Active Problem List   Diagnosis Date Noted  . Pelvic abscess in male [facility]) 10/12/2016  . SBO (small bowel obstruction) 08/21/2016  . Small bowel obstruction 08/20/2016      Plan: Advance diet to reg Decrease IVF's  D/C OnQ and PCA PO pain meds Ambulate PO Abx (Cip/Flag).  D/C Invanz.  Recheck cbc in AM   LOS: 7 days     .Vanita Panda, MD [facility],  [place] [phone]   10/19/2016 9:03 AM

## 2016-10-20 LAB — BASIC METABOLIC PANEL
ANION GAP: 6 (ref 5–15)
BUN: 19 mg/dL (ref 6–20)
CO2: 28 mmol/L (ref 22–32)
Calcium: 9.3 mg/dL (ref 8.9–10.3)
Chloride: 102 mmol/L (ref 101–111)
Creatinine, Ser: 0.79 mg/dL (ref 0.61–1.24)
GFR calc Af Amer: >60 mL/min (ref >60)
Glucose, Bld: 97 mg/dL (ref 65–99)
POTASSIUM: 4 mmol/L (ref 3.5–5.1)
SODIUM: 136 mmol/L (ref 135–145)

## 2016-10-20 LAB — CBC
HEMATOCRIT: 39.5 % (ref 39.0–52.0)
HEMOGLOBIN: 13.4 g/dL (ref 13.0–17.0)
MCH: 29.4 pg (ref 26.0–34.0)
MCHC: 33.9 g/dL (ref 30.0–36.0)
MCV: 86.6 fL (ref 78.0–100.0)
Platelets: 321 10*3/uL (ref 150–400)
RBC: 4.56 MIL/uL (ref 4.22–5.81)
RDW: 12.4 % (ref 11.5–15.5)
WBC: 6 10*3/uL (ref 4.0–10.5)

## 2016-10-20 MED ORDER — OXYCODONE-ACETAMINOPHEN 5-325 MG PO TABS: 1.0000 | ORAL_TABLET | Freq: Four times a day (QID) | ORAL | 0 refills | Status: DC | PRN

## 2016-10-20 MED ORDER — METRONIDAZOLE 500 MG PO TABS: 500.0000 mg | ORAL_TABLET | Freq: Four times a day (QID) | ORAL | 0 refills | Status: AC

## 2016-10-20 MED ORDER — ACETAMINOPHEN 325 MG PO TABS: 650.0000 mg | ORAL_TABLET | Freq: Four times a day (QID) | ORAL | Status: DC | PRN

## 2016-10-20 MED ORDER — CIPROFLOXACIN HCL 500 MG PO TABS
500.0000 mg | ORAL_TABLET | Freq: Two times a day (BID) | ORAL | 0 refills | Status: AC
Start: 2016-10-20 — End: 2016-10-23

## 2016-10-20 MED ORDER — SACCHAROMYCES BOULARDII 250 MG PO CAPS: 250.0000 mg | ORAL_CAPSULE | Freq: Two times a day (BID) | ORAL | 0 refills | Status: DC

## 2016-10-20 NOTE — Progress Notes (Signed)
[facility] Progress Note  4 Days Post-Op  Subjective: No complaints. Pain controlled. +flatus and BM. Tolerating regular diet without nausea/vomiting. Ambulating multiple times daily.   Objective: Vital signs in last 24 hours: Temp:  [98 F (36.7 C)-98.7 F (37.1 C)] 98 F (36.7 C) (04/02 0525) Pulse Rate:  [84-98] 84 (04/02 0525) Resp:  [14-16] 16 (04/02 0525) BP: (109-117)/(68-72) 114/68 (04/02 0525) SpO2:  [95 %-98 %] 98 % (04/02 0525) Last BM Date: 10/16/16  Intake/Output from previous day: 04/01 0701 - 04/02 0700 In: 1120 [P.O.:840; I.V.:280] Out: 0  Intake/Output this shift: No intake/output data recorded.  PE: Gen:  Alert, NAD, pleasant Card:  Regular rate and rhythm Pulm:  Non-labored, clear to auscultation bilaterally Abd: Soft, non-tender, non-distended, bowel sounds present in all 4 quadrants, laparotomy incision c/d/I w/ staples in place. No erythema or drainage from incision. Ext:  No erythema, edema, or tenderness   Lab Results:   Recent Labs  10/20/16 0532  WBC 6.0  HGB 13.4  HCT 39.5  PLT 321   BMET  Recent Labs  10/19/16 0513  CREATININE 0.74   CMP     Component Value Date/Time   NA 138 10/15/2016 0456   K 3.7 10/15/2016 0456   CL 108 10/15/2016 0456   CO2 18 (L) 10/15/2016 0456   GLUCOSE 66 10/15/2016 0456   BUN 10 10/15/2016 0456   CREATININE 0.74 10/19/2016 0513   CREATININE 1.03 08/19/2016 1056   CALCIUM 8.1 (L) 10/15/2016 0456   PROT 7.8 10/12/2016 0651   ALBUMIN 4.6 10/12/2016 0651   AST 25 10/12/2016 0651   ALT 19 10/12/2016 0651   ALKPHOS 71 10/12/2016 0651   BILITOT 2.3 (H) 10/12/2016 0651   GFRNONAA >60 10/19/2016 0513   GFRAA >60 10/19/2016 0513   Lipase     Component Value Date/Time   LIPASE 13 08/20/2016 0934   Anti-infectives: Anti-infectives    Start     Dose/Rate Route Frequency Ordered Stop   10/19/16 1200  metroNIDAZOLE (FLAGYL) tablet 500 mg     500 mg Oral Every 6 hours 10/19/16 0903      10/19/16 0930  ciprofloxacin (CIPRO) tablet 500 mg     500 mg Oral 2 times daily 10/19/16 0903     10/16/16 1345  clindamycin (CLEOCIN) 900 mg, gentamicin (GARAMYCIN) 240 mg in sodium chloride 0.9 % 1,000 mL for intraperitoneal lavage  Status:  Discontinued      Intraperitoneal To Surgery 10/16/16 1340 10/16/16 1643   10/13/16 1000  levofloxacin (LEVAQUIN) IVPB 750 mg  Status:  Discontinued     750 mg 100 mL/hr over 90 Minutes Intravenous Every 24 hours 10/12/16 0909 10/12/16 0914   10/12/16 1800  metroNIDAZOLE (FLAGYL) IVPB 500 mg  Status:  Discontinued     500 mg 100 mL/hr over 60 Minutes Intravenous Every 8 hours 10/12/16 0909 10/12/16 0914   10/12/16 1000  ertapenem (INVANZ) 1 g in sodium chloride 0.9 % 50 mL IVPB  Status:  Discontinued     1 g 100 mL/hr over 30 Minutes Intravenous Every 24 hours 10/12/16 0914 10/19/16 0903   10/12/16 0900  levofloxacin (LEVAQUIN) IVPB 750 mg  Status:  Discontinued     750 mg 100 mL/hr over 90 Minutes Intravenous  Once 10/12/16 0858 10/12/16 0914   10/12/16 0900  metroNIDAZOLE (FLAGYL) IVPB 500 mg  Status:  Discontinued     500 mg 100 mL/hr over 60 Minutes Intravenous  Once 10/12/16 0858 10/12/16 0914  Assessment/Plan Recurrent intraabdominal abscess  S/P perc drain 3/26 Dr. Simonne Come S/P Diagnostic laparoscopy, drainage of abdominal abscess and open ileocecectomy 10/16/16, Dr. Almond Lint  -  leukocytosis resolved  - clinically improved: pain controlled, tolerating PO intake, bowel function returned, ambulating  FEN: Regular diet, ensure, BMET pending  ID: Ertapenem 3/25-3/31, cipro/flagyl 4/1 >> VTE: Lovenox, SCD's Pain: Tylenol PRN, oxycodone 5-10 mg PRN, dilaudid PRN breakthrough  Plan: discharge home today on cipro/flagyl to complete one week course.  Follow up with Dr. Donell Beers in 2-3 weeks. Staple removal in 7-10 days.      LOS: 8 days    Adam Phenix , [facility] 10/20/2016, 7:51 AM Pager:  [phone] Consults: [phone] Mon-Fri 7:00 am-4:30 pm Sat-Sun 7:00 am-11:30 am

## 2016-10-20 NOTE — Discharge Instructions (Signed)
[facility]      [facility] [place] [phone]  OPEN ABDOMINAL SURGERY: POST OP INSTRUCTIONS  Always review your discharge instruction sheet given to you by the facility where your surgery was performed.  IF YOU HAVE DISABILITY OR FAMILY LEAVE FORMS, YOU MUST BRING THEM TO THE OFFICE FOR PROCESSING.  PLEASE DO NOT GIVE THEM TO YOUR DOCTOR.  1. A prescription for pain medication may be given to you upon discharge.  Take your pain medication as prescribed, if needed.  If narcotic pain medicine is not needed, then you may take acetaminophen (Tylenol) or ibuprofen (Advil) as needed. 2. Take your usually prescribed medications unless otherwise directed. 3. If you need a refill on your pain medication, please contact your pharmacy. They will contact our office to request authorization.  Prescriptions will not be filled after 5pm or on week-ends. 4. You should follow a light diet the first few days after arrival home, such as soup and crackers, pudding, etc.unless your doctor has advised otherwise. A high-fiber, low fat diet can be resumed as tolerated.   Be sure to include lots of fluids daily. Most patients will experience some swelling and bruising on the chest and neck area.  Ice packs will help.  Swelling and bruising can take several days to resolve 5. Most patients will experience some swelling and bruising in the area of the incision. Ice pack will help. Swelling and bruising can take several days to resolve..  6. It is common to experience some constipation if taking pain medication after surgery.  Increasing fluid intake and taking a stool softener will usually help or prevent this problem from occurring.  A mild laxative (Milk of Magnesia or Miralax) should be taken according to package directions if there are no bowel movements after 48 hours. 7.  You may have steri-strips (small skin tapes) in place directly over the incision.  These strips should be left on the skin for 7-10 days.  If your  surgeon used skin glue on the incision, you may shower in 24 hours.  The glue will flake off over the next 2-3 weeks.  Any sutures or staples will be removed at the office during your follow-up visit. You may find that a light gauze bandage over your incision may keep your staples from being rubbed or pulled. You may shower and replace the bandage daily. 8. ACTIVITIES:  You may resume regular (light) daily activities beginning the next day--such as daily self-care, walking, climbing stairs--gradually increasing activities as tolerated.  You may have sexual intercourse when it is comfortable.  Refrain from any heavy lifting or straining until approved by your doctor. a. You may drive when you no longer are taking prescription pain medication, you can comfortably wear a seatbelt, and you can safely maneuver your car and apply brakes b. Return to Work: ___________________________________ 9. You should see your doctor in the office for a follow-up appointment approximately two weeks after your surgery.  Make sure that you call for this appointment within a day or two after you arrive home to insure a convenient appointment time. OTHER INSTRUCTIONS:  _____________________________________________________________ _____________________________________________________________  WHEN TO CALL YOUR DOCTOR: 1. Fever over 101.0 2. Inability to urinate 3. Nausea and/or vomiting 4. Extreme swelling or bruising 5. Continued bleeding from incision. 6. Increased pain, redness, or drainage from the incision. 7. Difficulty swallowing or breathing 8. Muscle cramping or spasms. 9. Numbness or tingling in hands or feet or around lips.  The clinic staff is available to   answer your questions during regular business hours.  Please don't hesitate to call and ask to speak to one of the nurses if you have concerns.  For further questions, please visit [email or web address]   

## 2016-10-20 NOTE — Progress Notes (Signed)
Discharge instructions discussed with patient and family, verbalized agreement and understanding. Prescription given to patient 

## 2016-10-28 NOTE — Discharge Summary (Signed)
[facility] Discharge Summary   Patient ID: [name] MRN: [id number] DOB/AGE: 04/16/1981 35 y.o.  Admit date: 10/12/2016 Discharge date: 10/20/16  Discharge Diagnosis Patient Active Problem List   Diagnosis Date Noted  . Pelvic abscess in male [facility]) 10/12/2016  . SBO (small bowel obstruction) 08/21/2016  . Small bowel obstruction 08/20/2016    Consultants Interventional radiology - Katherina Right, MD  Imaging: 10/12/16 CT ABD/PELV W/ - Extensive inflammation involving the mesenteric air of the lower abdomen and pelvis. There is a developing abscess in the mid right pelvis, slightly to the right of midline measuring 5.1 x 3.5 x 3.4 cm. There are a few areas of loculated fluid elsewhere in the abdomen or pelvis which do not appear sufficiently organized to classify as abscesses. There is no free air. Note that there is extensive pelvic bowel inflammation due to mesenteric inflammation and the right mid pelvic abscess. There are multiple colonic diverticula. Primary diverticulitis is not evident.  There is no appendiceal inflammation.  No bowel obstruction.  Procedures 10/13/16 Dr. Simonne Come - CT guided placed of a 10 Fr drainage catheter into the right lower abdomen yielding 50 cc of purulent, foul smelling fluid.   10/16/16 Dr. Almond Lint - Diagnostic laparoscopy, drainage of abdominal abscess and open ileocecectomy  Hospital Course:  This is a 36 year old male who began presented on 10/12/16 with 24 hours of severe abdominal pain. Associated symptoms included low-grade fever and nausea and vomiting. His past medical history is notable for being admitted 08/20/2016 with a pneumoperitoneum as well as abdominal abscesses of unknown etiology. At the time of admission, he had no signs of an acute abdomen or sepsis. He was managed with percutaneous drains. 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 He got well and was discharged 08/27/2016. He followed up in the drain clinic and the fistula was noted to have resolved. The last drain was removed 09/23/2016.  Workup was significant for recurrent right pelvic abscess and the patient was admitted to the hospital for IV antibiotics and IR consult. IR placed a percutaneous drain, as above. Patient was slowly improving, not tolerating a regular diet, but there was a high suspicion that his abscess would recur and he would eventually need surgical exploration - patient was offered inpatient exploration with a higher likelihood of converting to an open procedure versus discharge with outpatient follow-up and possible elective surgery. He chose to proceed with above operation on 3/29. Post-operative patients bowel function returned and diet was advanced as tolerated. He was clinically stable for discharge on 10/20/16 with PO abx. He will follow up in our office as below.  I have personally reviewed the patients medication history on the [place] controlled substance database.  Allergies as of 10/20/2016      Reactions   Keflex [cephalexin] Anaphylaxis, Rash   Penicillins Other (See Comments)   Childhood Has patient had a PCN reaction causing immediate rash, facial/tongue/throat swelling, SOB or lightheadedness with hypotension: uknown Has patient had a PCN reaction causing severe rash involving mucus membranes or skin necrosis: unknown Has patient had a PCN reaction that required hospitalization unknown Has patient had a PCN reaction occurring within the last 10 years: uknown If all of the above answers are "NO", then may proceed with Cephalosporin use.   Sulfa Antibiotics Other (See Comments)   Unknown. Childhood Allergy      Medication List  STOP taking these medications   fluconazole 100 MG tablet Commonly known as:  DIFLUCAN   ondansetron 8 MG disintegrating tablet Commonly known as:  ZOFRAN-ODT   sodium chloride flush 0.9 % Soln  injection   sucralfate 1 GM/10ML suspension Commonly known as:  CARAFATE     TAKE these medications   acetaminophen 325 MG tablet Commonly known as:  TYLENOL Take 2 tablets (650 mg total) by mouth every 6 (six) hours as needed for mild pain or headache.   fexofenadine 180 MG tablet Commonly known as:  ALLEGRA Take 180 mg by mouth daily. Rotates with Claritin for seasonal allergies   L-LYSINE PO Take 1 tablet by mouth 2 (two) times daily with a meal. Not sure of strength, possibly 500 mg   loratadine 10 MG tablet Commonly known as:  CLARITIN Take 10 mg by mouth daily.   multivitamin with minerals Tabs tablet Take 1 tablet by mouth daily.   oxyCODONE-acetaminophen 5-325 MG tablet Commonly known as:  PERCOCET/ROXICET Take 1-2 tablets by mouth every 6 (six) hours as needed for moderate pain.   saccharomyces boulardii 250 MG capsule Commonly known as:  FLORASTOR Take 1 capsule (250 mg total) by mouth 2 (two) times daily.     ASK your doctor about these medications   ciprofloxacin 500 MG tablet Commonly known as:  CIPRO Take 1 tablet (500 mg total) by mouth 2 (two) times daily. Ask about: Should I take this medication?   metroNIDAZOLE 500 MG tablet Commonly known as:  FLAGYL Take 1 tablet (500 mg total) by mouth every 6 (six) hours. Ask about: Should I take this medication?        Follow-up Information    [facility], [place]. Go on 10/29/2016.   Specialty:  General Surgery Why:  10:00 AM for staple removal. Please arrive by 9:30 AM to get checked in and fill out any necessary paperwork. Contact information: [address] [place] [address] [phone]       Almond Lint, MD. Call.   Specialty:  General Surgery Why:  to confirm appointment date time. post-operative follow up in 2-3 weeks. Contact information: [address] [place] [address] [phone]          Signed: Hosie Spangle,  [facility] 10/28/2016, 3:14 PM Pager: [phone] Consults: [phone] Mon-Fri 7:00 am-4:30 pm Sat-Sun 7:00 am-11:30 am

## 2016-12-05 ENCOUNTER — Other Ambulatory Visit: Payer: Self-pay | Admitting: General Surgery

## 2016-12-05 ENCOUNTER — Ambulatory Visit
Admission: RE | Admit: 2016-12-05 | Discharge: 2016-12-05 | Disposition: A | Discharge status: Home / Self Care | Payer: BLUE CROSS/BLUE SHIELD | Source: Ambulatory Visit | Attending: General Surgery | Admitting: General Surgery

## 2016-12-05 DIAGNOSIS — R1032 Left lower quadrant pain: Secondary | ICD-10-CM

## 2016-12-05 MED ORDER — IOPAMIDOL (ISOVUE-300) INJECTION 61%: 100.0000 mL | Freq: Once | INTRAVENOUS | Status: DC | PRN

## 2017-01-05 ENCOUNTER — Encounter: Payer: Self-pay | Admitting: General Surgery

## 2017-02-24 ENCOUNTER — Other Ambulatory Visit: Payer: Self-pay | Admitting: General Surgery

## 2017-04-05 ENCOUNTER — Encounter (HOSPITAL_COMMUNITY): Payer: Self-pay | Admitting: Emergency Medicine

## 2017-04-05 ENCOUNTER — Inpatient Hospital Stay (HOSPITAL_COMMUNITY)
Admission: EM | Admit: 2017-04-05 | Discharge: 2017-04-16 | DRG: 872 | Disposition: A | Discharge status: Home / Self Care | Payer: BLUE CROSS/BLUE SHIELD | Attending: Internal Medicine | Admitting: Internal Medicine

## 2017-04-05 DIAGNOSIS — Z888 Allergy status to other drugs, medicaments and biological substances status: Secondary | ICD-10-CM

## 2017-04-05 DIAGNOSIS — E872 Acidosis: Secondary | ICD-10-CM | POA: Diagnosis present

## 2017-04-05 DIAGNOSIS — K56609 Unspecified intestinal obstruction, unspecified as to partial versus complete obstruction: Secondary | ICD-10-CM | POA: Diagnosis present

## 2017-04-05 DIAGNOSIS — Z0189 Encounter for other specified special examinations: Secondary | ICD-10-CM

## 2017-04-05 DIAGNOSIS — E860 Dehydration: Secondary | ICD-10-CM | POA: Diagnosis present

## 2017-04-05 DIAGNOSIS — Z880 Allergy status to penicillin: Secondary | ICD-10-CM

## 2017-04-05 DIAGNOSIS — K529 Noninfective gastroenteritis and colitis, unspecified: Secondary | ICD-10-CM

## 2017-04-05 DIAGNOSIS — K565 Intestinal adhesions [bands], unspecified as to partial versus complete obstruction: Secondary | ICD-10-CM

## 2017-04-05 DIAGNOSIS — R935 Abnormal findings on diagnostic imaging of other abdominal regions, including retroperitoneum: Secondary | ICD-10-CM

## 2017-04-05 DIAGNOSIS — R109 Unspecified abdominal pain: Secondary | ICD-10-CM | POA: Diagnosis not present

## 2017-04-05 DIAGNOSIS — R112 Nausea with vomiting, unspecified: Secondary | ICD-10-CM

## 2017-04-05 DIAGNOSIS — K760 Fatty (change of) liver, not elsewhere classified: Secondary | ICD-10-CM | POA: Diagnosis present

## 2017-04-05 DIAGNOSIS — K635 Polyp of colon: Secondary | ICD-10-CM | POA: Diagnosis present

## 2017-04-05 DIAGNOSIS — Z882 Allergy status to sulfonamides status: Secondary | ICD-10-CM

## 2017-04-05 DIAGNOSIS — E876 Hypokalemia: Secondary | ICD-10-CM | POA: Diagnosis not present

## 2017-04-05 DIAGNOSIS — Z79899 Other long term (current) drug therapy: Secondary | ICD-10-CM

## 2017-04-05 DIAGNOSIS — K5730 Diverticulosis of large intestine without perforation or abscess without bleeding: Secondary | ICD-10-CM | POA: Diagnosis present

## 2017-04-05 DIAGNOSIS — A419 Sepsis, unspecified organism: Secondary | ICD-10-CM | POA: Diagnosis not present

## 2017-04-05 DIAGNOSIS — Z4659 Encounter for fitting and adjustment of other gastrointestinal appliance and device: Secondary | ICD-10-CM

## 2017-04-05 LAB — COMPREHENSIVE METABOLIC PANEL
ALBUMIN: 5 g/dL (ref 3.5–5.0)
ALT: 68 U/L — ABNORMAL HIGH (ref 17–63)
ANION GAP: 11 (ref 5–15)
AST: 34 U/L (ref 15–41)
Alkaline Phosphatase: 78 U/L (ref 38–126)
BILIRUBIN TOTAL: 2.6 mg/dL — AB (ref 0.3–1.2)
BUN: 20 mg/dL (ref 6–20)
CO2: 21 mmol/L — ABNORMAL LOW (ref 22–32)
Calcium: 9.8 mg/dL (ref 8.9–10.3)
Chloride: 105 mmol/L (ref 101–111)
Creatinine, Ser: 0.99 mg/dL (ref 0.61–1.24)
GFR calc non Af Amer: >60 mL/min (ref >60)
GLUCOSE: 125 mg/dL — AB (ref 65–99)
POTASSIUM: 3.7 mmol/L (ref 3.5–5.1)
SODIUM: 137 mmol/L (ref 135–145)
TOTAL PROTEIN: 7.7 g/dL (ref 6.5–8.1)

## 2017-04-05 LAB — CBC
HEMATOCRIT: 48.6 % (ref 39.0–52.0)
HEMOGLOBIN: 17.6 g/dL — AB (ref 13.0–17.0)
MCH: 32 pg (ref 26.0–34.0)
MCHC: 36.2 g/dL — AB (ref 30.0–36.0)
MCV: 88.4 fL (ref 78.0–100.0)
Platelets: 196 10*3/uL (ref 150–400)
RBC: 5.5 MIL/uL (ref 4.22–5.81)
RDW: 12.5 % (ref 11.5–15.5)
WBC: 16.2 10*3/uL — ABNORMAL HIGH (ref 4.0–10.5)

## 2017-04-05 LAB — URINALYSIS, ROUTINE W REFLEX MICROSCOPIC
BILIRUBIN URINE: NEGATIVE
Glucose, UA: NEGATIVE mg/dL
Hgb urine dipstick: NEGATIVE
Ketones, ur: 5 mg/dL — AB
Leukocytes, UA: NEGATIVE
NITRITE: NEGATIVE
PH: 5 (ref 5.0–8.0)
Protein, ur: NEGATIVE mg/dL
SPECIFIC GRAVITY, URINE: 1.028 (ref 1.005–1.030)

## 2017-04-05 LAB — LIPASE, BLOOD: Lipase: 28 U/L (ref 11–51)

## 2017-04-05 MED ORDER — ACETAMINOPHEN 325 MG PO TABS
650.0000 mg | ORAL_TABLET | Freq: Once | ORAL | Status: AC | PRN
  Administered 2017-04-05: 650 mg via ORAL
  Filled 2017-04-05: qty 2

## 2017-04-05 NOTE — ED Notes (Signed)
Pt c/o n/v x 3 and abdominal pain onset today. Hx of small bowel perforation. Denies diarrhea. Last regular bowel movement around 14:30.

## 2017-04-06 ENCOUNTER — Encounter (HOSPITAL_COMMUNITY): Payer: Self-pay

## 2017-04-06 ENCOUNTER — Emergency Department (HOSPITAL_COMMUNITY): Payer: BLUE CROSS/BLUE SHIELD

## 2017-04-06 DIAGNOSIS — Z980 Intestinal bypass and anastomosis status: Secondary | ICD-10-CM | POA: Diagnosis not present

## 2017-04-06 DIAGNOSIS — Z880 Allergy status to penicillin: Secondary | ICD-10-CM | POA: Diagnosis not present

## 2017-04-06 DIAGNOSIS — A419 Sepsis, unspecified organism: Secondary | ICD-10-CM | POA: Diagnosis present

## 2017-04-06 DIAGNOSIS — R112 Nausea with vomiting, unspecified: Secondary | ICD-10-CM | POA: Diagnosis not present

## 2017-04-06 DIAGNOSIS — K5730 Diverticulosis of large intestine without perforation or abscess without bleeding: Secondary | ICD-10-CM | POA: Diagnosis present

## 2017-04-06 DIAGNOSIS — E860 Dehydration: Secondary | ICD-10-CM | POA: Diagnosis present

## 2017-04-06 DIAGNOSIS — E876 Hypokalemia: Secondary | ICD-10-CM | POA: Diagnosis not present

## 2017-04-06 DIAGNOSIS — R935 Abnormal findings on diagnostic imaging of other abdominal regions, including retroperitoneum: Secondary | ICD-10-CM | POA: Diagnosis not present

## 2017-04-06 DIAGNOSIS — K635 Polyp of colon: Secondary | ICD-10-CM | POA: Diagnosis present

## 2017-04-06 DIAGNOSIS — K56609 Unspecified intestinal obstruction, unspecified as to partial versus complete obstruction: Secondary | ICD-10-CM | POA: Diagnosis present

## 2017-04-06 DIAGNOSIS — Z79899 Other long term (current) drug therapy: Secondary | ICD-10-CM | POA: Diagnosis not present

## 2017-04-06 DIAGNOSIS — K529 Noninfective gastroenteritis and colitis, unspecified: Secondary | ICD-10-CM | POA: Diagnosis present

## 2017-04-06 DIAGNOSIS — E872 Acidosis: Secondary | ICD-10-CM | POA: Diagnosis present

## 2017-04-06 DIAGNOSIS — Z882 Allergy status to sulfonamides status: Secondary | ICD-10-CM | POA: Diagnosis not present

## 2017-04-06 DIAGNOSIS — R1084 Generalized abdominal pain: Secondary | ICD-10-CM | POA: Diagnosis not present

## 2017-04-06 DIAGNOSIS — R109 Unspecified abdominal pain: Secondary | ICD-10-CM | POA: Diagnosis present

## 2017-04-06 DIAGNOSIS — Z888 Allergy status to other drugs, medicaments and biological substances status: Secondary | ICD-10-CM | POA: Diagnosis not present

## 2017-04-06 DIAGNOSIS — K760 Fatty (change of) liver, not elsewhere classified: Secondary | ICD-10-CM | POA: Diagnosis present

## 2017-04-06 LAB — GLUCOSE, CAPILLARY
Glucose-Capillary: 94 mg/dL (ref 65–99)
Glucose-Capillary: 100 mg/dL — ABNORMAL HIGH (ref 65–99)
Glucose-Capillary: 108 mg/dL — ABNORMAL HIGH (ref 65–99)

## 2017-04-06 LAB — PROCALCITONIN: Procalcitonin: 0.57 ng/mL

## 2017-04-06 LAB — LACTIC ACID, PLASMA
LACTIC ACID, VENOUS: 1.8 mmol/L (ref 0.5–1.9)
LACTIC ACID, VENOUS: 1.8 mmol/L (ref 0.5–1.9)
Lactic Acid, Venous: 4.3 mmol/L (ref 0.5–1.9)
Lactic Acid, Venous: 2.5 mmol/L (ref 0.5–1.9)

## 2017-04-06 LAB — COMPREHENSIVE METABOLIC PANEL
ALT: 46 U/L (ref 17–63)
ANION GAP: 8 (ref 5–15)
AST: 23 U/L (ref 15–41)
Albumin: 3.4 g/dL — ABNORMAL LOW (ref 3.5–5.0)
Alkaline Phosphatase: 48 U/L (ref 38–126)
BILIRUBIN TOTAL: 2.4 mg/dL — AB (ref 0.3–1.2)
BUN: 17 mg/dL (ref 6–20)
CHLORIDE: 107 mmol/L (ref 101–111)
CO2: 22 mmol/L (ref 22–32)
Calcium: 7.8 mg/dL — ABNORMAL LOW (ref 8.9–10.3)
Creatinine, Ser: 0.89 mg/dL (ref 0.61–1.24)
GFR calc Af Amer: >60 mL/min (ref >60)
Glucose, Bld: 109 mg/dL — ABNORMAL HIGH (ref 65–99)
POTASSIUM: 3.8 mmol/L (ref 3.5–5.1)
Sodium: 137 mmol/L (ref 135–145)
TOTAL PROTEIN: 5.7 g/dL — AB (ref 6.5–8.1)

## 2017-04-06 LAB — CBC WITH DIFFERENTIAL/PLATELET
BASOS ABS: 0 10*3/uL (ref 0.0–0.1)
BASOS PCT: 0 %
EOS PCT: 0 %
Eosinophils Absolute: 0 10*3/uL (ref 0.0–0.7)
HCT: 39.9 % (ref 39.0–52.0)
Hemoglobin: 14.2 g/dL (ref 13.0–17.0)
LYMPHS PCT: 9 %
Lymphs Abs: 1.3 10*3/uL (ref 0.7–4.0)
MCH: 31 pg (ref 26.0–34.0)
MCHC: 35.6 g/dL (ref 30.0–36.0)
MCV: 87.1 fL (ref 78.0–100.0)
MONO ABS: 1 10*3/uL (ref 0.1–1.0)
MONOS PCT: 7 %
NEUTROS PCT: 84 %
Neutro Abs: 12.3 10*3/uL — ABNORMAL HIGH (ref 1.7–7.7)
PLATELETS: 147 10*3/uL — AB (ref 150–400)
RBC: 4.58 MIL/uL (ref 4.22–5.81)
RDW: 12.9 % (ref 11.5–15.5)
WBC: 14.6 10*3/uL — AB (ref 4.0–10.5)

## 2017-04-06 LAB — APTT: APTT: 28 s (ref 24–36)

## 2017-04-06 LAB — I-STAT CG4 LACTIC ACID, ED: Lactic Acid, Venous: 4.04 mmol/L (ref 0.5–1.9)

## 2017-04-06 LAB — PROTIME-INR
INR: 1.15
PROTHROMBIN TIME: 14.6 s (ref 11.4–15.2)

## 2017-04-06 MED ORDER — LEVOFLOXACIN IN D5W 750 MG/150ML IV SOLN
750.0000 mg | INTRAVENOUS | Status: DC
  Administered 2017-04-06 – 2017-04-15 (×10): 750 mg via INTRAVENOUS
  Filled 2017-04-06 (×10): qty 150

## 2017-04-06 MED ORDER — FENTANYL CITRATE (PF) 100 MCG/2ML IJ SOLN
25.0000 ug | INTRAMUSCULAR | Status: DC | PRN
  Administered 2017-04-06 – 2017-04-12 (×19): 25 ug via INTRAVENOUS
  Filled 2017-04-06 (×19): qty 2

## 2017-04-06 MED ORDER — KETOROLAC TROMETHAMINE 15 MG/ML IJ SOLN
15.0000 mg | Freq: Three times a day (TID) | INTRAMUSCULAR | Status: DC | PRN
  Administered 2017-04-06 – 2017-04-07 (×3): 15 mg via INTRAVENOUS
  Filled 2017-04-06 (×3): qty 1

## 2017-04-06 MED ORDER — ONDANSETRON HCL 4 MG/2ML IJ SOLN
4.0000 mg | Freq: Once | INTRAMUSCULAR | Status: DC
Start: 2017-04-06 — End: 2017-04-06

## 2017-04-06 MED ORDER — SODIUM CHLORIDE 0.9 % IV BOLUS (SEPSIS)
1000.0000 mL | Freq: Once | INTRAVENOUS | Status: AC
  Administered 2017-04-06: 1000 mL via INTRAVENOUS

## 2017-04-06 MED ORDER — FENTANYL CITRATE (PF) 100 MCG/2ML IJ SOLN
25.0000 ug | Freq: Once | INTRAMUSCULAR | Status: AC
  Administered 2017-04-06: 25 ug via INTRAVENOUS

## 2017-04-06 MED ORDER — SODIUM CHLORIDE 0.9 % IV SOLN
INTRAVENOUS | Status: DC
  Administered 2017-04-06 – 2017-04-07 (×3): via INTRAVENOUS

## 2017-04-06 MED ORDER — ONDANSETRON HCL 4 MG/2ML IJ SOLN
4.0000 mg | Freq: Four times a day (QID) | INTRAMUSCULAR | Status: DC | PRN
  Administered 2017-04-06 – 2017-04-15 (×12): 4 mg via INTRAVENOUS
  Filled 2017-04-06 (×12): qty 2

## 2017-04-06 MED ORDER — DIPHENHYDRAMINE HCL 50 MG/ML IJ SOLN
25.0000 mg | Freq: Once | INTRAMUSCULAR | Status: AC
  Administered 2017-04-06: 25 mg via INTRAVENOUS
  Filled 2017-04-06: qty 1

## 2017-04-06 MED ORDER — LORAZEPAM 2 MG/ML IJ SOLN: 1.0000 mg | Freq: Once | INTRAMUSCULAR | Status: DC

## 2017-04-06 MED ORDER — SODIUM CHLORIDE 0.9 % IV BOLUS (SEPSIS)
500.0000 mL | Freq: Once | INTRAVENOUS | Status: AC
  Administered 2017-04-06: 500 mL via INTRAVENOUS

## 2017-04-06 MED ORDER — LEVOFLOXACIN IN D5W 750 MG/150ML IV SOLN
750.0000 mg | Freq: Once | INTRAVENOUS | Status: AC
  Administered 2017-04-06: 750 mg via INTRAVENOUS
  Filled 2017-04-06: qty 150

## 2017-04-06 MED ORDER — DEXTROSE 5 % IV SOLN
2.0000 g | Freq: Once | INTRAVENOUS | Status: AC
  Administered 2017-04-06: 2 g via INTRAVENOUS
  Filled 2017-04-06: qty 2

## 2017-04-06 MED ORDER — FENTANYL CITRATE (PF) 100 MCG/2ML IJ SOLN
50.0000 ug | Freq: Once | INTRAMUSCULAR | Status: AC
  Administered 2017-04-06: 50 ug via INTRAVENOUS
  Filled 2017-04-06: qty 2

## 2017-04-06 MED ORDER — METRONIDAZOLE IN NACL 5-0.79 MG/ML-% IV SOLN
500.0000 mg | Freq: Three times a day (TID) | INTRAVENOUS | Status: DC
  Administered 2017-04-06 – 2017-04-16 (×30): 500 mg via INTRAVENOUS
  Filled 2017-04-06 (×31): qty 100

## 2017-04-06 MED ORDER — ONDANSETRON HCL 4 MG PO TABS: 4.0000 mg | ORAL_TABLET | Freq: Four times a day (QID) | ORAL | Status: DC | PRN

## 2017-04-06 MED ORDER — VANCOMYCIN HCL IN DEXTROSE 1-5 GM/200ML-% IV SOLN
1000.0000 mg | Freq: Three times a day (TID) | INTRAVENOUS | Status: DC
  Administered 2017-04-06: 1000 mg via INTRAVENOUS
  Filled 2017-04-06: qty 200

## 2017-04-06 MED ORDER — ACETAMINOPHEN 325 MG PO TABS
650.0000 mg | ORAL_TABLET | Freq: Four times a day (QID) | ORAL | Status: DC | PRN
  Administered 2017-04-07 (×2): 650 mg via ORAL
  Filled 2017-04-06 (×2): qty 2

## 2017-04-06 MED ORDER — ACETAMINOPHEN 650 MG RE SUPP: 650.0000 mg | Freq: Four times a day (QID) | RECTAL | Status: DC | PRN

## 2017-04-06 MED ORDER — PROMETHAZINE HCL 25 MG/ML IJ SOLN
12.5000 mg | Freq: Once | INTRAMUSCULAR | Status: AC
  Administered 2017-04-06: 12.5 mg via INTRAVENOUS
  Filled 2017-04-06: qty 1

## 2017-04-06 MED ORDER — IOPAMIDOL (ISOVUE-300) INJECTION 61%
INTRAVENOUS | Status: AC
  Administered 2017-04-06: 100 mL via INTRAVENOUS
  Filled 2017-04-06: qty 100

## 2017-04-06 MED ORDER — IOPAMIDOL (ISOVUE-300) INJECTION 61%
100.0000 mL | Freq: Once | INTRAVENOUS | Status: AC | PRN
Start: 2017-04-06 — End: 2017-04-06
  Administered 2017-04-06: 100 mL via INTRAVENOUS

## 2017-04-06 MED ORDER — VANCOMYCIN HCL IN DEXTROSE 1-5 GM/200ML-% IV SOLN
1000.0000 mg | Freq: Once | INTRAVENOUS | Status: AC
  Administered 2017-04-06: 1000 mg via INTRAVENOUS
  Filled 2017-04-06: qty 200

## 2017-04-06 MED ORDER — DEXTROSE 5 % IV SOLN
2.0000 g | Freq: Three times a day (TID) | INTRAVENOUS | Status: DC
  Administered 2017-04-06: 2 g via INTRAVENOUS
  Filled 2017-04-06 (×2): qty 2

## 2017-04-06 MED ORDER — FENTANYL CITRATE (PF) 100 MCG/2ML IJ SOLN
50.0000 ug | Freq: Once | INTRAMUSCULAR | Status: DC
  Filled 2017-04-06: qty 2

## 2017-04-06 NOTE — Progress Notes (Signed)
Pharmacy Antibiotic Note  [name] is a 36 y.o. male admitted on 04/05/2017 with sepsis.  Pharmacy has been consulted for Vancomycin, levofloxacin, aztreonam dosing.  Plan: Vancomycin 1gm  IV every 8 hours.  Goal trough 15-20 mcg/mL.  Aztreonam 2gm iv q8hr Levofloxacin  iv q24hr  Height:  (188 cm) Weight: 251 lb 5.2 oz (114 kg) IBW/kg (Calculated) : 82.2  Temp (24hrs), Avg:99.4 F (37.4 C), Min:98 F (36.7 C), Max:101 F (38.3 C)   Recent Labs Lab 04/05/17 2125 04/06/17 0015 04/06/17 0115 04/06/17 0428  WBC 16.2*  --   --  14.6*  CREATININE 0.99  --   --   --   LATICACIDVEN  --  4.04* 4.3*  --     Estimated Creatinine Clearance: 138.5 mL/min (by C-G formula based on SCr of 0.99 mg/dL).    Allergies  Allergen Reactions  . Keflex [Cephalexin] Anaphylaxis and Rash  . Penicillins Other (See Comments)    Childhood Has patient had a PCN reaction causing immediate rash, facial/tongue/throat swelling, SOB or lightheadedness with hypotension: uknown Has patient had a PCN reaction causing severe rash involving mucus membranes or skin necrosis: unknown Has patient had a PCN reaction that required hospitalization unknown Has patient had a PCN reaction occurring within the last 10 years: uknown If all of the above answers are "NO", then may proceed with Cephalosporin use.   . Sulfa Antibiotics Other (See Comments)    Unknown. Childhood Allergy    Antimicrobials this admission: Vancomycin 04/06/2017 >> Aztreonam 04/06/2017 >>  Levofloxacin 04/06/2017 >>  Dose adjustments this admission: -  Microbiology results: pending  Thank you for allowing pharmacy to be a part of this patient's care.  [name] 04/06/2017 5:10 AM

## 2017-04-06 NOTE — ED Notes (Signed)
Pt's mother-in-law was notified of pt's room number ([id number]).

## 2017-04-06 NOTE — H&P (Addendum)
History and Physical    [name] ZOX:[id number] DOB: [birth year] DOA: 04/05/2017  PCP: Donita Brooks, MD  Patient coming from: Home.  Chief Complaint: Abdominal pain nausea vomiting.  HPI: [name] is a 36 y.o. male with history of recurrent pelvic abscess and perforated viscus has had undergone surgery in March of this year presents to ER with abdominal pain nausea and vomiting since yesterday afternoon. Denies any diarrhea. Abdominal pain is diffuse and severe.   ED Course: In the ER CT of the abdomen and pelvis done shows inflammation of the small bowel with possible ileus. No definite abscess. Patient was started on antibiotics after cultures are obtained. Patient lactate levels were elevated so sepsis protocol was started.  Review of Systems: As per HPI, rest all negative.   Past Medical History:  Diagnosis Date  . Legionella pneumonia (HCC)    2009    Past Surgical History:  Procedure Laterality Date  . HERNIA REPAIR    . IR GENERIC HISTORICAL  09/09/2016   IR RADIOLOGIST EVAL & MGMT 09/09/2016 Berdine Dance, MD [facility] INTERV RAD  . IR RADIOLOGIST EVAL & MGMT  09/23/2016  . KNEE SURGERY Left   . LAPAROSCOPIC SMALL BOWEL RESECTION N/A 10/16/2016   Procedure: LAPAROSCOPIC SMALL BOWEL RESECTION POSSIBLE EXPLORATORY LAPAROTOMY AND POSSIBLE OPEN WASH OUT ileocectomy;  Surgeon: Almond Lint, MD;  Location: [facility];  Service: General;  Laterality: N/A;     reports that he has never smoked. He has never used smokeless tobacco. He reports that he does not drink alcohol or use drugs.  Allergies  Allergen Reactions  . Keflex [Cephalexin] Anaphylaxis and Rash  . Penicillins Other (See Comments)    Childhood Has patient had a PCN reaction causing immediate rash, facial/tongue/throat swelling, SOB or lightheadedness with hypotension: uknown Has patient had a PCN reaction causing severe rash involving mucus membranes or skin necrosis: unknown Has patient had a PCN reaction  that required hospitalization unknown Has patient had a PCN reaction occurring within the last 10 years: uknown If all of the above answers are "NO", then may proceed with Cephalosporin use.   . Sulfa Antibiotics Other (See Comments)    Unknown. Childhood Allergy    Family History  Problem Relation Age of Onset  . Diabetes Father   . Cancer Paternal Grandfather        oral cancer  . Mental illness Mother     Prior to Admission medications   Medication Sig Start Date End Date Taking? Authorizing Provider  acetaminophen (TYLENOL) 325 MG tablet Take 2 tablets (650 mg total) by mouth every 6 (six) hours as needed for mild pain or headache. 10/20/16  Yes Simaan, Francine Graven, PA-C  loratadine (CLARITIN) 10 MG tablet Take 10 mg by mouth daily.   Yes [provider]  saccharomyces boulardii (FLORASTOR) 250 MG capsule Take 1 capsule (250 mg total) by mouth 2 (two) times daily. 10/20/16  Yes Adam Phenix, PA-C  oxyCODONE-acetaminophen (PERCOCET/ROXICET) 5-325 MG tablet Take 1-2 tablets by mouth every 6 (six) hours as needed for moderate pain. Patient not taking: Reported on 04/05/2017 10/20/16   Adam Phenix, PA-C    Physical Exam: Vitals:   04/06/17 0200 04/06/17 0230 04/06/17 0300 04/06/17 0338  BP: 120/77 127/79 110/65 136/76  Pulse: (!) 113 (!) 110 (!) 118 (!) 108  Resp: (!) 21 18    Temp:    98 F (36.7 C)  TempSrc:    Oral  SpO2:    99%  Weight:    114 kg (251 lb 5.2 oz)  Height:     (1.88 m)      Constitutional: Moderately built and nourished. Vitals:   04/06/17 0200 04/06/17 0230 04/06/17 0300 04/06/17 0338  BP: 120/77 127/79 110/65 136/76  Pulse: (!) 113 (!) 110 (!) 118 (!) 108  Resp: (!) 21 18    Temp:    98 F (36.7 C)  TempSrc:    Oral  SpO2:    99%  Weight:    114 kg (251 lb 5.2 oz)  Height:     (1.88 m)   Eyes: Anicteric no pallor. ENMT: No discharge from the ears eyes nose and mouth. Neck: No mass felt. No JVD  appreciated. Respiratory: No rhonchi or crepitations. Cardiovascular: S1 and S2 heard no murmurs appreciated. Abdomen: Diffuse abdominal tenderness no guarding or rigidity. Musculoskeletal: No edema. Skin: No rash. Neurologic: Alert awake oriented to time place and person. Moves all extremities. Psychiatric: Appears normal. Normal affect.   Labs on Admission: I have personally reviewed following labs and imaging studies  CBC:  Recent Labs Lab 04/05/17 2125  WBC 16.2*  HGB 17.6*  HCT 48.6  MCV 88.4  PLT 196   Basic Metabolic Panel:  Recent Labs Lab 04/05/17 2125  NA 137  K 3.7  CL 105  CO2 21*  GLUCOSE 125*  BUN 20  CREATININE 0.99  CALCIUM 9.8   GFR: Estimated Creatinine Clearance: 138.5 mL/min (by C-G formula based on SCr of 0.99 mg/dL). Liver Function Tests:  Recent Labs Lab 04/05/17 2125  AST 34  ALT 68*  ALKPHOS 78  BILITOT 2.6*  PROT 7.7  ALBUMIN 5.0    Recent Labs Lab 04/05/17 2125  LIPASE 28   No results for input(s): AMMONIA in the last 168 hours. Coagulation Profile: No results for input(s): INR, PROTIME in the last 168 hours. Cardiac Enzymes: No results for input(s): CKTOTAL, CKMB, CKMBINDEX, TROPONINI in the last 168 hours. BNP (last 3 results) No results for input(s): PROBNP in the last 8760 hours. HbA1C: No results for input(s): HGBA1C in the last 72 hours. CBG: No results for input(s): GLUCAP in the last 168 hours. Lipid Profile: No results for input(s): CHOL, HDL, LDLCALC, TRIG, CHOLHDL, LDLDIRECT in the last 72 hours. Thyroid Function Tests: No results for input(s): TSH, T4TOTAL, FREET4, T3FREE, THYROIDAB in the last 72 hours. Anemia Panel: No results for input(s): VITAMINB12, FOLATE, FERRITIN, TIBC, IRON, RETICCTPCT in the last 72 hours. Urine analysis:    Component Value Date/Time   COLORURINE YELLOW 04/05/2017 2105   APPEARANCEUR CLEAR 04/05/2017 2105   LABSPEC 1.028 04/05/2017 2105   PHURINE 5.0 04/05/2017 2105    GLUCOSEU NEGATIVE 04/05/2017 2105   HGBUR NEGATIVE 04/05/2017 2105   BILIRUBINUR NEGATIVE 04/05/2017 2105   KETONESUR 5 (A) 04/05/2017 2105   PROTEINUR NEGATIVE 04/05/2017 2105   NITRITE NEGATIVE 04/05/2017 2105   LEUKOCYTESUR NEGATIVE 04/05/2017 2105   Sepsis Labs: (procalcitonin:4,lacticidven:4) )No results found for this or any previous visit (from the past 240 hour(s)).   Radiological Exams on Admission: Ct Abdomen Pelvis W Contrast  Result Date: 04/06/2017 CLINICAL DATA:  Abdominal pain with nausea and vomiting history of small bowel or section and perforation EXAM: CT ABDOMEN AND PELVIS WITH CONTRAST TECHNIQUE: Multidetector CT imaging of the abdomen and pelvis was performed using the standard protocol following bolus administration of intravenous contrast. CONTRAST:  100 mL Isovue-300 intravenous COMPARISON:  12/05/2016, 10/13/2016, 08/20/2016 FINDINGS: Lower chest: Lung bases demonstrate patchy dependent  atelectasis. Normal heart size. Hepatobiliary: Hepatic steatosis. Stable hyperenhancing focus in the dome of the liver. No calcified gallstones. No biliary dilatation Pancreas: Unremarkable. No pancreatic ductal dilatation or surrounding inflammatory changes. Spleen: Normal in size without focal abnormality. Adrenals/Urinary Tract: Adrenal glands are unremarkable. Kidneys are normal, without renal calculi, focal lesion, or hydronephrosis. Bladder is unremarkable. Stomach/Bowel: The stomach is nonenlarged. Focally dilated segment of fluid-filled small bowel within the central pelvis. Postsurgical changes in the right lower quadrant. Mild wall thickening of the transverse colon. Sigmoid colon diverticula without acute inflammation. Vascular/Lymphatic: No significant vascular findings are present. No enlarged abdominal or pelvic lymph nodes. Reproductive: Prostate is unremarkable. Other: Negative for free air or free fluid. Moderate infiltration and inflammation within the anterior  pelvic mesentery. Inflammatory process within the right lower quadrant/ upper pelvis, poorly defined, appears to be centered around non dilated small bowel loops. Edema and inflammation within the central mesentery. Musculoskeletal: No acute or suspicious bone lesion. Trace retrolisthesis of L5 on S1 IMPRESSION: 1. Moderate inflammatory changes within the abdomen and upper pelvis with poorly defined inflammatory process in the right lower quadrant/upper pelvis. This appears to be centered around non dilated loops of small bowel, suggesting small bowel inflammation, possibly due to inflammatory bowel disease. Dilated segment of small bowel within the upper pelvis could relate to localized ileus. Mild diffuse generalized inflammation within the peritoneal cavity/central mesentery. No free air at this time. No organized abscess. There are mild changes of the transverse colon which could be due to mild colitis. 2. Sigmoid colon diverticular disease. 3. Hepatic steatosis. Stable small hyperenhancing focus in the dome of the liver, possibly a flash hemangioma. Electronically Signed   By: Jasmine Pang M.D.   On: 04/06/2017 01:21    EKG: Independently reviewed. Sinus tachycardia.  Assessment/Plan Principal Problem:   Sepsis (HCC) Active Problems:   Colitis    1. Sepsis likely from intra-abdominal source - patient has been placed on vancomycin and Levaquin and Azactam. Follow cultures. Continue with aggressive IV hydration and follow lactate levels procalcitonin levels. I have discussed with Dr. Johna Sheriff on-call general surgeon who will be seeing patient in consult. Patient will be kept nothing by mouth. I also place patient on when necessary pain medications. Since the differentials include inflammatory bowel disease patient may need gastroenterology consult.  I have reviewed patient's old charts and labs. Discussed with on-call general surgeon.   DVT prophylaxis: SCDs. Code Status: Full code.  Family  Communication: Discussed with patient.  Disposition Plan: Home.  Consults called: General surgery.  Admission status: Inpatient.    Eduard Clos MD [facility] Pager [phone].  If 7PM-7AM, please contact night-coverage [email or web address] Password [email or web address]  04/06/2017, 4:19 AM

## 2017-04-06 NOTE — Progress Notes (Signed)
CRITICAL VALUE ALERT  Critical Value:  Lactic Acid 2.5  Date & Time Notied:  0556 on 04/06/17  Provider Notified: Toniann Fail  Orders Received/Actions taken: 1L Bolus

## 2017-04-06 NOTE — ED Notes (Signed)
Please call report to [facility] [phone] at [facility]

## 2017-04-06 NOTE — Progress Notes (Signed)
  PROGRESS NOTE  Patient admitted earlier this morning. See H&P. [name] is a 36 yo male with history of recurrent pelvic abscess and perforated viscus s/p ileocecectomy in early 2018. He presented to the ED with chief complaint of abdominal pain, nausea, vomiting without diarrhea. In the ER, CT of the abdomen and pelvis showed inflammation of the small bowel with possible ileus, colitis. No definite abscess. He was febrile, tachycardic with leukocytosis and lactic acidosis of 4.3. Patient was started on empiric antibiotics, admitted for further treatment.  Gen. surgery consulted Continue empiric Levaquin, Flagyl Bowel rest  Blood culture pending  IVF    Noralee Stain, DO [facility] [email or web address] Password [email or web address] 04/06/2017, 1:05 PM

## 2017-04-06 NOTE — ED Provider Notes (Signed)
[facility] DEPT Provider Note   CSN: [id number] Arrival date & time: 04/05/17  2015     History   Chief Complaint Chief Complaint  Patient presents with  . Abdominal Pain    HPI [name] is a 36 y.o. male.  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 Patient is a pain woke him up from sleep. The patient reports associated nausea and vomiting. Patient had 3 episodes of nonbloody emesis prior to, to the ED. Denies any change in bowel habits including diarrhea, constipation, melena, hematochezia. Patient states his last bowel movement was yesterday and was unremarkable. Patient denies any associated fever at home or chills. Denies any associated urinary symptoms. The patient states that movement and palpation make the pain worse. Holding still makes the pain better. He has not taken any for her symptoms prior to arrival. States this feels similar to his small bowel perforation that required surgical intervention in the past. Denies any sick contacts.  Pt denies any fever, chill, ha, vision changes, lightheadedness, dizziness, congestion, neck pain, cp, sob, cough, urinary symptoms, change in bowel habits, melena, hematochezia, lower extremity paresthesias.  Past Medical History:  Diagnosis Date  . Legionella pneumonia [facility])    2009    Patient Active Problem List   Diagnosis Date Noted  . Sepsis (HCC) 04/06/2017  . Colitis 04/06/2017  . Pelvic abscess in male [facility]) 10/12/2016  . SBO (small bowel obstruction) (HCC) 08/21/2016  . Small bowel obstruction (HCC) 08/20/2016    Past Surgical History:  Procedure Laterality Date  . HERNIA REPAIR    . IR GENERIC HISTORICAL  09/09/2016   IR RADIOLOGIST EVAL & MGMT 09/09/2016 Berdine Dance, MD [facility] INTERV RAD  . IR RADIOLOGIST EVAL & MGMT  09/23/2016  . KNEE SURGERY Left   . LAPAROSCOPIC SMALL BOWEL RESECTION N/A 10/16/2016   Procedure: LAPAROSCOPIC SMALL BOWEL RESECTION POSSIBLE EXPLORATORY LAPAROTOMY AND POSSIBLE OPEN WASH OUT ileocectomy;  Surgeon: Almond Lint, MD;  Location: [facility] ORS;  Service: General;  Laterality: N/A;       Home Medications    Prior to Admission medications   Medication Sig Start Date End Date Taking? Authorizing Provider  acetaminophen (TYLENOL) 325 MG tablet Take 2 tablets (650 mg total) by mouth every 6 (six) hours as needed for mild pain or headache. 10/20/16  Yes Simaan, Francine Graven, PA-C  loratadine (CLARITIN) 10 MG tablet Take 10 mg by mouth daily.   Yes [provider]  saccharomyces boulardii (FLORASTOR) 250 MG capsule Take 1 capsule (250 mg total) by mouth 2 (two) times daily. 10/20/16  Yes Adam Phenix, PA-C  oxyCODONE-acetaminophen (PERCOCET/ROXICET) 5-325 MG tablet Take 1-2 tablets by mouth every 6 (six) hours as needed for moderate pain. Patient not taking: Reported on 04/05/2017 10/20/16   Adam Phenix, PA-C    Family History Family History  Problem Relation Age of Onset  . Diabetes Father   . Cancer Paternal Grandfather        oral cancer  . Mental illness Mother     Social History Social History  Substance Use Topics  . Smoking status: Never Smoker  . Smokeless tobacco: Never Used  . Alcohol use No     Allergies   Keflex [cephalexin]; Penicillins; and Sulfa antibiotics   Review of Systems Review of  Systems  Constitutional: Negative for chills and fever.  HENT: Negative for congestion and sore throat.   Eyes: Negative for visual disturbance.  Respiratory: Negative for cough and shortness of breath.   Cardiovascular: Negative for chest pain.  Gastrointestinal: Positive for abdominal pain, nausea and vomiting. Negative for blood in stool, constipation and diarrhea.  Genitourinary: Negative for  dysuria, flank pain, frequency, hematuria, scrotal swelling, testicular pain and urgency.  Musculoskeletal: Negative for arthralgias and myalgias.  Skin: Negative for rash.  Neurological: Negative for dizziness, syncope, weakness, light-headedness, numbness and headaches.  Psychiatric/Behavioral: Negative for sleep disturbance. The patient is not nervous/anxious.      Physical Exam Updated Vital Signs BP 112/70 (BP Location: Right Arm)   Pulse (!) 108   Temp 98.4 F (36.9 C) (Oral)   Resp 16   Ht 6\' 2"  (1.88 m)   Wt 114 kg (251 lb 5.2 oz)   SpO2 98%   BMI 32.27 kg/m   Physical Exam  Constitutional: He is oriented to person, place, and time. He appears well-developed and well-nourished.  Non-toxic appearance. He appears distressed (due to pain).  HENT:  Head: Normocephalic and atraumatic.  Mouth/Throat: Oropharynx is clear and moist.  Eyes: Pupils are equal, round, and reactive to light. Conjunctivae are normal. Right eye exhibits no discharge. Left eye exhibits no discharge.  Neck: Normal range of motion. Neck supple.  Cardiovascular: Regular rhythm, normal heart sounds and intact distal pulses.  Tachycardia present.  Exam reveals no gallop and no friction rub.   No murmur heard. Pulmonary/Chest: Effort normal and breath sounds normal. No respiratory distress. He has no wheezes. He has no rales. He exhibits no tenderness.  Abdominal: Soft. Bowel sounds are normal. He exhibits no distension. There is generalized tenderness. There is guarding. There is no rigidity, no rebound, no CVA tenderness, no tenderness at McBurney's point and negative Murphy's sign.  Musculoskeletal: Normal range of motion. He exhibits no tenderness.  Lymphadenopathy:    He has no cervical adenopathy.  Neurological: He is alert and oriented to person, place, and time.  Skin: Skin is warm and dry. Capillary refill takes less than 2 seconds. No rash noted.  Psychiatric: His behavior is normal. Judgment and  thought content normal.  Nursing note and vitals reviewed.    ED Treatments / Results  Labs (all labs ordered are listed, but only abnormal results are displayed) Labs Reviewed  COMPREHENSIVE METABOLIC PANEL - Abnormal; Notable for the following:       Result Value   CO2 21 (*)    Glucose, Bld 125 (*)    ALT 68 (*)    Total Bilirubin 2.6 (*)    All other components within normal limits  CBC - Abnormal; Notable for the following:    WBC 16.2 (*)    Hemoglobin 17.6 (*)    MCHC 36.2 (*)    All other components within normal limits  URINALYSIS, ROUTINE W REFLEX MICROSCOPIC - Abnormal; Notable for the following:    Ketones, ur 5 (*)    All other components within normal limits  LACTIC ACID, PLASMA - Abnormal; Notable for the following:    Lactic Acid, Venous 4.3 (*)    All other components within normal limits  LACTIC ACID, PLASMA - Abnormal; Notable for the following:    Lactic Acid, Venous 2.5 (*)    All other components within normal limits  CBC WITH DIFFERENTIAL/PLATELET - Abnormal; Notable for the following:    WBC 14.6 (*)  Platelets 147 (*)    Neutro Abs 12.3 (*)    All other components within normal limits  COMPREHENSIVE METABOLIC PANEL - Abnormal; Notable for the following:    Glucose, Bld 109 (*)    Calcium 7.8 (*)    Total Protein 5.7 (*)    Albumin 3.4 (*)    Total Bilirubin 2.4 (*)    All other components within normal limits  I-STAT CG4 LACTIC ACID, ED - Abnormal; Notable for the following:    Lactic Acid, Venous 4.04 (*)    All other components within normal limits  CULTURE, BLOOD (ROUTINE X 2)  CULTURE, BLOOD (ROUTINE X 2)  LIPASE, BLOOD  PROCALCITONIN  PROTIME-INR  APTT  LACTIC ACID, PLASMA  LACTIC ACID, PLASMA  LACTIC ACID, PLASMA    EKG  EKG Interpretation  Date/Time:  Monday April 06 2017 02:26:29 EDT Ventricular Rate:  125 PR Interval:    QRS Duration: 95 QT Interval:  332 QTC Calculation: 479 R Axis:   83 Text Interpretation:   Sinus tachycardia Borderline T abnormalities, inferior leads Borderline prolonged QT interval Confirmed by Palumbo, April ([id number]) on 04/06/2017 2:29:31 AM       Radiology Ct Abdomen Pelvis W Contrast  Result Date: 04/06/2017 CLINICAL DATA:  Abdominal pain with nausea and vomiting history of small bowel or section and perforation EXAM: CT ABDOMEN AND PELVIS WITH CONTRAST TECHNIQUE: Multidetector CT imaging of the abdomen and pelvis was performed using the standard protocol following bolus administration of intravenous contrast. CONTRAST:  100 mL Isovue-300 intravenous COMPARISON:  12/05/2016, 10/13/2016, 08/20/2016 FINDINGS: Lower chest: Lung bases demonstrate patchy dependent atelectasis. Normal heart size. Hepatobiliary: Hepatic steatosis. Stable hyperenhancing focus in the dome of the liver. No calcified gallstones. No biliary dilatation Pancreas: Unremarkable. No pancreatic ductal dilatation or surrounding inflammatory changes. Spleen: Normal in size without focal abnormality. Adrenals/Urinary Tract: Adrenal glands are unremarkable. Kidneys are normal, without renal calculi, focal lesion, or hydronephrosis. Bladder is unremarkable. Stomach/Bowel: The stomach is nonenlarged. Focally dilated segment of fluid-filled small bowel within the central pelvis. Postsurgical changes in the right lower quadrant. Mild wall thickening of the transverse colon. Sigmoid colon diverticula without acute inflammation. Vascular/Lymphatic: No significant vascular findings are present. No enlarged abdominal or pelvic lymph nodes. Reproductive: Prostate is unremarkable. Other: Negative for free air or free fluid. Moderate infiltration and inflammation within the anterior pelvic mesentery. Inflammatory process within the right lower quadrant/ upper pelvis, poorly defined, appears to be centered around non dilated small bowel loops. Edema and inflammation within the central mesentery. Musculoskeletal: No acute or suspicious bone  lesion. Trace retrolisthesis of L5 on S1 IMPRESSION: 1. Moderate inflammatory changes within the abdomen and upper pelvis with poorly defined inflammatory process in the right lower quadrant/upper pelvis. This appears to be centered around non dilated loops of small bowel, suggesting small bowel inflammation, possibly due to inflammatory bowel disease. Dilated segment of small bowel within the upper pelvis could relate to localized ileus. Mild diffuse generalized inflammation within the peritoneal cavity/central mesentery. No free air at this time. No organized abscess. There are mild changes of the transverse colon which could be due to mild colitis. 2. Sigmoid colon diverticular disease. 3. Hepatic steatosis. Stable small hyperenhancing focus in the dome of the liver, possibly a flash hemangioma. Electronically Signed   By: Jasmine Pang M.D.   On: 04/06/2017 01:21    Procedures Procedures (including critical care time)  Medications Ordered in ED Medications  fentaNYL (SUBLIMAZE) injection 25 mcg (25 mcg Intravenous Given  04/06/17 0457)  acetaminophen (TYLENOL) tablet 650 mg (not administered)    Or  acetaminophen (TYLENOL) suppository 650 mg (not administered)  ondansetron (ZOFRAN) tablet 4 mg ( Oral See Alternative 04/06/17 0457)    Or  ondansetron (ZOFRAN) injection 4 mg (4 mg Intravenous Given 04/06/17 0457)  0.9 %  sodium chloride infusion ( Intravenous New Bag/Given 04/06/17 0454)  levofloxacin (LEVAQUIN) IVPB 750 mg (not administered)  aztreonam (AZACTAM) 2 g in dextrose 5 % 50 mL IVPB (0 g Intravenous Stopped 04/06/17 0642)  vancomycin (VANCOCIN) IVPB 1000 mg/200 mL premix (1,000 mg Intravenous New Bag/Given 04/06/17 0614)  acetaminophen (TYLENOL) tablet 650 mg (650 mg Oral Given 04/05/17 2109)  sodium chloride 0.9 % bolus 1,000 mL (0 mLs Intravenous Stopped 04/06/17 0042)    And  sodium chloride 0.9 % bolus 1,000 mL (0 mLs Intravenous Stopped 04/06/17 0118)    And  sodium chloride 0.9 %  bolus 1,000 mL (0 mLs Intravenous Stopped 04/06/17 0249)    And  sodium chloride 0.9 % bolus 500 mL (0 mLs Intravenous Stopped 04/06/17 0209)  fentaNYL (SUBLIMAZE) injection 50 mcg (50 mcg Intravenous Given 04/06/17 0029)  vancomycin (VANCOCIN) IVPB 1000 mg/200 mL premix (0 mg Intravenous Stopped 04/06/17 0208)  aztreonam (AZACTAM) 2 g in dextrose 5 % 50 mL IVPB (0 g Intravenous Stopped 04/06/17 0208)  levofloxacin (LEVAQUIN) IVPB 750 mg (0 mg Intravenous Stopped 04/06/17 0208)  iopamidol (ISOVUE-300) 61 % injection 100 mL (100 mLs Intravenous Contrast Given 04/06/17 0043)  diphenhydrAMINE (BENADRYL) injection 25 mg (25 mg Intravenous Given 04/06/17 0042)  promethazine (PHENERGAN) injection 12.5 mg (12.5 mg Intravenous Given 04/06/17 0246)  fentaNYL (SUBLIMAZE) injection 25 mcg (25 mcg Intravenous Given 04/06/17 0248)  sodium chloride 0.9 % bolus 1,000 mL (1,000 mLs Intravenous New Bag/Given 04/06/17 0427)     Initial Impression / Assessment and Plan / ED Course  I have reviewed the triage vital signs and the nursing notes.  Pertinent labs & imaging results that were available during my care of the patient were reviewed by me and considered in my medical decision making (see chart for details).     Patient presents to the ED with complaints of generalized abdominal pain with associated nausea and emesis. Patient with history of small bowel perforation and abscess that has required surgical intervention and partial small bowel resection.  Patient is a toxic appearing. Patient is tachycardic. Febrile 101 in triage. Patient appears to be in significant amount of abdominal pain. Patient does have voluntary guarding and generalized abdominal pain to palpation.  Patient had lab work obtained during triage. Of note patient has a leukocytosis of 16,000.  Given patient's abdominal pain, tachycardia, fever I initiated a sepsis protocol and patient. 30 mL per KG fluid bolus was initiated. Blood cultures were  obtained prior to IV antibiotic.  Patient did have a lactic acidosis of 4. Patient is normal. Creatinine is normal. UA shows no signs of infection.  Patient has no hypotension at this time. Doubt septic shock. CT abdomen was obtained that shows small bowel inflammation concerning for IBD versus colitis versus ileus no focal abscess noted.  Patient was discussed with hospital medicine Dr. Toniann Fail who agrees to admission and will come to the ED to evaluate patient. Patient epidural plan of care. He currently remains hemodynamically stable at this time. Sepsis likely secondary to intra-abdominal source.  Patient seen by my attending Dr. Nicanor Alcon was agreeable to the above plan.  CRITICAL CARE Performed by: Demetrios Loll   Total critical  care time: 30 minutes  Critical care time was exclusive of separately billable procedures and treating other patients.  Sepsis secondary to intra abdominal source.   Critical care was necessary to treat or prevent imminent or life-threatening deterioration.  Critical care was time spent personally by me on the following activities: development of treatment plan with patient and/or surrogate as well as nursing, discussions with consultants, evaluation of patient's response to treatment, examination of patient, obtaining history from patient or surrogate, ordering and performing treatments and interventions, ordering and review of laboratory studies, ordering and review of radiographic studies, pulse oximetry and re-evaluation of patient's condition.      Final Clinical Impressions(s) / ED Diagnoses   Final diagnoses:  Sepsis, due to unspecified organism [facility])    New Prescriptions Current Discharge Medication List       Wallace Keller 04/06/17 0759    Palumbo, April, MD 04/06/17 [phone]

## 2017-04-06 NOTE — Consult Note (Signed)
Reason for Consult:  Abdominal pain Referring Physician:  Gala Lewandowsky MD CC:  Nausea and vomiting  With abdominal pain over the last 24 hours   [name] is an 36 y.o. male.  HPI: Pt know to our service who presented 08/20/16 with SBO/ileus, perforation, and abscess.  He had a drain placed by IR on 08/21/16, with 35 ml of purulent fluid, a second drain was placed on 08/23/16, and got 30 cc of purulent fluid from the Right lower abdomen.  He was discharge home on 08/27/16 with 10 more days of antibiotics and follow up in the office and IR clinic.  He was readmitted on 10/12/16, with a recurrent pelvic abscess, and hx of bowel fistula per CT.  A new drain was placed on 10/13/16 by IR.  He was taken to the OR on 2/29/18 by Dr. Barry Dienes and underwent a Diagnostic laparoscopy, drainage of abdominal abscess and open ileocecectomy.  Diet was advanced and he went home on 10/20/16.  He has been followed in the office and last visit to Dr. Barry Dienes was 02/23/17  He had another bout of possible diverticulitis in May, that was treated with Antibiotics and resolved.  He says no one has ever confirmed he had diverticultitis.  He was to be scheduled for flex Sigmoidoscopy.  Now presents with the above noted sx.  Work up in  The ED shows a low grade fever and tachycardia.  BP also up on admit.  BP is stable.  Labs show an elevated WBC 16.2.  H/H 17.6/48.6. CMP was unremarkable except for a bilirubin of 2.6.. Lactate was 4.04.  CT scan shows Moderate inflammatory changes within the abdomen and upper pelvis with poorly defined inflammatory process in the right lower quadrant/upper pelvis. This appears to be centered around non dilated loops of small bowel, suggesting small bowel inflammation, possibly due to inflammatory bowel disease. Dilated segment of small bowel within the upper pelvis could relate to localized ileus. Mild diffuse generalized inflammation within the peritoneal cavity/central mesentery. No free air at this time. No  organized abscess. There are mild changes of the transverse colon which could be due to mild colitis.  We are ask to see.  He was started on antibiotics at this point.  His lactate is improving Dehydration is better and WBC is improving.     Past Medical History:  Diagnosis Date  . 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 HERNIA REPAIR    . IR GENERIC HISTORICAL  09/09/2016   IR RADIOLOGIST EVAL & MGMT 09/09/2016 Greggory Keen, MD [facility] INTERV RAD  . IR RADIOLOGIST EVAL & MGMT  09/23/2016  . KNEE SURGERY Left   . LAPAROSCOPIC SMALL BOWEL RESECTION N/A 10/16/2016   Procedure: LAPAROSCOPIC SMALL BOWEL RESECTION POSSIBLE EXPLORATORY LAPAROTOMY AND POSSIBLE OPEN [facility] OUT ileocectomy;  Surgeon: Stark Klein, MD;  Location: [facility];  Service: General;  Laterality: N/A;    Family History  Problem Relation Age of Onset  . Diabetes Father   . Cancer Paternal Grandfather        oral cancer  . Mental illness Mother     Social History:  reports that he has never smoked. He has never used smokeless tobacco. He reports that he does not drink alcohol or use drugs.  Allergies:  Allergies  Allergen Reactions  . Keflex [Cephalexin] Anaphylaxis and Rash  . Penicillins Other (See Comments)  Childhood Has patient had a PCN reaction causing immediate rash, facial/tongue/throat swelling, SOB or lightheadedness with hypotension: uknown Has patient had a PCN reaction causing severe rash involving mucus membranes or skin necrosis: unknown Has patient had a PCN reaction that required hospitalization unknown Has patient had a PCN reaction occurring within the last 10 years: uknown If all of the above answers are "NO", then may proceed with Cephalosporin use.   . Sulfa Antibiotics Other (See Comments)    Unknown. Childhood Allergy    Medications:  Prior to Admission:  Prescriptions Prior to Admission   Medication Sig Dispense Refill Last Dose  . acetaminophen (TYLENOL) 325 MG tablet Take 2 tablets (650 mg total) by mouth every 6 (six) hours as needed for mild pain or headache.   04/05/2017 at Unknown time  . loratadine (CLARITIN) 10 MG tablet Take 10 mg by mouth daily.   Past Week at Unknown time  . saccharomyces boulardii (FLORASTOR) 250 MG capsule Take 1 capsule (250 mg total) by mouth 2 (two) times daily. 60 capsule 0 04/04/2017 at Unknown time  . oxyCODONE-acetaminophen (PERCOCET/ROXICET) 5-325 MG tablet Take 1-2 tablets by mouth every 6 (six) hours as needed for moderate pain. (Patient not taking: Reported on 04/05/2017) 30 tablet 0 Completed Course at Unknown time   Continuous: . sodium chloride 100 mL/hr at 04/06/17 0959  . levofloxacin (LEVAQUIN) IV    . metronidazole     Anti-infectives    Start     Dose/Rate Route Frequency Ordered Stop   04/06/17 2200  levofloxacin (LEVAQUIN) IVPB 750 mg     750 mg 100 mL/hr over 90 Minutes Intravenous Every 24 hours 04/06/17 0509     04/06/17 1030  metroNIDAZOLE (FLAGYL) IVPB 500 mg     500 mg 100 mL/hr over 60 Minutes Intravenous Every 8 hours 04/06/17 1006     04/06/17 0600  aztreonam (AZACTAM) 2 g in dextrose 5 % 50 mL IVPB  Status:  Discontinued     2 g 100 mL/hr over 30 Minutes Intravenous Every 8 hours 04/06/17 0509 04/06/17 1003   04/06/17 0600  vancomycin (VANCOCIN) IVPB 1000 mg/200 mL premix  Status:  Discontinued     1,000 mg 200 mL/hr over 60 Minutes Intravenous Every 8 hours 04/06/17 0509 04/06/17 1004   04/06/17 0030  vancomycin (VANCOCIN) IVPB 1000 mg/200 mL premix     1,000 mg 200 mL/hr over 60 Minutes Intravenous  Once 04/06/17 0019 04/06/17 0208   04/06/17 0030  aztreonam (AZACTAM) 2 g in dextrose 5 % 50 mL IVPB     2 g 100 mL/hr over 30 Minutes Intravenous  Once 04/06/17 0019 04/06/17 0208   04/06/17 0030  levofloxacin (LEVAQUIN) IVPB 750 mg     750 mg 100 mL/hr over 90 Minutes Intravenous  Once 04/06/17 0019 04/06/17  0208      Results for orders placed or performed during the hospital encounter of 04/05/17 (from the past 48 hour(s))  Urinalysis, Routine w reflex microscopic     Status: Abnormal   Collection Time: 04/05/17  9:05 PM  Result Value Ref Range   Color, Urine YELLOW YELLOW   APPearance CLEAR CLEAR   Specific Gravity, Urine 1.028 1.005 - 1.030   pH 5.0 5.0 - 8.0   Glucose, UA NEGATIVE NEGATIVE mg/dL   Hgb urine dipstick NEGATIVE NEGATIVE   Bilirubin Urine NEGATIVE NEGATIVE   Ketones, ur 5 (A) NEGATIVE mg/dL   Protein, ur NEGATIVE NEGATIVE mg/dL   Nitrite NEGATIVE NEGATIVE  Leukocytes, UA NEGATIVE NEGATIVE  Lipase, blood     Status: None   Collection Time: 04/05/17  9:25 PM  Result Value Ref Range   Lipase 28 11 - 51 U/L  Comprehensive metabolic panel     Status: Abnormal   Collection Time: 04/05/17  9:25 PM  Result Value Ref Range   Sodium 137 135 - 145 mmol/L   Potassium 3.7 3.5 - 5.1 mmol/L   Chloride 105 101 - 111 mmol/L   CO2 21 (L) 22 - 32 mmol/L   Glucose, Bld 125 (H) 65 - 99 mg/dL   BUN 20 6 - 20 mg/dL   Creatinine, Ser 0.99 0.61 - 1.24 mg/dL   Calcium 9.8 8.9 - 10.3 mg/dL   Total Protein 7.7 6.5 - 8.1 g/dL   Albumin 5.0 3.5 - 5.0 g/dL   AST 34 15 - 41 U/L   ALT 68 (H) 17 - 63 U/L   Alkaline Phosphatase 78 38 - 126 U/L   Total Bilirubin 2.6 (H) 0.3 - 1.2 mg/dL   GFR calc non Af Amer >60 >60 mL/min   GFR calc Af Amer >60 >60 mL/min    Comment: (NOTE) The eGFR has been calculated using the CKD EPI equation. This calculation has not been validated in all clinical situations. eGFR's persistently <60 mL/min signify possible Chronic Kidney Disease.    Anion gap 11 5 - 15  CBC     Status: Abnormal   Collection Time: 04/05/17  9:25 PM  Result Value Ref Range   WBC 16.2 (H) 4.0 - 10.5 K/uL   RBC 5.50 4.22 - 5.81 MIL/uL   Hemoglobin 17.6 (H) 13.0 - 17.0 g/dL   HCT 48.6 39.0 - 52.0 %   MCV 88.4 78.0 - 100.0 fL   MCH 32.0 26.0 - 34.0 pg   MCHC 36.2 (H) 30.0 - 36.0  g/dL   RDW 12.5 11.5 - 15.5 %   Platelets 196 150 - 400 K/uL  I-Stat CG4 Lactic Acid, ED     Status: Abnormal   Collection Time: 04/06/17 12:15 AM  Result Value Ref Range   Lactic Acid, Venous 4.04 (HH) 0.5 - 1.9 mmol/L   Comment NOTIFIED PHYSICIAN   Lactic acid, plasma     Status: Abnormal   Collection Time: 04/06/17  1:15 AM  Result Value Ref Range   Lactic Acid, Venous 4.3 (HH) 0.5 - 1.9 mmol/L    Comment: CRITICAL RESULT CALLED TO, READ BACK BY AND VERIFIED WITH: MJIHI,O RN [phone] COVINGTON,N   Lactic acid, plasma     Status: Abnormal   Collection Time: 04/06/17  4:28 AM  Result Value Ref Range   Lactic Acid, Venous 2.5 (HH) 0.5 - 1.9 mmol/L    Comment: CRITICAL RESULT CALLED TO, READ BACK BY AND VERIFIED WITH: M OBRYANT,RN '@[id number]'  04/06/17 MKELLY   CBC with Differential     Status: Abnormal   Collection Time: 04/06/17  4:28 AM  Result Value Ref Range   WBC 14.6 (H) 4.0 - 10.5 K/uL   RBC 4.58 4.22 - 5.81 MIL/uL   Hemoglobin 14.2 13.0 - 17.0 g/dL   HCT 39.9 39.0 - 52.0 %   MCV 87.1 78.0 - 100.0 fL   MCH 31.0 26.0 - 34.0 pg   MCHC 35.6 30.0 - 36.0 g/dL   RDW 12.9 11.5 - 15.5 %   Platelets 147 (L) 150 - 400 K/uL   Neutrophils Relative % 84 %   Neutro Abs 12.3 (H) 1.7 - 7.7 K/uL  Lymphocytes Relative 9 %   Lymphs Abs 1.3 0.7 - 4.0 K/uL   Monocytes Relative 7 %   Monocytes Absolute 1.0 0.1 - 1.0 K/uL   Eosinophils Relative 0 %   Eosinophils Absolute 0.0 0.0 - 0.7 K/uL   Basophils Relative 0 %   Basophils Absolute 0.0 0.0 - 0.1 K/uL  Comprehensive metabolic panel     Status: Abnormal   Collection Time: 04/06/17  4:28 AM  Result Value Ref Range   Sodium 137 135 - 145 mmol/L   Potassium 3.8 3.5 - 5.1 mmol/L   Chloride 107 101 - 111 mmol/L   CO2 22 22 - 32 mmol/L   Glucose, Bld 109 (H) 65 - 99 mg/dL   BUN 17 6 - 20 mg/dL   Creatinine, Ser 0.89 0.61 - 1.24 mg/dL   Calcium 7.8 (L) 8.9 - 10.3 mg/dL   Total Protein 5.7 (L) 6.5 - 8.1 g/dL   Albumin 3.4 (L) 3.5 - 5.0  g/dL   AST 23 15 - 41 U/L   ALT 46 17 - 63 U/L   Alkaline Phosphatase 48 38 - 126 U/L   Total Bilirubin 2.4 (H) 0.3 - 1.2 mg/dL   GFR calc non Af Amer >60 >60 mL/min   GFR calc Af Amer >60 >60 mL/min    Comment: (NOTE) The eGFR has been calculated using the CKD EPI equation. This calculation has not been validated in all clinical situations. eGFR's persistently <60 mL/min signify possible Chronic Kidney Disease.    Anion gap 8 5 - 15  Procalcitonin     Status: None   Collection Time: 04/06/17  4:28 AM  Result Value Ref Range   Procalcitonin 0.57 ng/mL    Comment:        Interpretation: PCT > 0.5 ng/mL and <= 2 ng/mL: Systemic infection (sepsis) is possible, but other conditions are known to elevate PCT as well. (NOTE)         ICU PCT Algorithm               Non ICU PCT Algorithm    ----------------------------     ------------------------------         PCT < 0.25 ng/mL                 PCT < 0.1 ng/mL     Stopping of antibiotics            Stopping of antibiotics       strongly encouraged.               strongly encouraged.    ----------------------------     ------------------------------       PCT level decrease by               PCT < 0.25 ng/mL       >= 80% from peak PCT       OR PCT 0.25 - 0.5 ng/mL          Stopping of antibiotics                                             encouraged.     Stopping of antibiotics           encouraged.    ----------------------------     ------------------------------       PCT level decrease by  PCT >= 0.25 ng/mL       < 80% from peak PCT        AND PCT >= 0.5 ng/mL             Continuing antibiotics                                              encouraged.       Continuing antibiotics            encouraged.    ----------------------------     ------------------------------     PCT level increase compared          PCT > 0.5 ng/mL         with peak PCT AND          PCT >= 0.5 ng/mL             Escalation of antibiotics                                           strongly encouraged.      Escalation of antibiotics        strongly encouraged.   Protime-INR     Status: None   Collection Time: 04/06/17  4:28 AM  Result Value Ref Range   Prothrombin Time 14.6 11.4 - 15.2 seconds   INR 1.15   APTT     Status: None   Collection Time: 04/06/17  4:28 AM  Result Value Ref Range   aPTT 28 24 - 36 seconds  Lactic acid, plasma     Status: None   Collection Time: 04/06/17  8:00 AM  Result Value Ref Range   Lactic Acid, Venous 1.8 0.5 - 1.9 mmol/L  Lactic acid, plasma     Status: None   Collection Time: 04/06/17  8:00 AM  Result Value Ref Range   Lactic Acid, Venous 1.8 0.5 - 1.9 mmol/L    Ct Abdomen Pelvis W Contrast  Result Date: 04/06/2017 CLINICAL DATA:  Abdominal pain with nausea and vomiting history of small bowel or section and perforation EXAM: CT ABDOMEN AND PELVIS WITH CONTRAST TECHNIQUE: Multidetector CT imaging of the abdomen and pelvis was performed using the standard protocol following bolus administration of intravenous contrast. CONTRAST:  100 mL Isovue-300 intravenous COMPARISON:  12/05/2016, 10/13/2016, 08/20/2016 FINDINGS: Lower chest: Lung bases demonstrate patchy dependent atelectasis. Normal heart size. Hepatobiliary: Hepatic steatosis. Stable hyperenhancing focus in the dome of the liver. No calcified gallstones. No biliary dilatation Pancreas: Unremarkable. No pancreatic ductal dilatation or surrounding inflammatory changes. Spleen: Normal in size without focal abnormality. Adrenals/Urinary Tract: Adrenal glands are unremarkable. Kidneys are normal, without renal calculi, focal lesion, or hydronephrosis. Bladder is unremarkable. Stomach/Bowel: The stomach is nonenlarged. Focally dilated segment of fluid-filled small bowel within the central pelvis. Postsurgical changes in the right lower quadrant. Mild wall thickening of the transverse colon. Sigmoid colon diverticula without acute inflammation.  Vascular/Lymphatic: No significant vascular findings are present. No enlarged abdominal or pelvic lymph nodes. Reproductive: Prostate is unremarkable. Other: Negative for free air or free fluid. Moderate infiltration and inflammation within the anterior pelvic mesentery. Inflammatory process within the right lower quadrant/ upper pelvis, poorly defined, appears to be centered around non dilated small bowel loops. Edema and inflammation within  the central mesentery. Musculoskeletal: No acute or suspicious bone lesion. Trace retrolisthesis of L5 on S1 IMPRESSION: 1. Moderate inflammatory changes within the abdomen and upper pelvis with poorly defined inflammatory process in the right lower quadrant/upper pelvis. This appears to be centered around non dilated loops of small bowel, suggesting small bowel inflammation, possibly due to inflammatory bowel disease. Dilated segment of small bowel within the upper pelvis could relate to localized ileus. Mild diffuse generalized inflammation within the peritoneal cavity/central mesentery. No free air at this time. No organized abscess. There are mild changes of the transverse colon which could be due to mild colitis. 2. Sigmoid colon diverticular disease. 3. Hepatic steatosis. Stable small hyperenhancing focus in the dome of the liver, possibly a flash hemangioma. Electronically Signed   By: Donavan Foil M.D.   On: 04/06/2017 01:21    Review of Systems  Constitutional: Positive for fever.  HENT: Negative.   Eyes: Negative.   Respiratory: Negative.   Cardiovascular: Negative.   Gastrointestinal: Positive for abdominal pain, diarrhea (some diarrhea post op but is better over the last month), nausea and vomiting. Negative for blood in stool, constipation and melena.  Genitourinary: Negative.   Musculoskeletal: Positive for joint pain (patella alta). Negative for back pain, falls, myalgias and neck pain.  Skin: Itching: started having sweats and low grad fever at home   99 range.  Neurological: Negative.   Endo/Heme/Allergies: Negative.   Psychiatric/Behavioral: Negative.        Upset he is gaining weight back.   Blood pressure 112/70, pulse (!) 108, temperature 98.4 F (36.9 C), temperature source Oral, resp. rate 16, height '6\' 2"'  (1.88 m), weight 114 kg (251 lb 5.2 oz), SpO2 98 %. Physical Exam  Constitutional: He is oriented to person, place, and time. He appears well-developed and well-nourished. No distress.  Body mass index is 32.27 kg/m. He is still having pain RLQ   HENT:  Head: Normocephalic and atraumatic.  Mouth/Throat: No oropharyngeal exudate.  Eyes: Right eye exhibits no discharge. Left eye exhibits no discharge. No scleral icterus.  Pupils are equal  Neck: Normal range of motion. No JVD present. No tracheal deviation present. No thyromegaly present.  Cardiovascular: Normal rate, normal heart sounds and intact distal pulses.   No murmur heard. Respiratory: Effort normal and breath sounds normal. No respiratory distress. He has no wheezes. He has no rales. He exhibits no tenderness.  GI: He exhibits no distension and no mass. There is tenderness (tenderness is most RLQ). There is no rebound and no guarding.  Musculoskeletal: He exhibits no edema or tenderness.  Lymphadenopathy:    He has no cervical adenopathy.  Neurological: He is alert and oriented to person, place, and time. No cranial nerve deficit.  Skin: Skin is warm. No rash noted. He is not diaphoretic. No erythema. No pallor.  Psychiatric: He has a normal mood and affect. His behavior is normal. Judgment and thought content normal.   . sodium chloride 100 mL/hr at 04/06/17 0959  . levofloxacin (LEVAQUIN) IV    . metronidazole     Anti-infectives    Start     Dose/Rate Route Frequency Ordered Stop   04/06/17 2200  levofloxacin (LEVAQUIN) IVPB 750 mg     750 mg 100 mL/hr over 90 Minutes Intravenous Every 24 hours 04/06/17 0509     04/06/17 1030  metroNIDAZOLE (FLAGYL)  IVPB 500 mg     500 mg 100 mL/hr over 60 Minutes Intravenous Every 8 hours 04/06/17 1006  04/06/17 0600  aztreonam (AZACTAM) 2 g in dextrose 5 % 50 mL IVPB  Status:  Discontinued     2 g 100 mL/hr over 30 Minutes Intravenous Every 8 hours 04/06/17 0509 04/06/17 1003   04/06/17 0600  vancomycin (VANCOCIN) IVPB 1000 mg/200 mL premix  Status:  Discontinued     1,000 mg 200 mL/hr over 60 Minutes Intravenous Every 8 hours 04/06/17 0509 04/06/17 1004   04/06/17 0030  vancomycin (VANCOCIN) IVPB 1000 mg/200 mL premix     1,000 mg 200 mL/hr over 60 Minutes Intravenous  Once 04/06/17 0019 04/06/17 0208   04/06/17 0030  aztreonam (AZACTAM) 2 g in dextrose 5 % 50 mL IVPB     2 g 100 mL/hr over 30 Minutes Intravenous  Once 04/06/17 0019 04/06/17 0208   04/06/17 0030  levofloxacin (LEVAQUIN) IVPB 750 mg     750 mg 100 mL/hr over 90 Minutes Intravenous  Once 04/06/17 0019 04/06/17 0208     Post op pathology 10/14/16 Small intestine, resection, terminal ileum and cecum - BENIGN SMALL BOWEL WITH SEROSAL ADHESIONS AND INFLAMMATION, SEE COMMENT. - BENIGN APPENDIX. - FOUR BENIGN LYMPH NODES. - NO DYSPLASIA OR MALIGNANCY.   Assessment/Plan: Abdominal pain, nausea and vomiting with new inflammatory changes RLQ and upper pelvis, possible colitis. 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 Barry Dienes Diverticulosis Hepatic steatosis Patella alta - limited mobility BMI 33 FEN:  NPO/IV fluids ID: Azactam 9/17 =>> day 1, Vancomycin 9/17 x 1, Levofloxacin 9/17 = day 1, Flagyl 9/17 = day 1 DVT: SCD =>> he can be on anticoagulation from our standpoint for DVT prophylaxis  Plan:  Agree with antibiotics, he has never been seen by GI and will need GI  follow up for diverticulosis and possible colitis.  It is unclear to me if this is large bowel diverticulitis or if there is also some small bowel involvement that has been mentioned but not really documented anywhere that I can see.  Dr. Kieth Brightly will and review.  We will follow with you   Kerri Asche 04/06/2017, 9:37 AM

## 2017-04-06 NOTE — ED Notes (Signed)
Pt from home with c/o upper right sided abdominal pain that began today. Pt has hx of small bowel perforation. Pt is febrile at time of assessment

## 2017-04-06 NOTE — ED Notes (Signed)
Pt's contact:  Mother-in-law------ tel# [phone]

## 2017-04-06 NOTE — Progress Notes (Signed)
A consult was received from an ED physician for vancomycin, aztreonam, levofloxacin per pharmacy dosing.  The patient's profile has been reviewed for ht/wt/allergies/indication/available labs.   A one time order has been placed for Vancomycin 1gm iv x1, aztreonam 2gm iv x1, levofloxacin  iv x1.  Further antibiotics/pharmacy consults should be ordered by admitting physician if indicated.                       Thank you,  Luetta Nutting PharmD, BCPS  04/06/2017, 12:21 AM

## 2017-04-07 DIAGNOSIS — K529 Noninfective gastroenteritis and colitis, unspecified: Secondary | ICD-10-CM

## 2017-04-07 LAB — CBC WITH DIFFERENTIAL/PLATELET
Basophils Absolute: 0 10*3/uL (ref 0.0–0.1)
Basophils Relative: 0 %
EOS ABS: 0 10*3/uL (ref 0.0–0.7)
Eosinophils Relative: 0 %
HEMATOCRIT: 39.1 % (ref 39.0–52.0)
HEMOGLOBIN: 13.6 g/dL (ref 13.0–17.0)
LYMPHS ABS: 1.1 10*3/uL (ref 0.7–4.0)
Lymphocytes Relative: 8 %
MCH: 31.4 pg (ref 26.0–34.0)
MCHC: 34.8 g/dL (ref 30.0–36.0)
MCV: 90.3 fL (ref 78.0–100.0)
MONO ABS: 0.9 10*3/uL (ref 0.1–1.0)
MONOS PCT: 8 %
NEUTROS PCT: 84 %
Neutro Abs: 10.4 10*3/uL — ABNORMAL HIGH (ref 1.7–7.7)
Platelets: 143 10*3/uL — ABNORMAL LOW (ref 150–400)
RBC: 4.33 MIL/uL (ref 4.22–5.81)
RDW: 13.1 % (ref 11.5–15.5)
WBC: 12.4 10*3/uL — ABNORMAL HIGH (ref 4.0–10.5)

## 2017-04-07 LAB — COMPREHENSIVE METABOLIC PANEL
ALK PHOS: 51 U/L (ref 38–126)
ALT: 34 U/L (ref 17–63)
ANION GAP: 8 (ref 5–15)
AST: 16 U/L (ref 15–41)
Albumin: 3.4 g/dL — ABNORMAL LOW (ref 3.5–5.0)
BILIRUBIN TOTAL: 4.5 mg/dL — AB (ref 0.3–1.2)
BUN: 11 mg/dL (ref 6–20)
CALCIUM: 8.3 mg/dL — AB (ref 8.9–10.3)
CO2: 22 mmol/L (ref 22–32)
CREATININE: 0.77 mg/dL (ref 0.61–1.24)
Chloride: 108 mmol/L (ref 101–111)
GFR calc non Af Amer: >60 mL/min (ref >60)
GLUCOSE: 101 mg/dL — AB (ref 65–99)
POTASSIUM: 3.3 mmol/L — AB (ref 3.5–5.1)
SODIUM: 138 mmol/L (ref 135–145)
Total Protein: 5.7 g/dL — ABNORMAL LOW (ref 6.5–8.1)

## 2017-04-07 LAB — GLUCOSE, CAPILLARY
Glucose-Capillary: 99 mg/dL (ref 65–99)
Glucose-Capillary: 113 mg/dL — ABNORMAL HIGH (ref 65–99)

## 2017-04-07 MED ORDER — KETOROLAC TROMETHAMINE 15 MG/ML IJ SOLN
15.0000 mg | Freq: Four times a day (QID) | INTRAMUSCULAR | Status: AC | PRN
  Administered 2017-04-07 – 2017-04-12 (×9): 15 mg via INTRAVENOUS
  Filled 2017-04-07 (×10): qty 1

## 2017-04-07 MED ORDER — SODIUM CHLORIDE 0.9 % IV SOLN
INTRAVENOUS | Status: DC
  Administered 2017-04-07 – 2017-04-08 (×3): via INTRAVENOUS

## 2017-04-07 MED ORDER — TRAMADOL HCL 50 MG PO TABS
50.0000 mg | ORAL_TABLET | Freq: Four times a day (QID) | ORAL | Status: DC | PRN
  Administered 2017-04-07: 50 mg via ORAL
  Filled 2017-04-07 (×3): qty 1

## 2017-04-07 MED ORDER — PROMETHAZINE HCL 25 MG/ML IJ SOLN
12.5000 mg | Freq: Once | INTRAMUSCULAR | Status: AC
  Administered 2017-04-07: 12.5 mg via INTRAVENOUS
  Filled 2017-04-07: qty 1

## 2017-04-07 MED ORDER — POTASSIUM CHLORIDE IN NACL 40-0.9 MEQ/L-% IV SOLN
INTRAVENOUS | Status: AC
  Administered 2017-04-07: 100 mL/h via INTRAVENOUS
  Filled 2017-04-07: qty 1000

## 2017-04-07 MED ORDER — ENOXAPARIN SODIUM 40 MG/0.4ML ~~LOC~~ SOLN
40.0000 mg | SUBCUTANEOUS | Status: DC
  Administered 2017-04-07 – 2017-04-15 (×8): 40 mg via SUBCUTANEOUS
  Filled 2017-04-07 (×9): qty 0.4

## 2017-04-07 MED ORDER — SODIUM CHLORIDE 0.9 % IV SOLN: INTRAVENOUS | Status: DC

## 2017-04-07 NOTE — Progress Notes (Signed)
Pt continues to complain of ABD pain and discomfort. MD called and order noted. SRP, RN

## 2017-04-07 NOTE — Progress Notes (Signed)
CC:  Abdominal pain  Subjective: He is still having pain RLQ and very anxious, worried  About having another abscess, and asking about doing another CT.  He had 4 BM's yesterday that ranged from normal to slightly soft/loose.   Objective: Vital signs in last 24 hours: Temp:  [98.5 F (36.9 C)-99.2 F (37.3 C)] 98.5 F (36.9 C) (09/18 0523) Pulse Rate:  [96-105] 102 (09/18 0523) Resp:  [18] 18 (09/18 0523) BP: (118-121)/(72-78) 118/72 (09/18 0523) SpO2:  [96 %-98 %] 96 % (09/18 0523) Last BM Date: 04/06/17 NPO 3049 IV 1825 urine BM x 4 Tm 99.2 yesterday HR still up some Labs show bilirubin going up, other LFT's OK WBC improving, platelets are down some.   CT was yesterday at 1 AM Intake/Output from previous day: 09/17 0701 - 09/18 0700 In: 3049.2 [I.V.:2599.2; IV Piggyback:450] Out: 1825 [Urine:1825] Intake/Output this shift: No intake/output data recorded.  General appearance: alert, cooperative and no distress Resp: clear to auscultation bilaterally GI: soft, tender RLQ and wants more for pain now.  + BS, + BM  Lab Results:   Recent Labs  04/06/17 0428 04/07/17 0444  WBC 14.6* 12.4*  HGB 14.2 13.6  HCT 39.9 39.1  PLT 147* 143*    BMET  Recent Labs  04/06/17 0428 04/07/17 0444  NA 137 138  K 3.8 3.3*  CL 107 108  CO2 22 22  GLUCOSE 109* 101*  BUN 17 11  CREATININE 0.89 0.77  CALCIUM 7.8* 8.3*   PT/INR  Recent Labs  04/06/17 0428  LABPROT 14.6  INR 1.15     Recent Labs Lab 04/05/17 2125 04/06/17 0428 04/07/17 0444  AST 34 23 16  ALT 68* 46 34  ALKPHOS 78 48 51  BILITOT 2.6* 2.4* 4.5*  PROT 7.7 5.7* 5.7*  ALBUMIN 5.0 3.4* 3.4*     Lipase     Component Value Date/Time   LIPASE 28 04/05/2017 2125     Medications:   . sodium chloride 100 mL/hr at 04/07/17 0221  . levofloxacin (LEVAQUIN) IV Stopped (04/06/17 2308)  . metronidazole Stopped (04/07/17 0429)   Anti-infectives    Start     Dose/Rate Route Frequency  Ordered Stop   04/06/17 2200  levofloxacin (LEVAQUIN) IVPB 750 mg     750 mg 100 mL/hr over 90 Minutes Intravenous Every 24 hours 04/06/17 0509     04/06/17 1030  metroNIDAZOLE (FLAGYL) IVPB 500 mg     500 mg 100 mL/hr over 60 Minutes Intravenous Every 8 hours 04/06/17 1006     04/06/17 0600  aztreonam (AZACTAM) 2 g in dextrose 5 % 50 mL IVPB  Status:  Discontinued     2 g 100 mL/hr over 30 Minutes Intravenous Every 8 hours 04/06/17 0509 04/06/17 1003   04/06/17 0600  vancomycin (VANCOCIN) IVPB 1000 mg/200 mL premix  Status:  Discontinued     1,000 mg 200 mL/hr over 60 Minutes Intravenous Every 8 hours 04/06/17 0509 04/06/17 1004   04/06/17 0030  vancomycin (VANCOCIN) IVPB 1000 mg/200 mL premix     1,000 mg 200 mL/hr over 60 Minutes Intravenous  Once 04/06/17 0019 04/06/17 0208   04/06/17 0030  aztreonam (AZACTAM) 2 g in dextrose 5 % 50 mL IVPB     2 g 100 mL/hr over 30 Minutes Intravenous  Once 04/06/17 0019 04/06/17 0208   04/06/17 0030  levofloxacin (LEVAQUIN) IVPB 750 mg     750 mg 100 mL/hr over 90 Minutes Intravenous  Once  04/06/17 0019 04/06/17 0208      Assessment/Plan Abdominal pain, nausea and vomiting with new inflammatory changes RLQ and upper pelvis, possible colitis. 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 Donell Beers Diverticulosis Hepatic steatosis Patella alta - limited mobility BMI 33 FEN:  NPO/IV fluids ID: Azactam 9/17 =>> day 1, Vancomycin 9/17 x 1, Levofloxacin 9/17 = day 2, Flagyl 9/17 = day 2 DVT: SCD =>> he can be on anticoagulation from our standpoint for DVT prophylaxis     Plan:  Some ice chips and sips.  I'm not sure why his Bilirubin is up.  WBC is better.  Continue antibiotics.  LOS: 1 day     Carlei Huang 04/07/2017 [phone]

## 2017-04-07 NOTE — Progress Notes (Addendum)
PROGRESS NOTE    [name]  WUJ:[id number] DOB: April 02, 1981 DOA: 04/05/2017 PCP: Donita Brooks, MD     Brief Narrative:  [name] is a 36 yo male with history of recurrent pelvic abscess and perforated viscus s/p ileocecectomy in early 2018. He presented to the ED with chief complaint of abdominal pain, nausea, vomiting without diarrhea. In the ER, CT of the abdomen and pelvis showed inflammation of the small bowel with possible ileus, colitis. No definite abscess. He was febrile, tachycardic with leukocytosis and lactic acidosis of 4.3. Patient was started on empiric antibiotics, admitted for further treatment.  Assessment & Plan:   Principal Problem:   Sepsis (HCC) Active Problems:   Colitis   Sepsis secondary to colitis -Hx of recurrent pelvic abscess and perforated viscus s/p ileocecectomy in Feb 2018 -Blood culture pending  -General surgery following -Empiric levaquin/flagyl -NPO with sips of clears, ice   Hypokalemia -Replace, trend    DVT prophylaxis: lovenox Code Status: full Family Communication: no family at bedside, spoke w mother in law over phone  Disposition Plan: pending improvement   Consultants:   CCS  Procedures:   None  Antimicrobials:  Anti-infectives    Start     Dose/Rate Route Frequency Ordered Stop   04/06/17 2200  levofloxacin (LEVAQUIN) IVPB 750 mg     750 mg 100 mL/hr over 90 Minutes Intravenous Every 24 hours 04/06/17 0509     04/06/17 1030  metroNIDAZOLE (FLAGYL) IVPB 500 mg     500 mg 100 mL/hr over 60 Minutes Intravenous Every 8 hours 04/06/17 1006     04/06/17 0600  aztreonam (AZACTAM) 2 g in dextrose 5 % 50 mL IVPB  Status:  Discontinued     2 g 100 mL/hr over 30 Minutes Intravenous Every 8 hours 04/06/17 0509 04/06/17 1003   04/06/17 0600  vancomycin (VANCOCIN) IVPB 1000 mg/200 mL premix  Status:  Discontinued     1,000 mg 200 mL/hr over 60 Minutes Intravenous Every 8 hours 04/06/17 0509 04/06/17 1004   04/06/17  0030  vancomycin (VANCOCIN) IVPB 1000 mg/200 mL premix     1,000 mg 200 mL/hr over 60 Minutes Intravenous  Once 04/06/17 0019 04/06/17 0208   04/06/17 0030  aztreonam (AZACTAM) 2 g in dextrose 5 % 50 mL IVPB     2 g 100 mL/hr over 30 Minutes Intravenous  Once 04/06/17 0019 04/06/17 0208   04/06/17 0030  levofloxacin (LEVAQUIN) IVPB 750 mg     750 mg 100 mL/hr over 90 Minutes Intravenous  Once 04/06/17 0019 04/06/17 0208       Subjective: Feeling better today. Abdominal pain is much better, no further nausea or vomiting. Has had loose bowel movements. Without any hematochezia or melena   Objective: Vitals:   04/06/17 0453 04/06/17 1411 04/06/17 2052 04/07/17 0523  BP: 112/70 121/73 119/78 118/72  Pulse: (!) 108 (!) 105 96 (!) 102  Resp: Temp: 98.4 F (36.9 C) 98.9 F (37.2 C) 99.2 F (37.3 C) 98.5 F (36.9 C)  TempSrc: Oral Oral Oral Oral  SpO2: 98% 98% 97% 96%  Weight:      Height:        Intake/Output Summary (Last 24 hours) at 04/07/17 1356 Last data filed at 04/07/17 1220  Gross per 24 hour  Intake             2590 ml  Output  875 ml  Net             1715 ml   Filed Weights   04/05/17 2033 04/06/17 0338  Weight: 106.6 kg (235 lb) 114 kg (251 lb 5.2 oz)    Examination:  General exam: Appears calm and comfortable  Respiratory system: Clear to auscultation. Respiratory effort normal. Cardiovascular system: S1 & S2 heard, tachycardic, regular. No JVD, murmurs, rubs, gallops or clicks. No pedal edema. Gastrointestinal system: Abdomen is nondistended, soft and TTP diffusely, worst in the RLQ. No organomegaly or masses felt. Normal bowel sounds heard. Central nervous system: Alert and oriented. No focal neurological deficits. Extremities: Symmetric 5 x 5 power. Skin: No rashes, lesions or ulcers Psychiatry: Judgement and insight appear normal. Mood & affect appropriate.   Data Reviewed: I have personally reviewed following labs and imaging  studies  CBC:  Recent Labs Lab 04/05/17 2125 04/06/17 0428 04/07/17 0444  WBC 16.2* 14.6* 12.4*  NEUTROABS  --  12.3* 10.4*  HGB 17.6* 14.2 13.6  HCT 48.6 39.9 39.1  MCV 88.4 87.1 90.3  PLT 196 147* 143*   Basic Metabolic Panel:  Recent Labs Lab 04/05/17 2125 04/06/17 0428 04/07/17 0444  NA 137 137 138  K 3.7 3.8 3.3*  CL 105 107 108  CO2 21* 22 22  GLUCOSE 125* 109* 101*  BUN CREATININE 0.99 0.89 0.77  CALCIUM 9.8 7.8* 8.3*   GFR: Estimated Creatinine Clearance: 171.3 mL/min (by C-G formula based on SCr of 0.77 mg/dL). Liver Function Tests:  Recent Labs Lab 04/05/17 2125 04/06/17 0428 04/07/17 0444  AST 34 23 16  ALT 68* 46 34  ALKPHOS 78 48 51  BILITOT 2.6* 2.4* 4.5*  PROT 7.7 5.7* 5.7*  ALBUMIN 5.0 3.4* 3.4*    Recent Labs Lab 04/05/17 2125  LIPASE 28   No results for input(s): AMMONIA in the last 168 hours. Coagulation Profile:  Recent Labs Lab 04/06/17 0428  INR 1.15   Cardiac Enzymes: No results for input(s): CKTOTAL, CKMB, CKMBINDEX, TROPONINI in the last 168 hours. BNP (last 3 results) No results for input(s): PROBNP in the last 8760 hours. HbA1C: No results for input(s): HGBA1C in the last 72 hours. CBG:  Recent Labs Lab 04/06/17 1037 04/06/17 1600 04/06/17 2323 04/07/17 0741  GLUCAP 94 100* 108* 99   Lipid Profile: No results for input(s): CHOL, HDL, LDLCALC, TRIG, CHOLHDL, LDLDIRECT in the last 72 hours. Thyroid Function Tests: No results for input(s): TSH, T4TOTAL, FREET4, T3FREE, THYROIDAB in the last 72 hours. Anemia Panel: No results for input(s): VITAMINB12, FOLATE, FERRITIN, TIBC, IRON, RETICCTPCT in the last 72 hours. Sepsis Labs:  Recent Labs Lab 04/06/17 0015 04/06/17 0115 04/06/17 0428 04/06/17 0800  PROCALCITON  --   --  0.57  --   LATICACIDVEN 4.04* 4.3* 2.5* 1.8  1.8    No results found for this or any previous visit (from the past 240 hour(s)).     Radiology Studies: Ct Abdomen  Pelvis W Contrast  Result Date: 04/06/2017 CLINICAL DATA:  Abdominal pain with nausea and vomiting history of small bowel or section and perforation EXAM: CT ABDOMEN AND PELVIS WITH CONTRAST TECHNIQUE: Multidetector CT imaging of the abdomen and pelvis was performed using the standard protocol following bolus administration of intravenous contrast. CONTRAST:  100 mL Isovue-300 intravenous COMPARISON:  12/05/2016, 10/13/2016, 08/20/2016 FINDINGS: Lower chest: Lung bases demonstrate patchy dependent atelectasis. Normal heart size. Hepatobiliary: Hepatic steatosis. Stable hyperenhancing focus in the dome of the liver.  No calcified gallstones. No biliary dilatation Pancreas: Unremarkable. No pancreatic ductal dilatation or surrounding inflammatory changes. Spleen: Normal in size without focal abnormality. Adrenals/Urinary Tract: Adrenal glands are unremarkable. Kidneys are normal, without renal calculi, focal lesion, or hydronephrosis. Bladder is unremarkable. Stomach/Bowel: The stomach is nonenlarged. Focally dilated segment of fluid-filled small bowel within the central pelvis. Postsurgical changes in the right lower quadrant. Mild wall thickening of the transverse colon. Sigmoid colon diverticula without acute inflammation. Vascular/Lymphatic: No significant vascular findings are present. No enlarged abdominal or pelvic lymph nodes. Reproductive: Prostate is unremarkable. Other: Negative for free air or free fluid. Moderate infiltration and inflammation within the anterior pelvic mesentery. Inflammatory process within the right lower quadrant/ upper pelvis, poorly defined, appears to be centered around non dilated small bowel loops. Edema and inflammation within the central mesentery. Musculoskeletal: No acute or suspicious bone lesion. Trace retrolisthesis of L5 on S1 IMPRESSION: 1. Moderate inflammatory changes within the abdomen and upper pelvis with poorly defined inflammatory process in the right lower  quadrant/upper pelvis. This appears to be centered around non dilated loops of small bowel, suggesting small bowel inflammation, possibly due to inflammatory bowel disease. Dilated segment of small bowel within the upper pelvis could relate to localized ileus. Mild diffuse generalized inflammation within the peritoneal cavity/central mesentery. No free air at this time. No organized abscess. There are mild changes of the transverse colon which could be due to mild colitis. 2. Sigmoid colon diverticular disease. 3. Hepatic steatosis. Stable small hyperenhancing focus in the dome of the liver, possibly a flash hemangioma. Electronically Signed   By: Jasmine Pang M.D.   On: 04/06/2017 01:21      Scheduled Meds: . enoxaparin (LOVENOX) injection  40 mg Subcutaneous Q24H   Continuous Infusions: . 0.9 % NaCl with KCl 40 mEq / L 100 mL/hr (04/07/17 1124)  . levofloxacin (LEVAQUIN) IV Stopped (04/06/17 2308)  . metronidazole Stopped (04/07/17 1040)     LOS: 1 day    Time spent: 30 minutes   Noralee Stain, DO [facility] [email or web address] Password [email or web address] 04/07/2017, 1:56 PM

## 2017-04-08 ENCOUNTER — Inpatient Hospital Stay (HOSPITAL_COMMUNITY): Payer: BLUE CROSS/BLUE SHIELD

## 2017-04-08 DIAGNOSIS — R112 Nausea with vomiting, unspecified: Secondary | ICD-10-CM

## 2017-04-08 LAB — CBC WITH DIFFERENTIAL/PLATELET
Basophils Absolute: 0 10*3/uL (ref 0.0–0.1)
Basophils Relative: 0 %
EOS ABS: 0 10*3/uL (ref 0.0–0.7)
Eosinophils Relative: 0 %
HEMATOCRIT: 39.1 % (ref 39.0–52.0)
HEMOGLOBIN: 13.6 g/dL (ref 13.0–17.0)
LYMPHS ABS: 0.8 10*3/uL (ref 0.7–4.0)
Lymphocytes Relative: 7 %
MCH: 31.3 pg (ref 26.0–34.0)
MCHC: 34.8 g/dL (ref 30.0–36.0)
MCV: 90.1 fL (ref 78.0–100.0)
MONOS PCT: 7 %
Monocytes Absolute: 0.8 10*3/uL (ref 0.1–1.0)
NEUTROS PCT: 86 %
Neutro Abs: 9.8 10*3/uL — ABNORMAL HIGH (ref 1.7–7.7)
Platelets: 146 10*3/uL — ABNORMAL LOW (ref 150–400)
RBC: 4.34 MIL/uL (ref 4.22–5.81)
RDW: 13 % (ref 11.5–15.5)
WBC: 11.4 10*3/uL — ABNORMAL HIGH (ref 4.0–10.5)

## 2017-04-08 LAB — COMPREHENSIVE METABOLIC PANEL
ALK PHOS: 50 U/L (ref 38–126)
ALT: 27 U/L (ref 17–63)
AST: 15 U/L (ref 15–41)
Albumin: 3.2 g/dL — ABNORMAL LOW (ref 3.5–5.0)
Anion gap: 11 (ref 5–15)
BILIRUBIN TOTAL: 3.3 mg/dL — AB (ref 0.3–1.2)
BUN: 12 mg/dL (ref 6–20)
CO2: 20 mmol/L — AB (ref 22–32)
CREATININE: 0.79 mg/dL (ref 0.61–1.24)
Calcium: 8.5 mg/dL — ABNORMAL LOW (ref 8.9–10.3)
Chloride: 109 mmol/L (ref 101–111)
Glucose, Bld: 111 mg/dL — ABNORMAL HIGH (ref 65–99)
Potassium: 3.5 mmol/L (ref 3.5–5.1)
Sodium: 140 mmol/L (ref 135–145)
Total Protein: 5.8 g/dL — ABNORMAL LOW (ref 6.5–8.1)

## 2017-04-08 LAB — GLUCOSE, CAPILLARY
Glucose-Capillary: 113 mg/dL — ABNORMAL HIGH (ref 65–99)
Glucose-Capillary: 103 mg/dL — ABNORMAL HIGH (ref 65–99)
Glucose-Capillary: 99 mg/dL (ref 65–99)

## 2017-04-08 LAB — C DIFFICILE QUICK SCREEN W PCR REFLEX
C DIFFICILE (CDIFF) INTERP: NOT DETECTED
C Diff antigen: NEGATIVE
C Diff toxin: NEGATIVE

## 2017-04-08 LAB — C-REACTIVE PROTEIN: CRP: 26.5 mg/dL — ABNORMAL HIGH (ref <1.0)

## 2017-04-08 LAB — SEDIMENTATION RATE: SED RATE: 46 mm/h — AB (ref 0–16)

## 2017-04-08 MED ORDER — PROMETHAZINE HCL 25 MG/ML IJ SOLN
12.5000 mg | Freq: Once | INTRAMUSCULAR | Status: AC | PRN
  Administered 2017-04-08: 12.5 mg via INTRAVENOUS
  Filled 2017-04-08: qty 1

## 2017-04-08 MED ORDER — PROMETHAZINE HCL 25 MG/ML IJ SOLN
12.5000 mg | Freq: Once | INTRAMUSCULAR | Status: AC
  Administered 2017-04-08: 12.5 mg via INTRAVENOUS
  Filled 2017-04-08: qty 1

## 2017-04-08 NOTE — Progress Notes (Signed)
This RN received report from previous RN, Ronaldo Miyamoto. I agree with the assessments charted. Will continue to monitor pt.

## 2017-04-08 NOTE — Progress Notes (Signed)
PROGRESS NOTE    Yandell Mcjunkins  BEM:[id number] DOB: 01-30-1981 DOA: 04/05/2017 PCP: Susy Frizzle, MD (Confirm with patient/family/NH records and if not entered, this HAS to be entered at [facility] point of entry. "No PCP" if truly none.)   Brief Narrative: (Start on day 1 of progress note - keep it brief and live) Grayling Reedis Maat Kafer 36 yo male with history of recurrent pelvic abscess and perforated viscus s/p ileocecectomyin early 2018. He presented to the ED with chief complaint of abdominal pain, nausea, vomiting without diarrhea. In the ER,CT of the abdomen and pelvis showedinflammation of the small bowel with possible ileus, colitis. No definite abscess. He was febrile, tachycardic with leukocytosis and lactic acidosis of 4.3.Patient was started on empiric antibiotics, admitted for further treatment.   Assessment & Plan:   Principal Problem:   Sepsis ([place]) Active Problems:   Colitis   Sepsis secondary to colitis -Hx of recurrent pelvic abscess and perforated viscus s/p ileocecectomyin Feb 2018 - no fam hx of IBD per report - notably path from small bowel resection in 09/2016 with "benign" findings, but given history would still benefit from GI follow up - will touch base with GI - Blood culture ngtd -General surgery following - ESR/CRP pending, neg c diff  - GI path panel ordered - will place NGT -Empiric levaquin/flagyl -NPO with sips of clears, ice   Hypokalemia -Replace, trend    DVT prophylaxis: lovenox Code Status: full  Family Communication: none in room  Disposition Plan: pending improvement   Consultants:   surgery  Procedures: (Don't include imaging studies which can be auto populated. Include things that cannot be auto populated i.e. Echo, Carotid and venous dopplers, Foley, Bipap, HD, tubes/drains, wound vac, central lines etc)  none  Antimicrobials: (specify start and planned stop date. Auto populated tables are space occupying and do not give  end dates) Anti-infectives    Start     Dose/Rate Route Frequency Ordered Stop   04/06/17 2200  levofloxacin (LEVAQUIN) IVPB 750 mg     750 mg 100 mL/hr over 90 Minutes Intravenous Every 24 hours 04/06/17 0509     04/06/17 1030  metroNIDAZOLE (FLAGYL) IVPB 500 mg     500 mg 100 mL/hr over 60 Minutes Intravenous Every 8 hours 04/06/17 1006     04/06/17 0600  aztreonam (AZACTAM) 2 g in dextrose 5 % 50 mL IVPB  Status:  Discontinued     2 g 100 mL/hr over 30 Minutes Intravenous Every 8 hours 04/06/17 0509 04/06/17 1003   04/06/17 0600  vancomycin (VANCOCIN) IVPB 1000 mg/200 mL premix  Status:  Discontinued     1,000 mg 200 mL/hr over 60 Minutes Intravenous Every 8 hours 04/06/17 0509 04/06/17 1004   04/06/17 0030  vancomycin (VANCOCIN) IVPB 1000 mg/200 mL premix     1,000 mg 200 mL/hr over 60 Minutes Intravenous  Once 04/06/17 0019 04/06/17 0208   04/06/17 0030  aztreonam (AZACTAM) 2 g in dextrose 5 % 50 mL IVPB     2 g 100 mL/hr over 30 Minutes Intravenous  Once 04/06/17 0019 04/06/17 0208   04/06/17 0030  levofloxacin (LEVAQUIN) IVPB 750 mg     750 mg 100 mL/hr over 90 Minutes Intravenous  Once 04/06/17 0019 04/06/17 0208           Subjective: Episode of emesis this morning.  Abdominal pain about the same.   Hasn't passed gas recently.  Objective: Vitals:   04/07/17 1500 04/07/17 2223 04/08/17 0552 04/08/17  0958  BP: 137/85 129/82 122/76 (!) 141/98  Pulse: 98 (!) 105 (!) 104 61  Resp: '18 18 18 18  ' Temp: 98.9 F (37.2 C) 98.7 F (37.1 C) 98.7 F (37.1 C) 97.7 F (36.5 C)  TempSrc: Other (Comment) Oral Oral Oral  SpO2: 97% 96% 96% 96%  Weight:      Height:        Intake/Output Summary (Last 24 hours) at 04/08/17 1347 Last data filed at 04/08/17 1026  Gross per 24 hour  Intake          2328.33 ml  Output              350 ml  Net          1978.33 ml   Filed Weights   04/05/17 2033 04/06/17 0338  Weight: 106.6 kg (235 lb) 114 kg (251 lb 5.2 oz)     Examination:  General exam: Appears calm and comfortable  Respiratory system: Clear to auscultation. Respiratory effort normal. Cardiovascular system: S1 & S2 heard, RRR. No JVD, murmurs, rubs, gallops or clicks. No pedal edema. Gastrointestinal system: Abdomen is mildly distened, mildly tender throughout.  No organomegaly.  Quiet bowel sounds.  Central nervous system: Alert and oriented. No focal neurological deficits. Extremities: Symmetric 5 x 5 power. Skin: No rashes, lesions or ulcers Psychiatry: Judgement and insight appear normal. Mood & affect appropriate.     Data Reviewed: I have personally reviewed following labs and imaging studies  CBC:  Recent Labs Lab 04/05/17 2125 04/06/17 0428 04/07/17 0444 04/08/17 0422  WBC 16.2* 14.6* 12.4* 11.4*  NEUTROABS  --  12.3* 10.4* 9.8*  HGB 17.6* 14.2 13.6 13.6  HCT 48.6 39.9 39.1 39.1  MCV 88.4 87.1 90.3 90.1  PLT 196 147* 143* 737*   Basic Metabolic Panel:  Recent Labs Lab 04/05/17 2125 04/06/17 0428 04/07/17 0444 04/08/17 0422  NA 137 137 138 140  K 3.7 3.8 3.3* 3.5  CL 105 107 108 109  CO2 21* 22 22 20*  GLUCOSE 125* 109* 101* 111*  BUN '20 17 11 12  ' CREATININE 0.99 0.89 0.77 0.79  CALCIUM 9.8 7.8* 8.3* 8.5*   GFR: Estimated Creatinine Clearance: 171.3 mL/min (by C-G formula based on SCr of 0.79 mg/dL). Liver Function Tests:  Recent Labs Lab 04/05/17 2125 04/06/17 0428 04/07/17 0444 04/08/17 0422  AST 34 '23 16 15  ' ALT 68* 46 34 27  ALKPHOS 78 48 51 50  BILITOT 2.6* 2.4* 4.5* 3.3*  PROT 7.7 5.7* 5.7* 5.8*  ALBUMIN 5.0 3.4* 3.4* 3.2*    Recent Labs Lab 04/05/17 2125  LIPASE 28   No results for input(s): AMMONIA in the last 168 hours. Coagulation Profile:  Recent Labs Lab 04/06/17 0428  INR 1.15   Cardiac Enzymes: No results for input(s): CKTOTAL, CKMB, CKMBINDEX, TROPONINI in the last 168 hours. BNP (last 3 results) No results for input(s): PROBNP in the last 8760 hours. HbA1C: No  results for input(s): HGBA1C in the last 72 hours. CBG:  Recent Labs Lab 04/06/17 2323 04/07/17 0741 04/07/17 1725 04/07/17 2358 04/08/17 0803  GLUCAP 108* 99 113* 113* 103*   Lipid Profile: No results for input(s): CHOL, HDL, LDLCALC, TRIG, CHOLHDL, LDLDIRECT in the last 72 hours. Thyroid Function Tests: No results for input(s): TSH, T4TOTAL, FREET4, T3FREE, THYROIDAB in the last 72 hours. Anemia Panel: No results for input(s): VITAMINB12, FOLATE, FERRITIN, TIBC, IRON, RETICCTPCT in the last 72 hours. Sepsis Labs:  Recent Labs Lab 04/06/17 0015 04/06/17  0115 04/06/17 0428 04/06/17 0800  PROCALCITON  --   --  0.57  --   LATICACIDVEN 4.04* 4.3* 2.5* 1.8  1.8    Recent Results (from the past 240 hour(s))  Blood Culture (routine x 2)     Status: None (Preliminary result)   Collection Time: 04/06/17 12:10 AM  Result Value Ref Range Status   Specimen Description BLOOD RIGHT HAND  Final   Special Requests   Final    BOTTLES DRAWN AEROBIC AND ANAEROBIC Blood Culture adequate volume   Culture   Final    NO GROWTH 1 DAY Performed at [facility], [place] [address] [place], [place] [address]    Report Status PENDING  Incomplete  Blood Culture (routine x 2)     Status: None (Preliminary result)   Collection Time: 04/06/17 12:23 AM  Result Value Ref Range Status   Specimen Description BLOOD LEFT FOREARM  Final   Special Requests IN PEDIATRIC BOTTLE Blood Culture adequate volume  Final   Culture   Final    NO GROWTH 1 DAY Performed at [facility], [place] [address], [place], [place] [address]    Report Status PENDING  Incomplete  C difficile quick scan w PCR reflex     Status: None   Collection Time: 04/08/17  8:24 AM  Result Value Ref Range Status   C Diff antigen NEGATIVE NEGATIVE Final   C Diff toxin NEGATIVE NEGATIVE Final   C Diff interpretation No C. difficile detected.  Final         Radiology Studies: Dg Abd 2 Views  Result Date:  04/08/2017 CLINICAL DATA:  Right lower quadrant pain EXAM: ABDOMEN - 2 VIEW COMPARISON:  04/06/2017 FINDINGS: Dilated small bowel loops with air-fluid levels compatible with small bowel obstruction. No free air organomegaly. Bibasilar atelectasis in the visualized lung bases. IMPRESSION: Dilated small bowel loops with air-fluid levels compatible with small bowel obstruction. Electronically Signed   By: Rolm Baptise M.D.   On: 04/08/2017 10:49        Scheduled Meds: . enoxaparin (LOVENOX) injection  40 mg Subcutaneous Q24H   Continuous Infusions: . sodium chloride 100 mL/hr at 04/08/17 1019  . levofloxacin (LEVAQUIN) IV Stopped (04/07/17 2327)  . metronidazole Stopped (04/08/17 1122)     LOS: 2 days    Time spent: 30 min    Fayrene Helper, MD [facility] [phone]   If 7PM-7AM, please contact night-coverage [email or web address] Password [email or web address] 04/08/2017, 1:47 PM

## 2017-04-08 NOTE — Progress Notes (Signed)
CC:  Abdominal pain  Subjective: He feels worse, more pain, just generalized, but worst is still RLQ.  Having nausea and burping allot.  Having stools that have gone from normal to loose.  He reports 4-5 loose stools yesterday.    Objective: Vital signs in last 24 hours: Temp:  [98.7 F (37.1 C)-98.9 F (37.2 C)] 98.7 F (37.1 C) (09/19 0552) Pulse Rate:  [98-105] 104 (09/19 0552) Resp:  [18] 18 (09/19 0552) BP: (122-137)/(76-85) 122/76 (09/19 0552) SpO2:  [96 %-97 %] 96 % (09/19 0552) Last BM Date: 04/07/17 600 Po 2000 IV Voided x 4 Emesis x 1 BM x 2 Labs continue to look good and WBC improving CT was 04/06/16 1 AM  Intake/Output from previous day: 09/18 0701 - 09/19 0700 In: 2568.3 [P.O.:600; I.V.:1518.3; IV Piggyback:450] Out: 200 [Emesis/NG output:200] Intake/Output this shift: No intake/output data recorded.  General appearance: alert, cooperative, no distress and he clearly feels worse than yesterday, still having allot of discomfort.   Resp: clear to auscultation bilaterally GI: soft, complains of generalized abdominal pain.  Worse still in RLQ, nausea and loose stools now.  Lab Results:   Recent Labs  04/07/17 0444 04/08/17 0422  WBC 12.4* 11.4*  HGB 13.6 13.6  HCT 39.1 39.1  PLT 143* 146*    BMET  Recent Labs  04/07/17 0444 04/08/17 0422  NA 138 140  K 3.3* 3.5  CL 108 109  CO2 22 20*  GLUCOSE 101* 111*  BUN 11 12  CREATININE 0.77 0.79  CALCIUM 8.3* 8.5*   PT/INR  Recent Labs  04/06/17 0428  LABPROT 14.6  INR 1.15     Recent Labs Lab 04/05/17 2125 04/06/17 0428 04/07/17 0444 04/08/17 0422  AST 34 ALT 68* 46 34 27  ALKPHOS 78 48 51 50  BILITOT 2.6* 2.4* 4.5* 3.3*  PROT 7.7 5.7* 5.7* 5.8*  ALBUMIN 5.0 3.4* 3.4* 3.2*     Lipase     Component Value Date/Time   LIPASE 28 04/05/2017 2125     Medications: . enoxaparin (LOVENOX) injection  40 mg Subcutaneous Q24H   Scheduled Meds: . enoxaparin (LOVENOX)  injection  40 mg Subcutaneous Q24H   . sodium chloride 100 mL/hr at 04/07/17 2157  . levofloxacin (LEVAQUIN) IV Stopped (04/07/17 2327)  . metronidazole Stopped (04/08/17 0335)   Anti-infectives    Start     Dose/Rate Route Frequency Ordered Stop   04/06/17 2200  levofloxacin (LEVAQUIN) IVPB 750 mg     750 mg 100 mL/hr over 90 Minutes Intravenous Every 24 hours 04/06/17 0509     04/06/17 1030  metroNIDAZOLE (FLAGYL) IVPB 500 mg     500 mg 100 mL/hr over 60 Minutes Intravenous Every 8 hours 04/06/17 1006     04/06/17 0600  aztreonam (AZACTAM) 2 g in dextrose 5 % 50 mL IVPB  Status:  Discontinued     2 g 100 mL/hr over 30 Minutes Intravenous Every 8 hours 04/06/17 0509 04/06/17 1003   04/06/17 0600  vancomycin (VANCOCIN) IVPB 1000 mg/200 mL premix  Status:  Discontinued     1,000 mg 200 mL/hr over 60 Minutes Intravenous Every 8 hours 04/06/17 0509 04/06/17 1004   04/06/17 0030  vancomycin (VANCOCIN) IVPB 1000 mg/200 mL premix     1,000 mg 200 mL/hr over 60 Minutes Intravenous  Once 04/06/17 0019 04/06/17 0208   04/06/17 0030  aztreonam (AZACTAM) 2 g in dextrose 5 % 50 mL IVPB  2 g 100 mL/hr over 30 Minutes Intravenous  Once 04/06/17 0019 04/06/17 0208   04/06/17 0030  levofloxacin (LEVAQUIN) IVPB 750 mg     750 mg 100 mL/hr over 90 Minutes Intravenous  Once 04/06/17 0019 04/06/17 0208     Assessment/Plan Abdominal pain, nausea and vomiting with new inflammatory changes RLQ and upper pelvis, possible colitis. 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 Donell Beers Diverticulosis Hepatic steatosis Patella alta - limited mobility BMI 33 FEN: sips and chips/IV fluids ID: Azactam 9/17 =>> day 1, Vancomycin 9/17 x 1,  Levofloxacin 9/17 = day 3, Flagyl 9/17 = day 7 DVT: SCD/Lovenox   Plan:  Check a 2 view film, c Diff, ice chips only.  Will discuss changing over to [facility], he is allergic to Pcn and cephalosporins.   If no improvement may need to do CT sooner than we would like. Watch labs closely.      LOS: 2 days    Labaron Digirolamo 04/08/2017 [phone]

## 2017-04-09 ENCOUNTER — Inpatient Hospital Stay (HOSPITAL_COMMUNITY): Payer: BLUE CROSS/BLUE SHIELD

## 2017-04-09 DIAGNOSIS — K56609 Unspecified intestinal obstruction, unspecified as to partial versus complete obstruction: Secondary | ICD-10-CM

## 2017-04-09 DIAGNOSIS — R935 Abnormal findings on diagnostic imaging of other abdominal regions, including retroperitoneum: Secondary | ICD-10-CM

## 2017-04-09 LAB — CBC WITH DIFFERENTIAL/PLATELET
Basophils Absolute: 0 10*3/uL (ref 0.0–0.1)
Basophils Relative: 0 %
EOS ABS: 0 10*3/uL (ref 0.0–0.7)
EOS PCT: 1 %
HCT: 36.7 % — ABNORMAL LOW (ref 39.0–52.0)
HEMOGLOBIN: 12.8 g/dL — AB (ref 13.0–17.0)
Lymphocytes Relative: 17 %
Lymphs Abs: 1.4 10*3/uL (ref 0.7–4.0)
MCH: 31.7 pg (ref 26.0–34.0)
MCHC: 34.9 g/dL (ref 30.0–36.0)
MCV: 90.8 fL (ref 78.0–100.0)
MONOS PCT: 9 %
Monocytes Absolute: 0.7 10*3/uL (ref 0.1–1.0)
NEUTROS PCT: 73 %
Neutro Abs: 5.8 10*3/uL (ref 1.7–7.7)
Platelets: 179 10*3/uL (ref 150–400)
RBC: 4.04 MIL/uL — ABNORMAL LOW (ref 4.22–5.81)
RDW: 13.2 % (ref 11.5–15.5)
WBC: 7.9 10*3/uL (ref 4.0–10.5)

## 2017-04-09 LAB — GLUCOSE, CAPILLARY
GLUCOSE-CAPILLARY: 91 mg/dL (ref 65–99)
GLUCOSE-CAPILLARY: 92 mg/dL (ref 65–99)
GLUCOSE-CAPILLARY: 95 mg/dL (ref 65–99)

## 2017-04-09 LAB — BASIC METABOLIC PANEL
Anion gap: 9 (ref 5–15)
BUN: 15 mg/dL (ref 6–20)
CHLORIDE: 111 mmol/L (ref 101–111)
CO2: 21 mmol/L — AB (ref 22–32)
CREATININE: 0.78 mg/dL (ref 0.61–1.24)
Calcium: 8.1 mg/dL — ABNORMAL LOW (ref 8.9–10.3)
GFR calc Af Amer: >60 mL/min (ref >60)
GFR calc non Af Amer: >60 mL/min (ref >60)
GLUCOSE: 92 mg/dL (ref 65–99)
POTASSIUM: 3.3 mmol/L — AB (ref 3.5–5.1)
Sodium: 141 mmol/L (ref 135–145)

## 2017-04-09 LAB — OCCULT BLOOD GASTRIC / DUODENUM (SPECIMEN CUP)
Occult Blood, Gastric: POSITIVE — AB
pH, Gastric: 1

## 2017-04-09 LAB — MAGNESIUM: MAGNESIUM: 1.7 mg/dL (ref 1.7–2.4)

## 2017-04-09 MED ORDER — FENTANYL CITRATE (PF) 100 MCG/2ML IJ SOLN
75.0000 ug | Freq: Once | INTRAMUSCULAR | Status: DC
  Filled 2017-04-09: qty 2

## 2017-04-09 MED ORDER — KCL-LACTATED RINGERS-D5W 20 MEQ/L IV SOLN
INTRAVENOUS | Status: DC
  Administered 2017-04-09 – 2017-04-15 (×12): via INTRAVENOUS
  Filled 2017-04-09 (×15): qty 1000

## 2017-04-09 MED ORDER — PROMETHAZINE HCL 25 MG/ML IJ SOLN
12.5000 mg | Freq: Once | INTRAMUSCULAR | Status: AC
  Administered 2017-04-09: 12.5 mg via INTRAVENOUS
  Filled 2017-04-09: qty 1

## 2017-04-09 MED ORDER — FAMOTIDINE IN NACL 20-0.9 MG/50ML-% IV SOLN
20.0000 mg | Freq: Two times a day (BID) | INTRAVENOUS | Status: DC
Start: 2017-04-09 — End: 2017-04-16
  Administered 2017-04-09 – 2017-04-15 (×14): 20 mg via INTRAVENOUS
  Filled 2017-04-09 (×16): qty 50

## 2017-04-09 NOTE — Consult Note (Signed)
[facility] Consult: 12:29 PM 04/09/2017  LOS: 3 days    Referring Provider: Dr Cephus Slater  Primary Care Physician:  Susy Frizzle, MD Primary Gastroenterologist:  unassigned    Reason for Consultation:  Enteritis, ? Colitis in pt with SBO.   HPI: [name] is a 36 y.o. male.  PMH legionella PNA with associated pancytopenia in 2009.  S/p bil  knee surgery.  S/p 2008 left inguinal hernia repair with mesh (Dr Bubba Camp).  S/p right inguinal hernia repair with mesh 2011 (Dr Dalbert Batman).   Multiple abx allergies.  No previous endo/colonoscopy.  07/2016 admission with pneumoperitoneum, abdominal abscess, SB fistula of unknown etiology.  Treated with percutaneous drainage.  Drained material grew E coli and enterococcal faecalis.  All drains removed by 09/23/16 at which time fistula had resolved.  Pt relays that the sxs at presentation began within a few hours of eating sausage and gravy which he realized contained chipped bones after he took a bite of the whole sausage.   Readmitted 3/25 - 10/20/16 with recurrent abscesses, bowel and mesenteric inflammation.  Underwent CT guided drainage 3/26 and 3/29 diagnostic laparoscopy converted to laparotomy with drainage of abdominal abscess and ileocecectomy by Dr Barry Dienes.  Pathology on SB: Benign SB with serosal adhesions with serositis and areas of foreign body giant cell reaction with fecal material, presumably from prior perforation. Benign appendix.  4 benign lymph nodes.  No dysplasia or malignancy.    Pt ~ 2 months of diarrheal stools post op, but by late June, early July stools finally formed again, ocurring 1 to 2 x per day.  Good appetite and food tolerance.  No stomach upset.  Weight stable for a while now but  note that he's gained 18 pounds since late March 2018.  .   ~ 10  days ago, helped carry a heavy steel item at work.  Afterwards his belly "didn't feel right", was a bit upset and uncomfortable.  Last week he noticed increased borborygmi.  Sunday, 4 days ago, onset profuse diaphoresis, then mid abdominal pain, then green bilious emesis.  Non-focal but in region of right mid and lower abdomen.  sxs reminiscent of past presentations.   Presented to ED that evening of 9/16.  Met sepsis criteria.   WBCs 16.2 >> 7.9 over 5 days.  Initial elevated lactate has resolved.  CRP is 26.5 (RR is <1).  Sed rate is elevated.  Stool c diff negative.  Gastric contents are occult blood positive.    CT Abdomen 04/06/17:  1. Moderate inflammatory changes within the abdomen and upper pelvis with poorly defined inflammatory process in the right lower quadrant/upper pelvis. This appears to be centered around non dilated loops of small bowel, suggesting small bowel inflammation, possibly due to inflammatory bowel disease. Dilated segment of small bowel within the upper pelvis could relate to localized ileus. Mild diffuse generalized inflammation within the peritoneal cavity/central mesentery. No free air at this time. No organized abscess. There are mild changes of the transverse colon which could be due to mild colitis. 2. Sigmoid  colon diverticular disease. 3. Hepatic steatosis. Stable small hyperenhancing focus in the dome of the liver, possibly a flash hemangioma. Abdominal films 9/19, 9/20 with persistent SBO though improving level of bowel distention on today's film.     Vancomycin, Aztreonam, Levaquin given in ED.   Current abx are Levaquin, Flagyl.  NGT placed, put out 30 ml yesterday. Initially the tube output was green, bilious but within a few hours turned brownish burgundy colored and tested FOBT positive.  Patient never ceased having bowel movements and they are less regular than is his norm. Initially he had some hard stools and more recently the stools have been loose and of a  greenish yellow color without any bloody looking or melenic looking material.     Maternal GM with hx diverticulitis but no FHx of IBD.  Employed as a Freight forwarder at [employer].  Doesn't consume alcoholic beverages or illicit drugs. Never smoked.     Past Medical History:  Diagnosis Date  . Legionella pneumonia ([place])    2009    Past Surgical History:  Procedure Laterality Date  . HERNIA REPAIR    . IR GENERIC HISTORICAL  09/09/2016   IR RADIOLOGIST EVAL & MGMT 09/09/2016 Greggory Keen, MD [facility] INTERV RAD  . IR RADIOLOGIST EVAL & MGMT  09/23/2016  . KNEE SURGERY Left   . LAPAROSCOPIC SMALL BOWEL RESECTION N/A 10/16/2016   Procedure: LAPAROSCOPIC SMALL BOWEL RESECTION POSSIBLE EXPLORATORY LAPAROTOMY AND POSSIBLE OPEN Krupp OUT ileocectomy;  Surgeon: Stark Klein, MD;  Location: [facility];  Service: General;  Laterality: N/A;    Prior to Admission medications   Medication Sig Start Date End Date Taking? Authorizing Provider  acetaminophen (TYLENOL) 325 MG tablet Take 2 tablets (650 mg total) by mouth every 6 (six) hours as needed for mild pain or headache. 10/20/16  Yes Simaan, Darci Current, PA-C  loratadine (CLARITIN) 10 MG tablet Take 10 mg by mouth daily.   Yes [provider]  saccharomyces boulardii (FLORASTOR) 250 MG capsule Take 1 capsule (250 mg total) by mouth 2 (two) times daily. 10/20/16  Yes Jill Alexanders, PA-C  oxyCODONE-acetaminophen (PERCOCET/ROXICET) 5-325 MG tablet Take 1-2 tablets by mouth every 6 (six) hours as needed for moderate pain. Patient not taking: Reported on 04/05/2017 10/20/16   Jill Alexanders, PA-C    Scheduled Meds: . enoxaparin (LOVENOX) injection  40 mg Subcutaneous Q24H  . fentaNYL (SUBLIMAZE) injection  75 mcg Intravenous Once   Infusions: . dextrose 5% lactated ringers with KCl 20 mEq/L 100 mL/hr at 04/09/17 1022  . famotidine (PEPCID) IV Stopped (04/09/17 1037)  . levofloxacin (LEVAQUIN) IV Stopped (04/08/17 2228)  .  metronidazole 500 mg (04/09/17 1221)   PRN Meds: acetaminophen **OR** acetaminophen, fentaNYL (SUBLIMAZE) injection, ketorolac, ondansetron **OR** ondansetron (ZOFRAN) IV, traMADol   Allergies as of 04/05/2017 - Review Complete 04/05/2017  Allergen Reaction Noted  . Keflex [cephalexin] Anaphylaxis and Rash 05/25/2015  . Penicillins Other (See Comments) 05/25/2015  . Sulfa antibiotics Other (See Comments) 05/25/2015    Family History  Problem Relation Age of Onset  . Diabetes Father   . Cancer Paternal Grandfather        oral cancer  . Mental illness Mother     Social History   Social History  . Marital status: Married    Spouse name: N/A  . Number of children: N/A  . Years of education: N/A   Occupational History  . Not on file.   Social History Main Topics  .  Smoking status: Never Smoker  . Smokeless tobacco: Never Used  . Alcohol use No  . Drug use: No  . Sexual activity: Yes     Comment: married, works at [employer]   Other Topics Concern  . Not on file   Social History Narrative  . No narrative on file    REVIEW OF SYSTEMS: Constitutional:  No weakness or fatigue ENT:  No nose bleeds Pulm:  He has a cough now that the NG tube is present in place but this was not present previously. CV:  Some esophageal chest discomfort now. The NG tube is in place. No palpitations, no LE edema.  GU:  No hematuria, no frequency GI:  Per HPI Heme:  No unusual bleeding or bruising. No history of anemia.   Transfusions:  None ever Neuro:  No headaches, no peripheral tingling or numbness Derm:  No itching, no rash or sores.  Endocrine:  No sweats or chills.  No polyuria or dysuria Immunization:  Did not inquire Travel:  No out a lot of the [place] travel in last few months.    PHYSICAL EXAM: Vital signs in last 24 hours: Vitals:   04/09/17 0436 04/09/17 1215  BP: 122/66 135/72  Pulse: 91 88  Resp: 18 19  Temp: 98.4 F (36.9 C) 99 F (37.2 C)  SpO2: 93% 94%   Wt  Readings from Last 3 Encounters:  04/06/17 114 kg (251 lb 5.2 oz)  10/12/16 105.7 kg (233 lb)  08/20/16 106.6 kg (235 lb)    General: Pleasant, well appearing WF with NG tube in place. He is comfortable. Head:  No facial asymmetry, swelling or signs of trauma  Eyes:  No scleral icterus, no conjunctival pallor. EOMI. Ears:  Not hard of hearing.  Nose:  No discharge or congestion.  NG tube in place, drainage is burgundy brown looks like CGE Mouth:  Oropharynx moist, clear. Good dentition. Tongue midline. Neck:  No JVD, no masses, no thyromegaly. Lungs:  Clear bilaterally. Has a productive cough. Heart: RRR. No MRG. S1, S2 present. Abdomen:  Soft. Minimal mid abdominal tenderness. No guarding or rebound. Bowel sounds quiet.  Erythematous but fully intact lower midline surgical scar.   Rectal: Deferred rectal exam   Musc/Skeltl: No joint swelling, erythema or gross deformity Extremities:  No CCE.   Neurologic:  Alert. Oriented times 3. Full limb strength. No tremor Skin:  No rashes, no sores, no significant purpura or bruising. Tattoos:  None observed Nodes:  No cervical or inguinal adenopathy.   Psych:  Mildly anxious but very cooperative and able to provide excellent history with lots of details. Does not appear depressed.  Intake/Output from previous day: 09/19 0701 - 09/20 0700 In: 3248.3 [I.V.:2798.3; IV Piggyback:450] Out: 451 [Emesis/NG output:450; Stool:1] Intake/Output this shift: Total I/O In: 0  Out: 4 [Urine:2; Stool:2]  LAB RESULTS:  Recent Labs  04/07/17 0444 04/08/17 0422 04/09/17 0246  WBC 12.4* 11.4* 7.9  HGB 13.6 13.6 12.8*  HCT 39.1 39.1 36.7*  PLT 143* 146* 179   BMET Lab Results  Component Value Date   NA 141 04/09/2017   NA 140 04/08/2017   NA 138 04/07/2017   K 3.3 (L) 04/09/2017   K 3.5 04/08/2017   K 3.3 (L) 04/07/2017   CL 111 04/09/2017   CL 109 04/08/2017   CL 108 04/07/2017   CO2 21 (L) 04/09/2017   CO2 20 (L) 04/08/2017   CO2 22  04/07/2017   GLUCOSE 92 04/09/2017  GLUCOSE 111 (H) 04/08/2017   GLUCOSE 101 (H) 04/07/2017   BUN 15 04/09/2017   BUN 12 04/08/2017   BUN 11 04/07/2017   CREATININE 0.78 04/09/2017   CREATININE 0.79 04/08/2017   CREATININE 0.77 04/07/2017   CALCIUM 8.1 (L) 04/09/2017   CALCIUM 8.5 (L) 04/08/2017   CALCIUM 8.3 (L) 04/07/2017   LFT  Recent Labs  04/07/17 0444 04/08/17 0422  PROT 5.7* 5.8*  ALBUMIN 3.4* 3.2*  AST 16 15  ALT 34 27  ALKPHOS 51 50  BILITOT 4.5* 3.3*   PT/INR Lab Results  Component Value Date   INR 1.15 04/06/2017   INR 1.00 10/12/2016   INR 1.05 08/21/2016   Hepatitis Panel No results for input(s): HEPBSAG, HCVAB, HEPAIGM, HEPBIGM in the last 72 hours. C-Diff No components found for: CDIFF Lipase     Component Value Date/Time   LIPASE 28 04/05/2017 2125    Drugs of Abuse  No results found for: LABOPIA, COCAINSCRNUR, LABBENZ, AMPHETMU, THCU, LABBARB   RADIOLOGY STUDIES: Dg Abd 2 Views  Result Date: 04/08/2017 CLINICAL DATA:  Right lower quadrant pain EXAM: ABDOMEN - 2 VIEW COMPARISON:  04/06/2017 FINDINGS: Dilated small bowel loops with air-fluid levels compatible with small bowel obstruction. No free air organomegaly. Bibasilar atelectasis in the visualized lung bases. IMPRESSION: Dilated small bowel loops with air-fluid levels compatible with small bowel obstruction. Electronically Signed   By: Rolm Baptise M.D.   On: 04/08/2017 10:49   Dg Abd Portable 1v  Result Date: 04/08/2017 CLINICAL DATA:  NG tube placement EXAM: PORTABLE ABDOMEN - 1 VIEW COMPARISON:  04/08/2017, 04/06/2017 FINDINGS: NG tube within the stomach which is collapsed. Dilated small bowel in the mid and left abdomen with loops measuring 4.7 cm in diameter. Findings compatible with small-bowel obstruction. No acute osseous finding. Bibasilar atelectasis. No abnormal calcifications. IMPRESSION: NG tube within the stomach. Persistent small bowel dilatation compatible with obstruction  Electronically Signed   By: Jerilynn Mages.  Shick M.D.   On: 04/08/2017 20:39   Dg Abd Portable 2v  Result Date: 04/09/2017 CLINICAL DATA:  Slight increase of mid abdominal pain. EXAM: PORTABLE ABDOMEN - 2 VIEW COMPARISON:  04/08/2017 FINDINGS: The enteric tube tip is in the left upper quadrant consistent with location in the upper stomach. Persistent gaseous distention of small bowel with air-fluid levels consistent with small bowel obstruction. The degree of distention appears to be less than on the previous study. No free intra-abdominal air. No radiopaque stones. Visualized bones appear intact. IMPRESSION: Gaseous distention of small bowel with air-fluid levels consistent with small bowel obstruction. Degree of distention appears to be improving since previous study. Electronically Signed   By: Lucienne Capers M.D.   On: 04/09/2017 03:35    IMPRESSION:   *  SBO with enteritis and ? Transverse colitis in pt with hx of abdominal abscesses.  S/p 10/16/16 diagnostic laparotomy with drainage of abdominal abscess and ileocecectomy.  Pathology did not reveal signs of Crohns.  However the clinical hx and picture, elevated CRP and ESR are certainly suspicious for IBD/Crohns.  Initially felt to have developed perforation after eating fragments of pork bone.  *  CG gastric material, occult blood +.  ? NGT trauma, ? PUD.  IV Pepcid initiated.     *  Hypokalemia. Potassium added to IVF.      PLAN:     *  Supportive care with NGT suction, serial abdominal films, antibiotics. CBC, BMET in AM.    *  Plan Colonoscopy sometime in future  weeks to eval colon and neoterminal ileum   Azucena Freed  04/09/2017, 12:29 PM Pager: [phone]  GI ATTENDING  History, laboratories, x-rays, prior surgery, surgical pathology all reviewed. Patient personally seen and examined. Agree with comprehensive consultation note as outlined above. Complicated case that began with intra-abdominal perforation with abscess and fistula of  the small bowel. Treated with percutaneous drainage and antibiotics. Repeat presentation necessitating ileocecectomy. Pathology inconclusive. Working diagnosis seemed to be intestinal perforation from foreign body (bone). Now with symptomatic small bowel obstruction. CT scan suggests some inflammation of the colon and small bowel. A question of inflammatory bowel disease (Crohn) raised. This is certainly a possibility. At this point the primary goal is treatment of his small bowel obstruction. Hopefully will not need repeat surgery. After this is accomplished, purge his gut for colonoscopy with ileal intubation to assess for inflammatory bowel disease. General follow-up.  Docia Chuck. Geri Seminole., M.D. [facility] Division of Gastroenterology

## 2017-04-09 NOTE — Progress Notes (Signed)
Pharmacy Antibiotic Note  [name] is a 36 y.o. male presented to the ED on 04/05/2017 with c/o abdominal pain and n/v.  Broad abx were started on admission for suspected sepsis secondary to intra-abdominal infection.  Patient's currently on day # 3 of levaquin and flagyl for suspected colitis.  Today, 04/09/2017: -  afeb, wbc down wnl - scr ok (crcl~100) -  LA down 2.5, PCT 0.57, CRP 26.5   Plan: - continue Levofloxacin  IV q24h.  Currently dose is appropriate for indication and renal function.  Pharmacy will sign off.  Re-consult [place] if need further assistance. - continue flagyl 500 mg IV q8h per MD  ______________________________________  Height:  (188 cm) Weight: 251 lb 5.2 oz (114 kg) IBW/kg (Calculated) : 82.2  Temp (24hrs), Avg:98.7 F (37.1 C), Min:98.4 F (36.9 C), Max:98.9 F (37.2 C)   Recent Labs Lab 04/05/17 2125 04/06/17 0015 04/06/17 0115 04/06/17 0428 04/06/17 0800 04/07/17 0444 04/08/17 0422 04/09/17 0246  WBC 16.2*  --   --  14.6*  --  12.4* 11.4* 7.9  CREATININE 0.99  --   --  0.89  --  0.77 0.79 0.78  LATICACIDVEN  --  4.04* 4.3* 2.5* 1.8  1.8  --   --   --     Estimated Creatinine Clearance: 171.3 mL/min (by C-G formula based on SCr of 0.78 mg/dL).    Allergies  Allergen Reactions  . Keflex [Cephalexin] Anaphylaxis and Rash  . Penicillins Other (See Comments)    Childhood Has patient had a PCN reaction causing immediate rash, facial/tongue/throat swelling, SOB or lightheadedness with hypotension: uknown Has patient had a PCN reaction causing severe rash involving mucus membranes or skin necrosis: unknown Has patient had a PCN reaction that required hospitalization unknown Has patient had a PCN reaction occurring within the last 10 years: uknown If all of the above answers are "NO", then may proceed with Cephalosporin use.   . Sulfa Antibiotics Other (See Comments)    Unknown. Childhood Allergy    Antimicrobials this  admission:  9/17 Vancomycin  >>9/17 9/17 Aztreonam  >> 9/17 9/17 Levofloxacin  >> 9/17 flagyl>>  Dose adjustments this admission:  --  Microbiology results:  9/17 BCx x2:  9/19 cdiff: neg  Thank you for allowing pharmacy to be a part of this patient's care.  Lucia Gaskins 04/09/2017 10:17 AM

## 2017-04-09 NOTE — Progress Notes (Addendum)
This RN assessed pt's NG tube drainage to be dark brown with red sediment present at this time, not present on prior assessment. On call Schorr paged and notified and ordered occult blood gastric sample to be taken. Sample sent down to lab. Pt's vital signs stable. Will continue to monitor pt closely.    2130: On call Schorr notified of results of gastric occult blood sample.

## 2017-04-09 NOTE — Progress Notes (Signed)
ZO:[id number] pain   Subjective: He is a little better this AM.  NG is bloody but I think it's just irritation.  He says he is less bloated, no further nausea or vomiting.   I think the pain is a little better this AM. NG sump was full of blood. I flushed this with air and asked nursing staff to replace the filter. Decrease the suction to 50 mmHg  Objective: Vital signs in last 24 hours: Temp:  [97.7 F (36.5 C)-98.9 F (37.2 C)] 98.4 F (36.9 C) (09/20 0436) Pulse Rate:  [61-104] 91 (09/20 0436) Resp:  [18-20] 18 (09/20 0436) BP: (122-141)/(66-98) 122/66 (09/20 0436) SpO2:  [93 %-98 %] 93 % (09/20 0436) Last BM Date: 04/08/17 NPO 3200 IV Urine 250 recorded NG 450 BM x 1 Afebrile, VSS K+ 3.3, BMP OK WBC 7.9 C diff negative Film this AM shows ongoing SB distension/air fluid levels Intake/Output from previous day: 09/19 0701 - 09/20 0700 In: 3248.3 [I.V.:2798.3; IV Piggyback:450] Out: 451 [Emesis/NG output:450; Stool:1] Intake/Output this shift: No intake/output data recorded.  General appearance: alert, cooperative, no distress and soft, still distention this a.m. Discomfort is better this a.m. NG tube has some bloody appearing drainage. Resp: alert, cooperative, no distress and much more comfortable this AM. GI: Soft, less distention, his pain seems to be less severe this a.m. Nausea has resolved. Still having multiple loose bowel movements but fewer than yesterday. NG drainage is somewhat dark red/old blood and appearance.  Lab Results:   Recent Labs  04/08/17 0422 04/09/17 0246  WBC 11.4* 7.9  HGB 13.6 12.8*  HCT 39.1 36.7*  PLT 146* 179    BMET  Recent Labs  04/08/17 0422 04/09/17 0246  NA 140 141  K 3.5 3.3*  CL 109 111  CO2 20* 21*  GLUCOSE 111* 92  BUN 12 15  CREATININE 0.79 0.78  CALCIUM 8.5* 8.1*   PT/INR No results for input(s): LABPROT, INR in the last 72 hours.   Recent Labs Lab 04/05/17 2125 04/06/17 0428 04/07/17 0444  04/08/17 0422  AST 34 ALT 68* 46 34 27  ALKPHOS 78 48 51 50  BILITOT 2.6* 2.4* 4.5* 3.3*  PROT 7.7 5.7* 5.7* 5.8*  ALBUMIN 5.0 3.4* 3.4* 3.2*     Lipase     Component Value Date/Time   LIPASE 28 04/05/2017 2125     Medications: . enoxaparin (LOVENOX) injection  40 mg Subcutaneous Q24H  . fentaNYL (SUBLIMAZE) injection  75 mcg Intravenous Once   . sodium chloride 100 mL/hr at 04/08/17 2057  . famotidine (PEPCID) IV    . levofloxacin (LEVAQUIN) IV Stopped (04/08/17 2228)  . metronidazole Stopped (04/09/17 0223)   Anti-infectives    Start     Dose/Rate Route Frequency Ordered Stop   04/06/17 2200  levofloxacin (LEVAQUIN) IVPB 750 mg     750 mg 100 mL/hr over 90 Minutes Intravenous Every 24 hours 04/06/17 0509     04/06/17 1030  metroNIDAZOLE (FLAGYL) IVPB 500 mg     500 mg 100 mL/hr over 60 Minutes Intravenous Every 8 hours 04/06/17 1006     04/06/17 0600  aztreonam (AZACTAM) 2 g in dextrose 5 % 50 mL IVPB  Status:  Discontinued     2 g 100 mL/hr over 30 Minutes Intravenous Every 8 hours 04/06/17 0509 04/06/17 1003   04/06/17 0600  vancomycin (VANCOCIN) IVPB 1000 mg/200 mL premix  Status:  Discontinued     1,000 mg 200  mL/hr over 60 Minutes Intravenous Every 8 hours 04/06/17 0509 04/06/17 1004   04/06/17 0030  vancomycin (VANCOCIN) IVPB 1000 mg/200 mL premix     1,000 mg 200 mL/hr over 60 Minutes Intravenous  Once 04/06/17 0019 04/06/17 0208   04/06/17 0030  aztreonam (AZACTAM) 2 g in dextrose 5 % 50 mL IVPB     2 g 100 mL/hr over 30 Minutes Intravenous  Once 04/06/17 0019 04/06/17 0208   04/06/17 0030  levofloxacin (LEVAQUIN) IVPB 750 mg     750 mg 100 mL/hr over 90 Minutes Intravenous  Once 04/06/17 0019 04/06/17 0208      Assessment/Plan Abdominal pain, nausea and vomiting with new inflammatory changes RLQ and upper pelvis, possible colitis. 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 Donell Beers Diverticulosis Hepatic steatosis Patella alta - limited mobility BMI 33 FEN: sips and chips/IV fluids ID: Azactam 9/17 =>>day 1, Vancomycin 9/17 x 1, Levofloxacin 9/17 = day 3, Flagyl 9/17 = day 8 DVT: SCD/Lovenox    Plan: I flushed the NG sump with air. Decrease the suction. We'll start him on Pepcid 20 mg twice a day IV. His CBC shows WBC is stable, H&H is down minimally. Continue current treatment, replace potassium and IV fluids. Check magnesium. Repeat CT scan tomorrow. GI consult and GI panel is pending.    LOS: 3 days    Lauramae Kneisley 04/09/2017 [phone]

## 2017-04-09 NOTE — Progress Notes (Signed)
PROGRESS NOTE    Nimesh Riolo  MBE:[id number] DOB: 12/30/1980 DOA: 04/05/2017 PCP: Susy Frizzle, MD (Confirm with patient/family/NH records and if not entered, this HAS to be entered at [facility] point of entry. "No PCP" if truly none.)   Brief Narrative: (Start on day 1 of progress note - keep it brief and live) [name] 36 yo male with history of recurrent pelvic abscess and perforated viscus s/p ileocecectomyin early 2018. He presented to the ED with chief complaint of abdominal pain, nausea, vomiting without diarrhea. In the ER,CT of the abdomen and pelvis showedinflammation of the small bowel with possible ileus, colitis. No definite abscess. He was febrile, tachycardic with leukocytosis and lactic acidosis of 4.3.Patient was started on empiric antibiotics, admitted for further treatment.   Assessment & Plan:   Principal Problem:   Sepsis ([place]) Active Problems:   Colitis   Sepsis secondary to colitis  SBO -Hx of recurrent pelvic abscess and perforated viscus s/p ileocecectomyin Feb 2018 - no fam hx of IBD per report - notably path from small bowel resection in 09/2016 with "benign" findings, but given history would still benefit from GI follow up - plain films with evidence of SBO '[ ]'  plan for CT tomorrow by surgery - NG tube in place - GI consult - Blood culture ngtd - General surgery following - ESR/CRP elevated, neg c diff  '[ ]'  GI path panel pending -Empiric levaquin/flagyl -NPO with sips of clears, ice   Hemoccult Positive NG tube output: Possibly due to trauma from NG placement vs inflammation (gastritis, PUD), ctm.  - stable H/H - pepcid  Hypokalemia -Replace, trend - k in fluids    DVT prophylaxis: lovenox Code Status: full  Family Communication: none in room  Disposition Plan: pending improvement   Consultants:   surgery  Procedures: (Don't include imaging studies which can be auto populated. Include things that cannot be auto  populated i.e. Echo, Carotid and venous dopplers, Foley, Bipap, HD, tubes/drains, wound vac, central lines etc)  none  Antimicrobials: (specify start and planned stop date. Auto populated tables are space occupying and do not give end dates) Anti-infectives    Start     Dose/Rate Route Frequency Ordered Stop   04/06/17 2200  levofloxacin (LEVAQUIN) IVPB 750 mg     750 mg 100 mL/hr over 90 Minutes Intravenous Every 24 hours 04/06/17 0509     04/06/17 1030  metroNIDAZOLE (FLAGYL) IVPB 500 mg     500 mg 100 mL/hr over 60 Minutes Intravenous Every 8 hours 04/06/17 1006     04/06/17 0600  aztreonam (AZACTAM) 2 g in dextrose 5 % 50 mL IVPB  Status:  Discontinued     2 g 100 mL/hr over 30 Minutes Intravenous Every 8 hours 04/06/17 0509 04/06/17 1003   04/06/17 0600  vancomycin (VANCOCIN) IVPB 1000 mg/200 mL premix  Status:  Discontinued     1,000 mg 200 mL/hr over 60 Minutes Intravenous Every 8 hours 04/06/17 0509 04/06/17 1004   04/06/17 0030  vancomycin (VANCOCIN) IVPB 1000 mg/200 mL premix     1,000 mg 200 mL/hr over 60 Minutes Intravenous  Once 04/06/17 0019 04/06/17 0208   04/06/17 0030  aztreonam (AZACTAM) 2 g in dextrose 5 % 50 mL IVPB     2 g 100 mL/hr over 30 Minutes Intravenous  Once 04/06/17 0019 04/06/17 0208   04/06/17 0030  levofloxacin (LEVAQUIN) IVPB 750 mg     750 mg 100 mL/hr over 90 Minutes Intravenous  Once 04/06/17 0019 04/06/17 0208          Subjective: Passing some gas, some loose stools.  Distention is better.  Objective: Vitals:   04/08/17 2137 04/09/17 0200 04/09/17 0436 04/09/17 1215  BP: 125/78 126/72 122/66 135/72  Pulse: (!) 104 98 91 88  Resp: '20 18 18 19  ' Temp: 98.8 F (37.1 C) 98.9 F (37.2 C) 98.4 F (36.9 C) 99 F (37.2 C)  TempSrc: Oral Oral Oral Oral  SpO2: 96% 94% 93% 94%  Weight:      Height:        Intake/Output Summary (Last 24 hours) at 04/09/17 1321 Last data filed at 04/09/17 1216  Gross per 24 hour  Intake           2406.67 ml  Output              305 ml  Net          2101.67 ml   Filed Weights   04/05/17 2033 04/06/17 0338  Weight: 106.6 kg (235 lb) 114 kg (251 lb 5.2 oz)    Examination:  General: No acute distress. Cardiovascular: Heart sounds show Yanira Tolsma regular rate, and rhythm. No gallops or rubs. No murmurs. No JVD. Lungs: Clear to auscultation bilaterally with good air movement. No rales, rhonchi or wheezes. Abdomen: Soft, nontender, nondistended with queit bowel sounds. No masses. No hepatosplenomegaly. Neurological: Alert and oriented 3. Moves all extremities 4 with equal strength. Cranial nerves II through XII grossly intact. Skin: Warm and dry. No rashes or lesions. Extremities: No clubbing or cyanosis. No edema. Pedal pulses 2+. Psychiatric: Mood and affect are normal. Insight and judgment are appropriate.    Data Reviewed: I have personally reviewed following labs and imaging studies  CBC:  Recent Labs Lab 04/05/17 2125 04/06/17 0428 04/07/17 0444 04/08/17 0422 04/09/17 0246  WBC 16.2* 14.6* 12.4* 11.4* 7.9  NEUTROABS  --  12.3* 10.4* 9.8* 5.8  HGB 17.6* 14.2 13.6 13.6 12.8*  HCT 48.6 39.9 39.1 39.1 36.7*  MCV 88.4 87.1 90.3 90.1 90.8  PLT 196 147* 143* 146* 024   Basic Metabolic Panel:  Recent Labs Lab 04/05/17 2125 04/06/17 0428 04/07/17 0444 04/08/17 0422 04/09/17 0246  NA 137 137 138 140 141  K 3.7 3.8 3.3* 3.5 3.3*  CL 105 107 108 109 111  CO2 21* 22 22 20* 21*  GLUCOSE 125* 109* 101* 111* 92  BUN '20 17 11 12 15  ' CREATININE 0.99 0.89 0.77 0.79 0.78  CALCIUM 9.8 7.8* 8.3* 8.5* 8.1*  MG  --   --   --   --  1.7   GFR: Estimated Creatinine Clearance: 171.3 mL/min (by C-G formula based on SCr of 0.78 mg/dL). Liver Function Tests:  Recent Labs Lab 04/05/17 2125 04/06/17 0428 04/07/17 0444 04/08/17 0422  AST 34 '23 16 15  ' ALT 68* 46 34 27  ALKPHOS 78 48 51 50  BILITOT 2.6* 2.4* 4.5* 3.3*  PROT 7.7 5.7* 5.7* 5.8*  ALBUMIN 5.0 3.4* 3.4* 3.2*     Recent Labs Lab 04/05/17 2125  LIPASE 28   No results for input(s): AMMONIA in the last 168 hours. Coagulation Profile:  Recent Labs Lab 04/06/17 0428  INR 1.15   Cardiac Enzymes: No results for input(s): CKTOTAL, CKMB, CKMBINDEX, TROPONINI in the last 168 hours. BNP (last 3 results) No results for input(s): PROBNP in the last 8760 hours. HbA1C: No results for input(s): HGBA1C in the last 72 hours. CBG:  Recent Labs  Lab 04/07/17 2358 04/08/17 0803 04/08/17 1729 04/09/17 0017 04/09/17 1208  GLUCAP 113* 103* 99 95 92   Lipid Profile: No results for input(s): CHOL, HDL, LDLCALC, TRIG, CHOLHDL, LDLDIRECT in the last 72 hours. Thyroid Function Tests: No results for input(s): TSH, T4TOTAL, FREET4, T3FREE, THYROIDAB in the last 72 hours. Anemia Panel: No results for input(s): VITAMINB12, FOLATE, FERRITIN, TIBC, IRON, RETICCTPCT in the last 72 hours. Sepsis Labs:  Recent Labs Lab 04/06/17 0015 04/06/17 0115 04/06/17 0428 04/06/17 0800  PROCALCITON  --   --  0.57  --   LATICACIDVEN 4.04* 4.3* 2.5* 1.8  1.8    Recent Results (from the past 240 hour(s))  Blood Culture (routine x 2)     Status: None (Preliminary result)   Collection Time: 04/06/17 12:10 AM  Result Value Ref Range Status   Specimen Description BLOOD RIGHT HAND  Final   Special Requests   Final    BOTTLES DRAWN AEROBIC AND ANAEROBIC Blood Culture adequate volume   Culture   Final    NO GROWTH 2 DAYS Performed at [facility], [place] [address], [place], [place] [address]    Report Status PENDING  Incomplete  Blood Culture (routine x 2)     Status: None (Preliminary result)   Collection Time: 04/06/17 12:23 AM  Result Value Ref Range Status   Specimen Description BLOOD LEFT FOREARM  Final   Special Requests IN PEDIATRIC BOTTLE Blood Culture adequate volume  Final   Culture   Final    NO GROWTH 2 DAYS Performed at [facility], [place] [address], [place], [place] [address]    Report  Status PENDING  Incomplete  C difficile quick scan w PCR reflex     Status: None   Collection Time: 04/08/17  8:24 AM  Result Value Ref Range Status   C Diff antigen NEGATIVE NEGATIVE Final   C Diff toxin NEGATIVE NEGATIVE Final   C Diff interpretation No C. difficile detected.  Final         Radiology Studies: Dg Abd 2 Views  Result Date: 04/08/2017 CLINICAL DATA:  Right lower quadrant pain EXAM: ABDOMEN - 2 VIEW COMPARISON:  04/06/2017 FINDINGS: Dilated small bowel loops with air-fluid levels compatible with small bowel obstruction. No free air organomegaly. Bibasilar atelectasis in the visualized lung bases. IMPRESSION: Dilated small bowel loops with air-fluid levels compatible with small bowel obstruction. Electronically Signed   By: Rolm Baptise M.D.   On: 04/08/2017 10:49   Dg Abd Portable 1v  Result Date: 04/08/2017 CLINICAL DATA:  NG tube placement EXAM: PORTABLE ABDOMEN - 1 VIEW COMPARISON:  04/08/2017, 04/06/2017 FINDINGS: NG tube within the stomach which is collapsed. Dilated small bowel in the mid and left abdomen with loops measuring 4.7 cm in diameter. Findings compatible with small-bowel obstruction. No acute osseous finding. Bibasilar atelectasis. No abnormal calcifications. IMPRESSION: NG tube within the stomach. Persistent small bowel dilatation compatible with obstruction Electronically Signed   By: Jerilynn Mages.  Shick M.D.   On: 04/08/2017 20:39   Dg Abd Portable 2v  Result Date: 04/09/2017 CLINICAL DATA:  Slight increase of mid abdominal pain. EXAM: PORTABLE ABDOMEN - 2 VIEW COMPARISON:  04/08/2017 FINDINGS: The enteric tube tip is in the left upper quadrant consistent with location in the upper stomach. Persistent gaseous distention of small bowel with air-fluid levels consistent with small bowel obstruction. The degree of distention appears to be less than on the previous study. No free intra-abdominal air. No radiopaque stones. Visualized  bones appear intact. IMPRESSION: Gaseous  distention of small bowel with air-fluid levels consistent with small bowel obstruction. Degree of distention appears to be improving since previous study. Electronically Signed   By: Lucienne Capers M.D.   On: 04/09/2017 03:35        Scheduled Meds: . enoxaparin (LOVENOX) injection  40 mg Subcutaneous Q24H  . fentaNYL (SUBLIMAZE) injection  75 mcg Intravenous Once   Continuous Infusions: . dextrose 5% lactated ringers with KCl 20 mEq/L 100 mL/hr at 04/09/17 1022  . famotidine (PEPCID) IV Stopped (04/09/17 1037)  . levofloxacin (LEVAQUIN) IV Stopped (04/08/17 2228)  . metronidazole 500 mg (04/09/17 1221)     LOS: 3 days    Time spent: 30 min    Fayrene Helper, MD [facility] [phone]   If 7PM-7AM, please contact night-coverage [email or web address] Password [email or web address] 04/09/2017, 1:21 PM

## 2017-04-10 ENCOUNTER — Inpatient Hospital Stay (HOSPITAL_COMMUNITY): Payer: BLUE CROSS/BLUE SHIELD

## 2017-04-10 DIAGNOSIS — R1084 Generalized abdominal pain: Secondary | ICD-10-CM

## 2017-04-10 DIAGNOSIS — R935 Abnormal findings on diagnostic imaging of other abdominal regions, including retroperitoneum: Secondary | ICD-10-CM

## 2017-04-10 DIAGNOSIS — R109 Unspecified abdominal pain: Secondary | ICD-10-CM

## 2017-04-10 LAB — CBC WITH DIFFERENTIAL/PLATELET
BASOS PCT: 0 %
Basophils Absolute: 0 10*3/uL (ref 0.0–0.1)
EOS ABS: 0.1 10*3/uL (ref 0.0–0.7)
Eosinophils Relative: 1 %
HEMATOCRIT: 36.9 % — AB (ref 39.0–52.0)
HEMOGLOBIN: 12.7 g/dL — AB (ref 13.0–17.0)
LYMPHS ABS: 1.7 10*3/uL (ref 0.7–4.0)
Lymphocytes Relative: 24 %
MCH: 31.4 pg (ref 26.0–34.0)
MCHC: 34.4 g/dL (ref 30.0–36.0)
MCV: 91.1 fL (ref 78.0–100.0)
Monocytes Absolute: 0.8 10*3/uL (ref 0.1–1.0)
Monocytes Relative: 11 %
NEUTROS ABS: 4.5 10*3/uL (ref 1.7–7.7)
NEUTROS PCT: 64 %
Platelets: 192 10*3/uL (ref 150–400)
RBC: 4.05 MIL/uL — AB (ref 4.22–5.81)
RDW: 13 % (ref 11.5–15.5)
WBC: 7.1 10*3/uL (ref 4.0–10.5)

## 2017-04-10 LAB — GASTROINTESTINAL PANEL BY PCR, STOOL (REPLACES STOOL CULTURE)
ASTROVIRUS: NOT DETECTED
Adenovirus F40/41: NOT DETECTED
CAMPYLOBACTER SPECIES: NOT DETECTED
CYCLOSPORA CAYETANENSIS: NOT DETECTED
Cryptosporidium: NOT DETECTED
ENTEROTOXIGENIC E COLI (ETEC): NOT DETECTED
Entamoeba histolytica: NOT DETECTED
Enteroaggregative E coli (EAEC): NOT DETECTED
Enteropathogenic E coli (EPEC): NOT DETECTED
Giardia lamblia: NOT DETECTED
NOROVIRUS GI/GII: NOT DETECTED
PLESIMONAS SHIGELLOIDES: NOT DETECTED
Rotavirus A: NOT DETECTED
SALMONELLA SPECIES: NOT DETECTED
SAPOVIRUS (I, II, IV, AND V): NOT DETECTED
SHIGA LIKE TOXIN PRODUCING E COLI (STEC): NOT DETECTED
SHIGELLA/ENTEROINVASIVE E COLI (EIEC): NOT DETECTED
VIBRIO SPECIES: NOT DETECTED
Vibrio cholerae: NOT DETECTED
Yersinia enterocolitica: NOT DETECTED

## 2017-04-10 LAB — BASIC METABOLIC PANEL
Anion gap: 10 (ref 5–15)
BUN: 14 mg/dL (ref 6–20)
CHLORIDE: 110 mmol/L (ref 101–111)
CO2: 22 mmol/L (ref 22–32)
Calcium: 8.2 mg/dL — ABNORMAL LOW (ref 8.9–10.3)
Creatinine, Ser: 0.68 mg/dL (ref 0.61–1.24)
GFR calc non Af Amer: >60 mL/min (ref >60)
Glucose, Bld: 100 mg/dL — ABNORMAL HIGH (ref 65–99)
POTASSIUM: 3.3 mmol/L — AB (ref 3.5–5.1)
SODIUM: 142 mmol/L (ref 135–145)

## 2017-04-10 LAB — GLUCOSE, CAPILLARY
GLUCOSE-CAPILLARY: 101 mg/dL — AB (ref 65–99)
GLUCOSE-CAPILLARY: 129 mg/dL — AB (ref 65–99)
GLUCOSE-CAPILLARY: 90 mg/dL (ref 65–99)
GLUCOSE-CAPILLARY: 96 mg/dL (ref 65–99)

## 2017-04-10 MED ORDER — IOPAMIDOL (ISOVUE-300) INJECTION 61%
100.0000 mL | Freq: Once | INTRAVENOUS | Status: AC | PRN
  Administered 2017-04-10: 100 mL via INTRAVENOUS

## 2017-04-10 MED ORDER — IOPAMIDOL (ISOVUE-300) INJECTION 61%
30.0000 mL | Freq: Once | INTRAVENOUS | Status: AC | PRN
  Administered 2017-04-10: 15 mL via ORAL

## 2017-04-10 MED ORDER — PROMETHAZINE HCL 25 MG/ML IJ SOLN
12.5000 mg | Freq: Once | INTRAMUSCULAR | Status: AC
  Administered 2017-04-10: 12.5 mg via INTRAVENOUS
  Filled 2017-04-10: qty 1

## 2017-04-10 MED ORDER — IOPAMIDOL (ISOVUE-300) INJECTION 61%
INTRAVENOUS | Status: AC
  Filled 2017-04-10: qty 30

## 2017-04-10 MED ORDER — IOPAMIDOL (ISOVUE-300) INJECTION 61%
INTRAVENOUS | Status: AC
  Filled 2017-04-10: qty 100

## 2017-04-10 NOTE — Progress Notes (Signed)
PROGRESS NOTE    [name]  QHU:[id number] DOB: 05/25/1981 DOA: 04/05/2017 PCP: Susy Frizzle, MD (Confirm with patient/family/NH records and if not entered, this HAS to be entered at [facility] point of entry. "No PCP" if truly none.)   Brief Narrative: (Start on day 1 of progress note - keep it brief and live) Ruben Reedis a 36 yo male with history of recurrent pelvic abscess and perforated viscus s/p ileocecectomyin early 2018. He presented to the ED with chief complaint of abdominal pain, nausea, vomiting without diarrhea. In the ER,CT of the abdomen and pelvis showedinflammation of the small bowel with possible ileus, colitis. No definite abscess. He was febrile, tachycardic with leukocytosis and lactic acidosis of 4.3.Patient was started on empiric antibiotics, admitted for further treatment.   Assessment & Plan:   Principal Problem:   Sepsis (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  - stable H/H - pepcid  Hypokalemia -Replace, trend - k in fluids    DVT prophylaxis: lovenox Code Status: full  Family Communication: none in room  Disposition Plan: pending improvement   Consultants:   surgery  Procedures: (Don't include imaging studies which can be auto populated. Include things that cannot be auto  populated i.e. Echo, Carotid and venous dopplers, Foley, Bipap, HD, tubes/drains, wound vac, central lines etc)  none  Antimicrobials: (specify start and planned stop date. Auto populated tables are space occupying and do not give end dates) Anti-infectives    Start     Dose/Rate Route Frequency Ordered Stop   04/06/17 2200  levofloxacin (LEVAQUIN) IVPB 750 mg     750 mg 100 mL/hr over 90 Minutes Intravenous Every 24 hours 04/06/17 0509     04/06/17 1030  metroNIDAZOLE (FLAGYL) IVPB 500 mg     500 mg 100 mL/hr over 60 Minutes Intravenous Every 8 hours 04/06/17 1006     04/06/17 0600  aztreonam (AZACTAM) 2 g in dextrose 5 % 50 mL IVPB  Status:  Discontinued     2 g 100 mL/hr over 30 Minutes Intravenous Every 8 hours 04/06/17 0509 04/06/17 1003   04/06/17 0600  vancomycin (VANCOCIN) IVPB 1000 mg/200 mL premix  Status:  Discontinued     1,000 mg 200 mL/hr over 60 Minutes Intravenous Every 8 hours 04/06/17 0509 04/06/17 1004   04/06/17 0030  vancomycin (VANCOCIN) IVPB 1000 mg/200 mL premix     1,000 mg 200 mL/hr over 60 Minutes Intravenous  Once 04/06/17 0019 04/06/17 0208   04/06/17 0030  aztreonam (AZACTAM) 2 g in dextrose 5 % 50 mL IVPB     2 g 100 mL/hr over 30 Minutes Intravenous  Once 04/06/17 0019 04/06/17 0208   04/06/17 0030  levofloxacin (LEVAQUIN) IVPB 750 mg     750 mg 100 mL/hr over 90 Minutes  Intravenous  Once 04/06/17 0019 04/06/17 0208          Subjective: Feeling better. Loose stools today.  Objective: Vitals:   04/09/17 0436 04/09/17 1215 04/09/17 2116 04/10/17 0558  BP: 122/66 135/72 136/82 135/85  Pulse: 91 88 93 94  Resp: _0 Temp: 98.4 F (36.9 C) 99 F (37.2 C) 98.6 F (37 C) 98.6 F (37 C)  TempSrc: Oral Oral Axillary Oral  SpO2: 93% 94% 99% 98%  Weight:      Height:        Intake/Output Summary (Last 24 hours) at 04/10/17 1155 Last data filed at 04/10/17 0807  Gross per 24 hour  Intake          2413.33 ml  Output              1379 ml  Net          1034.33 ml   Filed Weights   04/05/17 2033 04/06/17 0338  Weight: 106.6 kg (235 lb) 114 kg (251 lb 5.2 oz)    Examination:  General: No acute distress. Cardiovascular: Heart sounds show a regular rate, and rhythm. No gallops or rubs. No murmurs. No JVD. Lungs: Clear to auscultation bilaterally with good air movement. No rales, rhonchi or wheezes. Abdomen: Soft, nontender, nondistended . queit bowel sounds. No masses. No hepatosplenomegaly. Neurological: Alert and oriented 3. Moves all extremities 4 with equal strength. Cranial nerves II through XII grossly intact. Skin: Warm and dry. No rashes or lesions. Extremities: No clubbing or cyanosis. No edema. Pedal pulses 2+. Psychiatric: Mood and affect are normal. Insight and judgment are appropriate.    Data Reviewed: I have personally reviewed following labs and imaging studies  CBC:  Recent Labs Lab 04/06/17 0428 04/07/17 0444 04/08/17 0422 04/09/17 0246 04/10/17 0418  WBC 14.6* 12.4* 11.4* 7.9 7.1  NEUTROABS 12.3* 10.4* 9.8* 5.8 4.5  HGB 14.2 13.6 13.6 12.8* 12.7*  HCT 39.9 39.1 39.1 36.7* 36.9*  MCV 87.1 90.3 90.1 90.8 91.1  PLT 147* 143* 146* 179 443   Basic Metabolic Panel:  Recent Labs Lab 04/06/17 0428 04/07/17 0444 04/08/17 0422 04/09/17 0246 04/10/17 0418  NA 137 138 140 141 142  K 3.8 3.3* 3.5 3.3* 3.3*  CL 107 108 109 111 110  CO2 22 22 20* 21* 22  GLUCOSE 109* 101* 111* 92 100*  BUN _1 CREATININE 0.89 0.77 0.79 0.78 0.68  CALCIUM 7.8* 8.3* 8.5* 8.1* 8.2*  MG  --   --   --  1.7  --    GFR: Estimated Creatinine Clearance: 171.3 mL/min (by C-G formula based on SCr of 0.68 mg/dL). Liver Function Tests:  Recent Labs Lab 04/05/17 2125 04/06/17 0428 04/07/17 0444 04/08/17 0422  AST 34 _2 ALT 68* 46 34 27  ALKPHOS 78 48 51 50  BILITOT 2.6* 2.4* 4.5* 3.3*  PROT 7.7 5.7* 5.7* 5.8*  ALBUMIN 5.0 3.4* 3.4* 3.2*    Recent Labs Lab 04/05/17 2125    LIPASE 28   No results for input(s): AMMONIA in the last 168 hours. Coagulation Profile:  Recent Labs Lab 04/06/17 0428  INR 1.15   Cardiac Enzymes: No results for input(s): CKTOTAL, CKMB, CKMBINDEX, TROPONINI in the last 168 hours. BNP (last 3 results) No results for input(s): PROBNP in the last 8760 hours. HbA1C: No results for input(s): HGBA1C in the last 72 hours. CBG:  Recent Labs Lab 04/09/17 0017 04/09/17 1208 04/09/17  2210 04/09/17 2329 04/10/17 0805  GLUCAP 95 92 91 96 129*   Lipid Profile: No results for input(s): CHOL, HDL, LDLCALC, TRIG, CHOLHDL, LDLDIRECT in the last 72 hours. Thyroid Function Tests: No results for input(s): TSH, T4TOTAL, FREET4, T3FREE, THYROIDAB in the last 72 hours. Anemia Panel: No results for input(s): VITAMINB12, FOLATE, FERRITIN, TIBC, IRON, RETICCTPCT in the last 72 hours. Sepsis Labs:  Recent Labs Lab 04/06/17 0015 04/06/17 0115 04/06/17 0428 04/06/17 0800  PROCALCITON  --   --  0.57  --   LATICACIDVEN 4.04* 4.3* 2.5* 1.8  1.8    Recent Results (from the past 240 hour(s))  Blood Culture (routine x 2)     Status: None (Preliminary result)   Collection Time: 04/06/17 12:10 AM  Result Value Ref Range Status   Specimen Description BLOOD RIGHT HAND  Final   Special Requests   Final    BOTTLES DRAWN AEROBIC AND ANAEROBIC Blood Culture adequate volume   Culture   Final    NO GROWTH 3 DAYS Performed at [facility], [place] [address], [place], [place] [address]    Report Status PENDING  Incomplete  Blood Culture (routine x 2)     Status: None (Preliminary result)   Collection Time: 04/06/17 12:23 AM  Result Value Ref Range Status   Specimen Description BLOOD LEFT FOREARM  Final   Special Requests IN PEDIATRIC BOTTLE Blood Culture adequate volume  Final   Culture   Final    NO GROWTH 3 DAYS Performed at [facility], [place] [address], [place], [place] [address]    Report Status PENDING  Incomplete  C difficile  quick scan w PCR reflex     Status: None   Collection Time: 04/08/17  8:24 AM  Result Value Ref Range Status   C Diff antigen NEGATIVE NEGATIVE Final   C Diff toxin NEGATIVE NEGATIVE Final   C Diff interpretation No C. difficile detected.  Final         Radiology Studies: Dg Abd Portable 1v  Result Date: 04/10/2017 CLINICAL DATA:  NG tube placement EXAM: PORTABLE ABDOMEN - 1 VIEW COMPARISON:  04/09/2017 FINDINGS: Enteric tube tip is in the left upper quadrant consistent with location in the upper stomach. Proximal side hole projects just distal to the EG junction. Visualized bowel gas pattern is normal. IMPRESSION: Enteric tube tip is in the left upper quadrant consistent with location in the upper stomach. Electronically Signed   By: Lucienne Capers M.D.   On: 04/10/2017 02:36   Dg Abd Portable 1v  Result Date: 04/08/2017 CLINICAL DATA:  NG tube placement EXAM: PORTABLE ABDOMEN - 1 VIEW COMPARISON:  04/08/2017, 04/06/2017 FINDINGS: NG tube within the stomach which is collapsed. Dilated small bowel in the mid and left abdomen with loops measuring 4.7 cm in diameter. Findings compatible with small-bowel obstruction. No acute osseous finding. Bibasilar atelectasis. No abnormal calcifications. IMPRESSION: NG tube within the stomach. Persistent small bowel dilatation compatible with obstruction Electronically Signed   By: Jerilynn Mages.  Shick M.D.   On: 04/08/2017 20:39   Dg Abd Portable 2v  Result Date: 04/09/2017 CLINICAL DATA:  Slight increase of mid abdominal pain. EXAM: PORTABLE ABDOMEN - 2 VIEW COMPARISON:  04/08/2017 FINDINGS: The enteric tube tip is in the left upper quadrant consistent with location in the upper stomach. Persistent gaseous distention of small bowel with air-fluid levels consistent with small bowel obstruction. The degree of distention appears to be less than on the previous study. No  free intra-abdominal air. No radiopaque stones. Visualized bones appear intact. IMPRESSION: Gaseous  distention of small bowel with air-fluid levels consistent with small bowel obstruction. Degree of distention appears to be improving since previous study. Electronically Signed   By: Lucienne Capers M.D.   On: 04/09/2017 03:35        Scheduled Meds: . iopamidol      . enoxaparin (LOVENOX) injection  40 mg Subcutaneous Q24H  . fentaNYL (SUBLIMAZE) injection  75 mcg Intravenous Once   Continuous Infusions: . dextrose 5% lactated ringers with KCl 20 mEq/L 100 mL/hr at 04/09/17 1022  . famotidine (PEPCID) IV Stopped (04/10/17 1032)  . levofloxacin (LEVAQUIN) IV Stopped (04/09/17 2330)  . metronidazole 500 mg (04/10/17 1115)     LOS: 4 days    Time spent: 51 min    Fayrene Helper, MD [facility] [phone]   If 7PM-7AM, please contact night-coverage [email or web address] Password [facility] 04/10/2017, 11:55 AM

## 2017-04-10 NOTE — Progress Notes (Signed)
[facility] Progress Note  Subjective:  Feels ok.  Passing some stool and noticed more activity in abdomen earlier today, although quiet now.  Repeat CT scan with contrast has been ordered by surgery.  Is wondering if food allergies could be causing the inflammation; says that eggs really cause him a lot of abdominal distress.    Objective:  Vital signs in last 24 hours: Temp:  [98.6 F (37 C)-99 F (37.2 C)] 98.6 F (37 C) (09/21 0558) Pulse Rate:  [88-94] 94 (09/21 0558) Resp:  [18-20] 18 (09/21 0558) BP: (135-136)/(72-85) 135/85 (09/21 0558) SpO2:  [94 %-99 %] 98 % (09/21 0558) Last BM Date: 04/10/17 General:  Alert, Well-developed, in NAD Heart:  Regular rate and rhythm; no murmurs Pulm:  CTAB.  No increased WOB. Abdomen:  Soft, non-distended.  BS minimal.  Non-tender.  Extremities:  Without edema. Neurologic:  Alert and oriented x 4;  grossly normal neurologically. Psych:  Alert and cooperative. Normal mood and affect.  Intake/Output from previous day: 09/20 0701 - 09/21 0700 In: 2413.3 [I.V.:1963.3; IV Piggyback:450] Out: 1279 [Urine:877; Emesis/NG output:400; Stool:2] Intake/Output this shift: Total I/O In: 0  Out: 100 [Urine:100]  Lab Results:  Recent Labs  04/08/17 0422 04/09/17 0246 04/10/17 0418  WBC 11.4* 7.9 7.1  HGB 13.6 12.8* 12.7*  HCT 39.1 36.7* 36.9*  PLT 146* 179 192   BMET  Recent Labs  04/08/17 0422 04/09/17 0246 04/10/17 0418  NA 140 141 142  K 3.5 3.3* 3.3*  CL 109 111 110  CO2 20* 21* 22  GLUCOSE 111* 92 100*  BUN _0 CREATININE 0.79 0.78 0.68  CALCIUM 8.5* 8.1* 8.2*   LFT  Recent Labs  04/08/17 0422  PROT 5.8*  ALBUMIN 3.2*  AST 15  ALT 27  ALKPHOS 50  BILITOT 3.3*   Dg Abd Portable 1v  Result Date: 04/10/2017 CLINICAL DATA:  NG tube placement EXAM: PORTABLE ABDOMEN - 1 VIEW COMPARISON:  04/09/2017 FINDINGS: Enteric tube tip is in the left upper quadrant consistent with location in the upper  stomach. Proximal side hole projects just distal to the EG junction. Visualized bowel gas pattern is normal. IMPRESSION: Enteric tube tip is in the left upper quadrant consistent with location in the upper stomach. Electronically Signed   By: Lucienne Capers M.D.   On: 04/10/2017 02:36   Dg Abd Portable 1v  Result Date: 04/08/2017 CLINICAL DATA:  NG tube placement EXAM: PORTABLE ABDOMEN - 1 VIEW COMPARISON:  04/08/2017, 04/06/2017 FINDINGS: NG tube within the stomach which is collapsed. Dilated small bowel in the mid and left abdomen with loops measuring 4.7 cm in diameter. Findings compatible with small-bowel obstruction. No acute osseous finding. Bibasilar atelectasis. No abnormal calcifications. IMPRESSION: NG tube within the stomach. Persistent small bowel dilatation compatible with obstruction Electronically Signed   By: Jerilynn Mages.  Shick M.D.   On: 04/08/2017 20:39   Dg Abd Portable 2v  Result Date: 04/09/2017 CLINICAL DATA:  Slight increase of mid abdominal pain. EXAM: PORTABLE ABDOMEN - 2 VIEW COMPARISON:  04/08/2017 FINDINGS: The enteric tube tip is in the left upper quadrant consistent with location in the upper stomach. Persistent gaseous distention of small bowel with air-fluid levels consistent with small bowel obstruction. The degree of distention appears to be less than on the previous study. No free intra-abdominal air. No radiopaque stones. Visualized bones appear intact. IMPRESSION: Gaseous distention of small bowel with air-fluid levels consistent with small bowel obstruction. Degree of distention  appears to be improving since previous study. Electronically Signed   By: Lucienne Capers M.D.   On: 04/09/2017 03:35   Assessment / Plan: *SBO with enteritis and ? Transverse colitis in pt with hx of abdominal abscesses.  S/p 10/16/16 diagnostic laparotomy with drainage of abdominal abscess and ileocecectomy.  Pathology did not reveal signs of Crohns.  However the clinical hx and picture, elevated  CRP and ESR are certainly suspicious for IBD/Crohns.  Initially felt to have developed perforation after eating fragments of pork bone.  I think that is going to be difficult to convince him of a Crohn's diagnosis; he tries to come up with everything else possible that could be causing these problems.  *CG gastric material, occult blood +.  ? NGT trauma, ? PUD.  IV Pepcid initiated.     *Hypokalemia:  Being addressed by primary service.  -Plans for repeat CT scan with contrast today per surgery. -From GI standpoint will need eventual colonoscopy.   LOS: 4 days   ZEHR, JESSICA D.  04/10/2017, 11:38 AM  Pager number [phone]  GI ATTENDING  Interval history data reviewed. Agree with interval progress note as outlined above. Patient for follow-up CT scan. Will need colonoscopy with ileal intubation when he can tolerate prep. GI will continue to follow. Dr. Havery Moros on-call this weekend  Docia Chuck. Geri Seminole., M.D. [facility] Division of Gastroenterology

## 2017-04-10 NOTE — Progress Notes (Signed)
CC:  Abdominal pain  Subjective: He looks and feels better.  Still having pain.  CT is pending.  Objective: Vital signs in last 24 hours: Temp:  [98.6 F (37 C)-99 F (37.2 C)] 98.6 F (37 C) (09/21 0558) Pulse Rate:  [88-94] 94 (09/21 0558) Resp:  [18-20] 18 (09/21 0558) BP: (135-136)/(72-85) 135/85 (09/21 0558) SpO2:  [94 %-99 %] 98 % (09/21 0558) Last BM Date: 04/10/17 2400 IV 877 urine 400 recorded from the NG BM x 2 Afebrile, VSS Labs OK - K+ stable WBC continues to improve C diff is negative GI panel is pending Intake/Output from previous day: 09/20 0701 - 09/21 0700 In: 2413.3 [I.V.:1963.3; IV Piggyback:450] Out: 1279 [Urine:877; Emesis/NG output:400; Stool:2] Intake/Output this shift: Total I/O In: 0  Out: 100 [Urine:100]  General appearance: alert, cooperative and no distress GI: soft, still having some pain but better.  Lab Results:   Recent Labs  04/09/17 0246 04/10/17 0418  WBC 7.9 7.1  HGB 12.8* 12.7*  HCT 36.7* 36.9*  PLT 179 192    BMET  Recent Labs  04/09/17 0246 04/10/17 0418  NA 141 142  K 3.3* 3.3*  CL 111 110  CO2 21* 22  GLUCOSE 92 100*  BUN 15 14  CREATININE 0.78 0.68  CALCIUM 8.1* 8.2*   PT/INR No results for input(s): LABPROT, INR in the last 72 hours.   Recent Labs Lab 04/05/17 2125 04/06/17 0428 04/07/17 0444 04/08/17 0422  AST 34 ALT 68* 46 34 27  ALKPHOS 78 48 51 50  BILITOT 2.6* 2.4* 4.5* 3.3*  PROT 7.7 5.7* 5.7* 5.8*  ALBUMIN 5.0 3.4* 3.4* 3.2*     Lipase     Component Value Date/Time   LIPASE 28 04/05/2017 2125     Medications: . enoxaparin (LOVENOX) injection  40 mg Subcutaneous Q24H  . fentaNYL (SUBLIMAZE) injection  75 mcg Intravenous Once   . dextrose 5% lactated ringers with KCl 20 mEq/L 100 mL/hr at 04/09/17 1022  . famotidine (PEPCID) IV 20 mg (04/10/17 1002)  . levofloxacin (LEVAQUIN) IV Stopped (04/09/17 2330)  . metronidazole Stopped (04/10/17 0330)    Anti-infectives    Start     Dose/Rate Route Frequency Ordered Stop   04/06/17 2200  levofloxacin (LEVAQUIN) IVPB 750 mg     750 mg 100 mL/hr over 90 Minutes Intravenous Every 24 hours 04/06/17 0509     04/06/17 1030  metroNIDAZOLE (FLAGYL) IVPB 500 mg     500 mg 100 mL/hr over 60 Minutes Intravenous Every 8 hours 04/06/17 1006     04/06/17 0600  aztreonam (AZACTAM) 2 g in dextrose 5 % 50 mL IVPB  Status:  Discontinued     2 g 100 mL/hr over 30 Minutes Intravenous Every 8 hours 04/06/17 0509 04/06/17 1003   04/06/17 0600  vancomycin (VANCOCIN) IVPB 1000 mg/200 mL premix  Status:  Discontinued     1,000 mg 200 mL/hr over 60 Minutes Intravenous Every 8 hours 04/06/17 0509 04/06/17 1004   04/06/17 0030  vancomycin (VANCOCIN) IVPB 1000 mg/200 mL premix     1,000 mg 200 mL/hr over 60 Minutes Intravenous  Once 04/06/17 0019 04/06/17 0208   04/06/17 0030  aztreonam (AZACTAM) 2 g in dextrose 5 % 50 mL IVPB     2 g 100 mL/hr over 30 Minutes Intravenous  Once 04/06/17 0019 04/06/17 0208   04/06/17 0030  levofloxacin (LEVAQUIN) IVPB 750 mg     750 mg 100 mL/hr  over 90 Minutes Intravenous  Once 04/06/17 0019 04/06/17 0208     Assessment/Plan  Abdominal pain, nausea and vomiting with new inflammatory changes RLQ and upper pelvis, possible colitis. 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 Donell Beers Diverticulosis Hepatic steatosis Patella alta - limited mobility BMI 33 FEN: sips and chips/IV fluids ID: Azactam 9/17 =>>day 1, Vancomycin 9/17 x 1, Levofloxacin 9/17 = day 3, Flagyl 9/17 = day 9 DVT: SCD/Lovenox    Plan:  CT today   LOS: 4 days    [name] 04/10/2017 [phone]

## 2017-04-11 DIAGNOSIS — R109 Unspecified abdominal pain: Secondary | ICD-10-CM

## 2017-04-11 DIAGNOSIS — Z980 Intestinal bypass and anastomosis status: Secondary | ICD-10-CM

## 2017-04-11 DIAGNOSIS — K5730 Diverticulosis of large intestine without perforation or abscess without bleeding: Secondary | ICD-10-CM

## 2017-04-11 LAB — CULTURE, BLOOD (ROUTINE X 2)
CULTURE: NO GROWTH
Culture: NO GROWTH
SPECIAL REQUESTS: ADEQUATE
SPECIAL REQUESTS: ADEQUATE

## 2017-04-11 LAB — CBC WITH DIFFERENTIAL/PLATELET
BASOS ABS: 0 10*3/uL (ref 0.0–0.1)
Basophils Relative: 0 %
EOS PCT: 3 %
Eosinophils Absolute: 0.2 10*3/uL (ref 0.0–0.7)
HCT: 36.6 % — ABNORMAL LOW (ref 39.0–52.0)
HEMOGLOBIN: 12.7 g/dL — AB (ref 13.0–17.0)
LYMPHS PCT: 24 %
Lymphs Abs: 1.7 10*3/uL (ref 0.7–4.0)
MCH: 31.1 pg (ref 26.0–34.0)
MCHC: 34.7 g/dL (ref 30.0–36.0)
MCV: 89.5 fL (ref 78.0–100.0)
Monocytes Absolute: 0.9 10*3/uL (ref 0.1–1.0)
Monocytes Relative: 13 %
NEUTROS ABS: 4.1 10*3/uL (ref 1.7–7.7)
NEUTROS PCT: 60 %
PLATELETS: 198 10*3/uL (ref 150–400)
RBC: 4.09 MIL/uL — AB (ref 4.22–5.81)
RDW: 12.7 % (ref 11.5–15.5)
WBC: 6.8 10*3/uL (ref 4.0–10.5)

## 2017-04-11 LAB — BASIC METABOLIC PANEL
ANION GAP: 9 (ref 5–15)
BUN: 13 mg/dL (ref 6–20)
CALCIUM: 8.5 mg/dL — AB (ref 8.9–10.3)
CO2: 22 mmol/L (ref 22–32)
Chloride: 108 mmol/L (ref 101–111)
Creatinine, Ser: 0.66 mg/dL (ref 0.61–1.24)
Glucose, Bld: 94 mg/dL (ref 65–99)
Potassium: 3.3 mmol/L — ABNORMAL LOW (ref 3.5–5.1)
Sodium: 139 mmol/L (ref 135–145)

## 2017-04-11 LAB — GLUCOSE, CAPILLARY
GLUCOSE-CAPILLARY: 113 mg/dL — AB (ref 65–99)
Glucose-Capillary: 106 mg/dL — ABNORMAL HIGH (ref 65–99)

## 2017-04-11 MED ORDER — PROMETHAZINE HCL 25 MG/ML IJ SOLN
12.5000 mg | Freq: Once | INTRAMUSCULAR | Status: AC
  Administered 2017-04-11: 12.5 mg via INTRAVENOUS
  Filled 2017-04-11: qty 1

## 2017-04-11 NOTE — Progress Notes (Signed)
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 He presented to the ED with chief complaint of abdominal pain, nausea, vomiting without diarrhea. In the ER,CT of the abdomen and pelvis showedinflammation of the small bowel with possible ileus, colitis. No definite abscess. He was febrile, tachycardic with leukocytosis and lactic acidosis of 4.3.Patient was started on empiric antibiotics, admitted for further treatment.   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  Need to f/u with surgery and GI.  IR felt not possible for drainage and maybe less likely abscess per GI note.  - NG tube in place, passing stool today - GI consult - Blood culture ngtd - General surgery following - ESR/CRP elevated, neg c diff  _1  GI path panel negative -Empiric levaquin/flagyl -NPO with sips of clears, ice   Hemoccult Positive NG tube output: Possibly due to trauma from NG placement vs inflammation (gastritis, PUD), ctm.  - stable H/H - pepcid  Hypokalemia -Replace, trend - k in fluids    DVT prophylaxis: lovenox Code Status: full  Family Communication: none in room  Disposition Plan: pending improvement   Consultants:   surgery  Procedures: (Don't  include imaging studies which can be auto populated. Include things that cannot be auto populated i.e. Echo, Carotid and venous dopplers, Foley, Bipap, HD, tubes/drains, wound vac, central lines etc)  none  Antimicrobials: (specify start and planned stop date. Auto populated tables are space occupying and do not give end dates) Anti-infectives    Start     Dose/Rate Route Frequency Ordered Stop   04/06/17 2200  levofloxacin (LEVAQUIN) IVPB 750 mg     750 mg 100 mL/hr over 90 Minutes Intravenous Every 24 hours 04/06/17 0509     04/06/17 1030  metroNIDAZOLE (FLAGYL) IVPB 500 mg     500 mg 100 mL/hr over 60 Minutes Intravenous Every 8 hours 04/06/17 1006     04/06/17 0600  aztreonam (AZACTAM) 2 g in dextrose 5 % 50 mL IVPB  Status:  Discontinued     2 g 100 mL/hr over 30 Minutes Intravenous Every 8 hours 04/06/17 0509 04/06/17 1003   04/06/17 0600  vancomycin (VANCOCIN) IVPB 1000 mg/200 mL premix  Status:  Discontinued     1,000 mg 200 mL/hr over 60 Minutes Intravenous Every 8 hours 04/06/17 0509 04/06/17 1004   04/06/17 0030  vancomycin (VANCOCIN) IVPB 1000 mg/200 mL premix     1,000 mg 200 mL/hr over 60 Minutes Intravenous  Once 04/06/17 0019 04/06/17 0208   04/06/17 0030  aztreonam (AZACTAM) 2 g in dextrose 5 % 50 mL IVPB     2 g 100 mL/hr over 30 Minutes Intravenous  Once 04/06/17 0019 04/06/17 0208   04/06/17 0030  levofloxacin (LEVAQUIN) IVPB 750 mg     750 mg 100 mL/hr over 90 Minutes Intravenous  Once 04/06/17 0019 04/06/17 0208          Subjective: Unsure about imaging findings.  Passing stool. Walking in halls.   Objective: Vitals:   04/10/17 0558 04/10/17 1414 04/10/17 2024 04/11/17 0451  BP: 135/85 132/89 127/82 122/68  Pulse: 94 83 71 73  Resp: _0 Temp: 98.6 F (37 C) 98.7 F (37.1 C) 98.7 F (37.1 C) 98.1 F (36.7 C)  TempSrc: Oral Oral Oral Oral  SpO2: 98% 98% 97% 96%  Weight:      Height:        Intake/Output Summary (Last 24 hours) at  04/11/17 1136 Last data filed at 04/11/17 1128  Gross per 24 hour  Intake          2491.67 ml  Output             1150 ml  Net          1341.67 ml   Filed Weights   04/05/17 2033 04/06/17 0338  Weight: 106.6 kg (235 lb) 114 kg (251 lb 5.2 oz)    Examination:  General: No acute distress. Cardiovascular: Heart sounds show Emrys Mckamie regular rate, and rhythm. No gallops or rubs. No murmurs. No JVD. Lungs: Clear to auscultation bilaterally with good air movement. No rales, rhonchi or wheezes. Abdomen: Soft, nontender, nondistended with normal active bowel sounds. No masses. No hepatosplenomegaly.  NG in place Neurological: Alert and oriented 3. Moves all extremities 4 with equal strength. Cranial nerves II through XII grossly intact. Skin: Warm and dry. No rashes or lesions. Extremities: No clubbing or cyanosis. No edema. Pedal pulses 2+. Psychiatric: Mood and affect are normal. Insight and judgment are appropriate.   Data Reviewed: I have personally reviewed following labs and imaging studies  CBC:  Recent Labs Lab 04/07/17 0444 04/08/17 0422 04/09/17 0246 04/10/17 0418 04/11/17 0411  WBC 12.4* 11.4* 7.9 7.1 6.8  NEUTROABS 10.4* 9.8* 5.8 4.5 4.1  HGB 13.6 13.6 12.8* 12.7* 12.7*  HCT 39.1 39.1 36.7* 36.9* 36.6*  MCV 90.3 90.1 90.8 91.1 89.5  PLT 143* 146* 179 192 175   Basic Metabolic Panel:  Recent Labs Lab 04/06/17 0428 04/07/17 0444 04/08/17 0422 04/09/17 0246 04/10/17 0418  NA 137 138 140 141 142  K 3.8 3.3* 3.5 3.3* 3.3*  CL 107 108 109 111 110  CO2 22 22 20* 21* 22  GLUCOSE 109* 101* 111* 92 100*  BUN _1 CREATININE 0.89 0.77 0.79 0.78 0.68  CALCIUM 7.8* 8.3* 8.5* 8.1* 8.2*  MG  --   --   --  1.7  --    GFR: Estimated Creatinine Clearance: 171.3 mL/min (by C-G formula based on SCr of 0.68 mg/dL). Liver Function Tests:  Recent Labs Lab 04/05/17 2125 04/06/17 0428 04/07/17 0444 04/08/17 0422  AST 34 _2 ALT 68* 46 34 27  ALKPHOS 78  48 51 50  BILITOT 2.6* 2.4* 4.5* 3.3*  PROT 7.7 5.7* 5.7* 5.8*  ALBUMIN 5.0 3.4* 3.4* 3.2*    Recent Labs Lab 04/05/17 2125  LIPASE 28   No results for input(s): AMMONIA in the last 168 hours. Coagulation Profile:  Recent Labs Lab 04/06/17 0428  INR 1.15   Cardiac Enzymes: No results for input(s): CKTOTAL, CKMB, CKMBINDEX, TROPONINI in the last 168 hours. BNP (last 3 results) No results for input(s): PROBNP in  the last 8760 hours. HbA1C: No results for input(s): HGBA1C in the last 72 hours. CBG:  Recent Labs Lab 04/09/17 2329 04/10/17 0805 04/10/17 1646 04/10/17 2339 04/11/17 0829  GLUCAP 96 129* 90 101* 113*   Lipid Profile: No results for input(s): CHOL, HDL, LDLCALC, TRIG, CHOLHDL, LDLDIRECT in the last 72 hours. Thyroid Function Tests: No results for input(s): TSH, T4TOTAL, FREET4, T3FREE, THYROIDAB in the last 72 hours. Anemia Panel: No results for input(s): VITAMINB12, FOLATE, FERRITIN, TIBC, IRON, RETICCTPCT in the last 72 hours. Sepsis Labs:  Recent Labs Lab 04/06/17 0015 04/06/17 0115 04/06/17 0428 04/06/17 0800  PROCALCITON  --   --  0.57  --   LATICACIDVEN 4.04* 4.3* 2.5* 1.8  1.8    Recent Results (from the past 240 hour(s))  Blood Culture (routine x 2)     Status: None   Collection Time: 04/06/17 12:10 AM  Result Value Ref Range Status   Specimen Description BLOOD RIGHT HAND  Final   Special Requests   Final    BOTTLES DRAWN AEROBIC AND ANAEROBIC Blood Culture adequate volume   Culture   Final    NO GROWTH 5 DAYS Performed at [facility], [address] [place], [place] [address]    Report Status 04/11/2017 FINAL  Final  Blood Culture (routine x 2)     Status: None   Collection Time: 04/06/17 12:23 AM  Result Value Ref Range Status   Specimen Description BLOOD LEFT FOREARM  Final   Special Requests IN PEDIATRIC BOTTLE Blood Culture adequate volume  Final   Culture   Final    NO GROWTH 5 DAYS Performed at [facility], [place] [address] [place], [place] [address]    Report Status 04/11/2017 FINAL  Final  C difficile quick scan w PCR reflex     Status: None   Collection Time: 04/08/17  8:24 AM  Result Value Ref Range Status   C Diff antigen NEGATIVE NEGATIVE Final   C Diff toxin NEGATIVE NEGATIVE Final   C Diff interpretation No C. difficile detected.  Final  Gastrointestinal Panel by PCR , Stool     Status: None   Collection Time: 04/09/17  6:39 PM  Result Value Ref Range Status   Campylobacter species NOT DETECTED NOT DETECTED Final   Plesimonas shigelloides NOT DETECTED NOT DETECTED Final   Salmonella species NOT DETECTED NOT DETECTED Final   Yersinia enterocolitica NOT DETECTED NOT DETECTED Final   Vibrio species NOT DETECTED NOT DETECTED Final   Vibrio cholerae NOT DETECTED NOT DETECTED Final   Enteroaggregative E coli (EAEC) NOT DETECTED NOT DETECTED Final   Enteropathogenic E coli (EPEC) NOT DETECTED NOT DETECTED Final   Enterotoxigenic E coli (ETEC) NOT DETECTED NOT DETECTED Final   Shiga like toxin producing E coli (STEC) NOT DETECTED NOT DETECTED Final   Shigella/Enteroinvasive E coli (EIEC) NOT DETECTED NOT DETECTED Final   Cryptosporidium NOT DETECTED NOT DETECTED Final   Cyclospora cayetanensis NOT DETECTED NOT DETECTED Final   Entamoeba histolytica NOT DETECTED NOT DETECTED Final   Giardia lamblia NOT DETECTED NOT DETECTED Final   Adenovirus F40/41 NOT DETECTED NOT DETECTED Final   Astrovirus NOT DETECTED NOT DETECTED Final   Norovirus GI/GII NOT DETECTED NOT DETECTED Final   Rotavirus Dequante Tremaine NOT DETECTED NOT DETECTED Final   Sapovirus (I, II, IV, and V) NOT DETECTED NOT DETECTED Final         Radiology Studies: Ct Abdomen Pelvis W Contrast  Result Date:  04/10/2017 CLINICAL DATA:  Abdominal pain. History of ileocecectomy. Evaluate for gastroenteritis or colitis versus small-bowel obstruction. EXAM: CT ABDOMEN AND PELVIS WITH CONTRAST TECHNIQUE: Multidetector CT imaging of the  abdomen and pelvis was performed using the standard protocol following bolus administration of intravenous contrast. CONTRAST:  ISOVUE-300 IOPAMIDOL (ISOVUE-300) INJECTION 61% COMPARISON:  CT abdomen pelvis - 04/06/2017 ; 12/05/2016; 09/09/2016; 08/20/2016; CT-guided percutaneous drainage catheter placement- 08/23/2016; 08/21/2016 FINDINGS: Lower chest: Limited visualization of the lower thorax demonstrates subsegmental atelectasis/ scar within the bilateral lower lobes. Trace bilateral pleural effusions. No focal airspace opacities. Normal heart size.  No pericardial effusion. Hepatobiliary: Normal hepatic contour. There is diffuse decreased attenuation of the hepatic parenchyma on this postcontrast examination suggestive of hepatic steatosis. There are slightly ill-defined hyperenhancing lesions within the dome of the right lobe of the liver (coronal image 45, series 5) as well as within the central aspect of the will medial segment and 2.5 x 1.2 cm respectively, incompletely characterize though similar to previous examinations and favored to represent perfusional anomaly is versus flash filling hemangiomas. There is increased attenuation within the gallbladder fossa suggestive of either vicarious excretion of contrast (favored) versus biliary sludge. No definitive gallbladder wall thickening. There is Masyn Fullam small amount of fluid about the caudal aspect the right lobe of the liver. Pancreas: Normal appearance of the pancreas Spleen: Normal appearance of the spleen Adrenals/Urinary Tract: There is symmetric enhancement of the bilateral kidneys. No definite renal stones this postcontrast examination. No discrete renal lesions. There is Cayle Thunder minimal amount of grossly symmetric likely body habitus related bilateral perinephric stranding. No urine obstruction. Normal appearance the bilateral adrenal glands. Normal appearance of the urinary bladder given underdistention. Stomach/Bowel: Enteric tubes terminates with the mid  body of the stomach. Stable sequela of prior ileocecectomy. Enteric contrast extensive the level of the cecum, however there is bowel wall thickening involving multiple loops of distal small bowel within the right lower abdominal quadrant (images 66 and 67, series 2). Circumferential bowel wall thickening involving Elgin Carn more proximal segment of small bowel (axial image 54, series 2, coronal image 46, series 5). Extensive colonic diverticulosis. Annet Manukyan diverticuli extends into the caudal most aspect of the inflammatory process within the right lower abdominal quadrant (coronal image 49, series 5). This finding is associated with several tiny foci of adjacent extraluminal air (representative images 69 and 70, series 2) and may communicate with the adjacent serpiginous fluid collection within the root of the abdominal mesenteries measuring approximately 11.6 x 6.6 x 1.1 cm (axial image 59, series 2; coronal image 50, series 5). No pneumoperitoneum, pneumatosis or portal venous gas. Colonic diverticulosis without evidence of diverticulitis. Vascular/Lymphatic: Normal caliber of the abdominal aorta. The major branch vessels of the abdominal aorta appear patent on this non CTA examination. Scattered retroperitoneal and mesenteric lymph nodes are numerous though individually not enlarged by size criteria with index mesenteric lymph node within the caudal root of the abdominal mesenteric measuring approximately 0.9 cm in greatest short axis diameter (image 53, series 2). No bulky retroperitoneal, mesenteric, pelvic or inguinal lymphadenopathy. Reproductive: Normal appearance of the prostate gland. No free fluid in the pelvic cul-de-sac. Other: Well-healed midline abdominal incision without evidence of hernia. Musculoskeletal: No acute or aggressive osseous abnormalities. 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 No evidence of sacroiliitis. IMPRESSION: 1. Multiple previous  examinations have been reviewed and I am concerned the patient has experienced Irasema Chalk recurrent episode of acute diverticulitis secondary to Mayrin Schmuck small diverticulum  arising from the anterior aspect of the sigmoid colon. This small diverticulum is associated with several small foci of pneumoperitoneum and appears to contain Jamario Colina serpiginous communication to Masin Shatto developing suspected abscess within the root of the abdominal mesentery measuring approximately 11.6 cm in diameter. 2. Interval progression of circumferential bowel wall thickening involving several loops of mid and distal small bowel, not resulting in enteric obstruction - while potentially secondary to Michael Ventresca concomitant inflammatory etiology (such as Crohn's colitis), I favor these loops are secondarily involved due to their proximity to the suspected developing abscess within the root of the abdominal mesentery. 3. Findings suggestive of hepatic steatosis. 4. Grossly unchanged appearance of hyperenhancing hepatic lesions, incompletely characterized though favored to represent perfusional anomalies versus flash filling hemangiomas, similar previous examinations. 5. Increased attenuation of the intraluminal portion of the gallbladder favored to represent vicarious excretion of contrast from recent contrast-enhanced abdominal CT performed 04/06/2017. Critical Value/emergent results were called by telephone at the time of interpretation on 04/10/2017 at 3:59 pm toBrooke, CCS PA, who verbally acknowledged these results. Electronically Signed   By: Sandi Mariscal M.D.   On: 04/10/2017 16:42   Dg Abd Portable 1v  Result Date: 04/10/2017 CLINICAL DATA:  NG tube placement EXAM: PORTABLE ABDOMEN - 1 VIEW COMPARISON:  04/09/2017 FINDINGS: Enteric tube tip is in the left upper quadrant consistent with location in the upper stomach. Proximal side hole projects just distal to the EG junction. Visualized bowel gas pattern is normal. IMPRESSION: Enteric tube tip is in the left upper  quadrant consistent with location in the upper stomach. Electronically Signed   By: Lucienne Capers M.D.   On: 04/10/2017 02:36        Scheduled Meds: . enoxaparin (LOVENOX) injection  40 mg Subcutaneous Q24H  . fentaNYL (SUBLIMAZE) injection  75 mcg Intravenous Once   Continuous Infusions: . dextrose 5% lactated ringers with KCl 20 mEq/L 100 mL/hr at 04/11/17 1034  . famotidine (PEPCID) IV 20 mg (04/11/17 1128)  . levofloxacin (LEVAQUIN) IV Stopped (04/10/17 2336)  . metronidazole 500 mg (04/11/17 1030)     LOS: 5 days    Time spent: 30 min    Fayrene Helper, MD [facility] [phone]   If 7PM-7AM, please contact night-coverage [email or web address] Password [facility] 04/11/2017, 11:36 AM

## 2017-04-11 NOTE — Progress Notes (Addendum)
Progress Note   Subjective  Patient had CT yesterday as below. He has some LLQ pain. No fever. Not passing much gas. NG in place.   Objective   Vital signs in last 24 hours: Temp:  [98.1 F (36.7 C)-98.7 F (37.1 C)] 98.1 F (36.7 C) (09/22 0451) Pulse Rate:  [71-83] 73 (09/22 0451) Resp:  [17-Ruben] 18 (09/22 0451) BP: (122-132)/(68-89) 122/68 (09/22 0451) SpO2:  [96 %-98 %] 96 % (09/22 0451) Last BM Date: 04/11/17 General:    white Rogers in NAD, NG in place Heart:  Regular rate and rhythm; no murmurs Lungs: Respirations even and unlabored, lungs CTA bilaterally Abdomen:  Soft, LLQ pain with palpation, mildly distended.  Extremities:  Without edema. Neurologic:  Alert and oriented,  grossly normal neurologically. Psych:  Cooperative. Normal mood and affect.  Intake/Output from previous day: 09/21 0701 - 09/22 0700 In: 2866.7 [I.V.:2316.7; IV Piggyback:550] Out: 1250 [Urine:650; Emesis/NG output:600] Intake/Output this shift: No intake/output data recorded.  Lab Results:  Recent Labs  09/Ruben/18 0246 04/10/17 0418 04/11/17 0411  WBC 7.9 7.1 6.8  HGB 12.8* 12.7* 12.7*  HCT 36.7* 36.9* 36.6*  PLT 179 192 198   BMET  Recent Labs  09/Ruben/18 0246 04/10/17 0418  NA 141 142  K 3.3* 3.3*  CL 111 110  CO2 21* 22  GLUCOSE 92 100*  BUN 15 14  CREATININE 0.78 0.68  CALCIUM 8.1* 8.2*   LFT No results for input(s): PROT, ALBUMIN, AST, ALT, ALKPHOS, BILITOT, BILIDIR, IBILI in the last 72 hours. PT/INR No results for input(s): LABPROT, INR in the last 72 hours.  Studies/Results: Ct Abdomen Pelvis W Contrast  Result Date: 04/10/2017 CLINICAL DATA:  Abdominal pain. History of ileocecectomy. Evaluate for gastroenteritis or colitis versus small-bowel obstruction. EXAM: CT ABDOMEN AND PELVIS WITH CONTRAST TECHNIQUE: Multidetector CT imaging of the abdomen and pelvis was performed using the standard protocol following bolus administration of intravenous contrast.  CONTRAST:  ISOVUE-300 IOPAMIDOL (ISOVUE-300) INJECTION 61% COMPARISON:  CT abdomen pelvis - 04/06/2017 ; 12/05/2016; 02/Ruben/2018; 08/20/2016; CT-guided percutaneous drainage catheter placement- 08/23/2016; 08/21/2016 FINDINGS: Lower chest: Limited visualization of the lower thorax demonstrates subsegmental atelectasis/ scar within the bilateral lower lobes. Trace bilateral pleural effusions. No focal airspace opacities. Normal heart size.  No pericardial effusion. Hepatobiliary: Normal hepatic contour. There is diffuse decreased attenuation of the hepatic parenchyma on this postcontrast examination suggestive of hepatic steatosis. There are slightly ill-defined hyperenhancing lesions within the dome of the right lobe of the liver (coronal image 45, series 5) as well as within the central aspect of the will medial segment and 2.5 x 1.2 cm respectively, incompletely characterize though similar to previous examinations and favored to represent perfusional anomaly is versus flash filling hemangiomas. There is increased attenuation within the gallbladder fossa suggestive of either vicarious excretion of contrast (favored) versus biliary sludge. No definitive gallbladder wall thickening. There is a small amount of fluid about the caudal aspect the right lobe of the liver. Pancreas: Normal appearance of the pancreas Spleen: Normal appearance of the spleen Adrenals/Urinary Tract: There is symmetric enhancement of the bilateral kidneys. No definite renal stones this postcontrast examination. No discrete renal lesions. There is a minimal amount of grossly symmetric likely body habitus related bilateral perinephric stranding. No urine obstruction. Normal appearance the bilateral adrenal glands. Normal appearance of the urinary bladder given underdistention. Stomach/Bowel: Enteric tubes terminates with the mid body of the stomach. Stable sequela of prior ileocecectomy. Enteric contrast extensive the level of the  cecum, however  there is bowel wall thickening involving multiple loops of distal small bowel within the right lower abdominal quadrant (images 66 and 67, series 2). Circumferential bowel wall thickening involving a more proximal segment of small bowel (axial image 54, series 2, coronal image 46, series 5). Extensive colonic diverticulosis. A diverticuli extends into the caudal most aspect of the inflammatory process within the right lower abdominal quadrant (coronal image 49, series 5). This finding is associated with several tiny foci of adjacent extraluminal air (representative images 69 and 70, series 2) and may communicate with the adjacent serpiginous fluid collection within the root of the abdominal mesenteries measuring approximately 11.6 x 6.6 x 1.1 cm (axial image 59, series 2; coronal image 50, series 5). No pneumoperitoneum, pneumatosis or portal venous gas. Colonic diverticulosis without evidence of diverticulitis. Vascular/Lymphatic: Normal caliber of the abdominal aorta. The major branch vessels of the abdominal aorta appear patent on this non CTA examination. Scattered retroperitoneal and mesenteric lymph nodes are numerous though individually not enlarged by size criteria with index mesenteric lymph node within the caudal root of the abdominal mesenteric measuring approximately 0.9 cm in greatest short axis diameter (image 53, series 2). No bulky retroperitoneal, mesenteric, pelvic or inguinal lymphadenopathy. Reproductive: Normal appearance of the prostate gland. No free fluid in the pelvic cul-de-sac. Other: Well-healed midline abdominal incision without evidence of hernia. Musculoskeletal: No acute or aggressive osseous abnormalities. 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 No evidence of sacroiliitis. IMPRESSION: 1. Multiple previous examinations have been reviewed and I am concerned the patient has experienced a recurrent episode of acute  diverticulitis secondary to a small diverticulum arising from the anterior aspect of the sigmoid colon. This small diverticulum is associated with several small foci of pneumoperitoneum and appears to contain a serpiginous communication to a developing suspected abscess within the root of the abdominal mesentery measuring approximately 11.6 cm in diameter. 2. Interval progression of circumferential bowel wall thickening involving several loops of mid and distal small bowel, not resulting in enteric obstruction - while potentially secondary to a concomitant inflammatory etiology (such as Crohn's colitis), I favor these loops are secondarily involved due to their proximity to the suspected developing abscess within the root of the abdominal mesentery. 3. Findings suggestive of hepatic steatosis. 4. Grossly unchanged appearance of hyperenhancing hepatic lesions, incompletely characterized though favored to represent perfusional anomalies versus flash filling hemangiomas, similar previous examinations. 5. Increased attenuation of the intraluminal portion of the gallbladder favored to represent vicarious excretion of contrast from recent contrast-enhanced abdominal CT performed 04/06/2017. Critical Value/emergent results were called by telephone at the time of interpretation on 04/10/2017 at 3:59 pm toBrooke, CCS PA, who verbally acknowledged these results. Electronically Signed   By: Simonne Come M.D.   On: 04/10/2017 16:42   Dg Abd Portable 1v  Result Date: 04/10/2017 CLINICAL DATA:  NG tube placement EXAM: PORTABLE ABDOMEN - 1 VIEW COMPARISON:  09/Ruben/2018 FINDINGS: Enteric tube tip is in the left upper quadrant consistent with location in the upper stomach. Proximal side hole projects just distal to the EG junction. Visualized bowel gas pattern is normal. IMPRESSION: Enteric tube tip is in the left upper quadrant consistent with location in the upper stomach. Electronically Signed   By: Burman Nieves M.D.   On:  04/10/2017 02:36       Assessment / Plan:   36 y/o Rogers with history of SBO, enteritis with abdominal abscesses s/p ileocecectomy 10/16/16 with  pathology not consistent with Crohns (foreign body reaction?). Re-admitted with abdominal pain, initial CT concerning for enteritis / ileus. Repeat CT scan yesterday raises the question of possible sigmoid diverticulitis with suspected fluid collection / abscess roughly 11cm in diameter, along with thickening of the small bowel. Patient does have a history of sigmoid diverticulitis noted on prior CT scan in May.  Unclear if small bowel thickening is secondary and reactive to reported sigmoid diverticulitis with associated abscess, versus primary Crohn's disease / IBD. Radiology report favors the former, although in discussion with IR today, they feel perhaps less likely this is an abscess and Crohns may be more likely. Colonoscopy will be useful to evaluate for Crohn's at some point in time when the patient can tolerate a bowel prep, although will hold off for now in the setting of possible diverticulitis. For now continue bowel rest / antibiotics, normal WBC is reassuring. I will discuss his case and CT findings with surgery in regards to management options. Have spoken with IR to see if fluid collection / abscess is amenable to perc drainage, and they do not feel it is possible via IR approach at this time, and further feel this may less likely be an abscess.  Ileene Patrick, MD [facility] Pager [phone]

## 2017-04-11 NOTE — Progress Notes (Signed)
[facility]:  [phone] General Surgery Progress Note   LOS: 5 days  POD -     Chief Complaint: Abdominal pain  Assessment and Plan: 1.  Recurrent abdominal abscess/fluid (admitted 04/05/2017)  Discussed with Dr. Adela Lank.  IR does not think that the fluid is amendable to IR drainage and they think that fluid is probably not abscess.    WBC - 6,800 - 04/11/2017  Flagyl/Levaquin  Does not have SBO - but afraid of NGT being replaced.  Will clamp NGT.  Allow some fluids form the floor.  Repeat KUB and labs in AM.   2.  Odd story - went through some type of small bowel perforation and resection in February, 2018  Says that he did not really feel like he was better until July.  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 Donell Beers  3.  DVT prophylaxis - Lovenox   Principal Problem:   Sepsis (HCC) Active Problems:   Colitis   Abnormal CT of the abdomen   Abdominal pain  Subjective:  Tender in left mid abdomen - mainly when I hit the area on PE.  Had BM.  Choking some on NGT.  Objective:   Vitals:   04/10/17 2024 04/11/17 0451  BP: 127/82 122/68  Pulse: 71 73  Resp: 20 18  Temp: 98.7 F (37.1 C) 98.1 F (36.7 C)  SpO2: 97% 96%     Intake/Output from previous day:  09/21 0701 - 09/22 0700 In: 2866.7 [I.V.:2316.7; IV Piggyback:550] Out: 1250 [Urine:650; Emesis/NG output:600]  Intake/Output this shift:  Total I/O In: 150 [IV Piggyback:150] Out: -    Physical Exam:   General: WN WM who is alert and oriented.    HEENT: Normal. Pupils equal. .   Lungs: Clear   Abdomen: Soft, no tenderness except point tenderness in left mid abdomen.   No guarding or rebound.   Lab Results:    Recent Labs  04/10/17 0418 04/11/17 0411  WBC 7.1 6.8  HGB 12.7* 12.7*  HCT 36.9* 36.6*  PLT 192 198     BMET   Recent Labs  04/09/17 0246 04/10/17 0418  NA 141 142  K 3.3* 3.3*  CL 111 110  CO2 21* 22  GLUCOSE 92 100*  BUN 15 14  CREATININE 0.78 0.68  CALCIUM 8.1* 8.2*    PT/INR  No results for input(s): LABPROT, INR in the last 72 hours.  ABG  No results for input(s): PHART, HCO3 in the last 72 hours.  Invalid input(s): PCO2, PO2   Studies/Results:  Ct Abdomen Pelvis W Contrast  Result Date: 04/10/2017 CLINICAL DATA:  Abdominal pain. History of ileocecectomy. Evaluate for gastroenteritis or colitis versus small-bowel obstruction. EXAM: CT ABDOMEN AND PELVIS WITH CONTRAST TECHNIQUE: Multidetector CT imaging of the abdomen and pelvis was performed using the standard protocol following bolus administration of intravenous contrast. CONTRAST:  ISOVUE-300 IOPAMIDOL (ISOVUE-300) INJECTION 61% COMPARISON:  CT abdomen pelvis - 04/06/2017 ; 12/05/2016; 09/09/2016; 08/20/2016; CT-guided percutaneous drainage catheter placement- 08/23/2016; 08/21/2016 FINDINGS: Lower chest: Limited visualization of the lower thorax demonstrates subsegmental atelectasis/ scar within the bilateral lower lobes. Trace bilateral pleural effusions. No focal airspace opacities. Normal heart size.  No pericardial effusion. Hepatobiliary: Normal hepatic contour. There is diffuse decreased attenuation of the hepatic parenchyma on this postcontrast examination suggestive of hepatic steatosis. There are slightly ill-defined hyperenhancing  lesions within the dome of the right lobe of the liver (coronal image 45, series 5) as well as within the central aspect of the will medial segment and 2.5 x 1.2 cm respectively, incompletely characterize though similar to previous examinations and favored to represent perfusional anomaly is versus flash filling hemangiomas. There is increased attenuation within the gallbladder fossa suggestive of either vicarious excretion of contrast (favored) versus biliary sludge. No definitive gallbladder  wall thickening. There is a small amount of fluid about the caudal aspect the right lobe of the liver. Pancreas: Normal appearance of the pancreas Spleen: Normal appearance of the spleen Adrenals/Urinary Tract: There is symmetric enhancement of the bilateral kidneys. No definite renal stones this postcontrast examination. No discrete renal lesions. There is a minimal amount of grossly symmetric likely body habitus related bilateral perinephric stranding. No urine obstruction. Normal appearance the bilateral adrenal glands. Normal appearance of the urinary bladder given underdistention. Stomach/Bowel: Enteric tubes terminates with the mid body of the stomach. Stable sequela of prior ileocecectomy. Enteric contrast extensive the level of the cecum, however there is bowel wall thickening involving multiple loops of distal small bowel within the right lower abdominal quadrant (images 66 and 67, series 2). Circumferential bowel wall thickening involving a more proximal segment of small bowel (axial image 54, series 2, coronal image 46, series 5). Extensive colonic diverticulosis. A diverticuli extends into the caudal most aspect of the inflammatory process within the right lower abdominal quadrant (coronal image 49, series 5). This finding is associated with several tiny foci of adjacent extraluminal air (representative images 69 and 70, series 2) and may communicate with the adjacent serpiginous fluid collection within the root of the abdominal mesenteries measuring approximately 11.6 x 6.6 x 1.1 cm (axial image 59, series 2; coronal image 50, series 5). No pneumoperitoneum, pneumatosis or portal venous gas. Colonic diverticulosis without evidence of diverticulitis. Vascular/Lymphatic: Normal caliber of the abdominal aorta. The major branch vessels of the abdominal aorta appear patent on this non CTA examination. Scattered retroperitoneal and mesenteric lymph nodes are numerous though individually not enlarged by size  criteria with index mesenteric lymph node within the caudal root of the abdominal mesenteric measuring approximately 0.9 cm in greatest short axis diameter (image 53, series 2). No bulky retroperitoneal, mesenteric, pelvic or inguinal lymphadenopathy. Reproductive: Normal appearance of the prostate gland. No free fluid in the pelvic cul-de-sac. Other: Well-healed midline abdominal incision without evidence of hernia. Musculoskeletal: No acute or aggressive osseous abnormalities. 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 No evidence of sacroiliitis. IMPRESSION: 1. Multiple previous examinations have been reviewed and I am concerned the patient has experienced a recurrent episode of acute diverticulitis secondary to a small diverticulum arising from the anterior aspect of the sigmoid colon. This small diverticulum is associated with several small foci of pneumoperitoneum and appears to contain a serpiginous communication to a developing suspected abscess within the root of the abdominal mesentery measuring approximately 11.6 cm in diameter. 2. Interval progression of circumferential bowel wall thickening involving several loops of mid and distal small bowel, not resulting in enteric obstruction - while potentially secondary to a concomitant inflammatory etiology (such as Crohn's colitis), I favor these loops are secondarily involved due to their proximity to the suspected developing abscess within the root of the abdominal mesentery. 3. Findings suggestive of hepatic steatosis. 4. Grossly unchanged appearance of hyperenhancing hepatic lesions, incompletely characterized though favored to represent perfusional anomalies versus flash filling hemangiomas, similar  previous examinations. 5. Increased attenuation of the intraluminal portion of the gallbladder favored to represent vicarious excretion of contrast from recent contrast-enhanced abdominal CT performed  04/06/2017. Critical Value/emergent results were called by telephone at the time of interpretation on 04/10/2017 at 3:59 pm toBrooke, CCS PA, who verbally acknowledged these results. Electronically Signed   By: Simonne Come M.D.   On: 04/10/2017 16:42   Dg Abd Portable 1v  Result Date: 04/10/2017 CLINICAL DATA:  NG tube placement EXAM: PORTABLE ABDOMEN - 1 VIEW COMPARISON:  04/09/2017 FINDINGS: Enteric tube tip is in the left upper quadrant consistent with location in the upper stomach. Proximal side hole projects just distal to the EG junction. Visualized bowel gas pattern is normal. IMPRESSION: Enteric tube tip is in the left upper quadrant consistent with location in the upper stomach. Electronically Signed   By: Burman Nieves M.D.   On: 04/10/2017 02:36     Anti-infectives:   Anti-infectives    Start     Dose/Rate Route Frequency Ordered Stop   04/06/17 2200  levofloxacin (LEVAQUIN) IVPB 750 mg     750 mg 100 mL/hr over 90 Minutes Intravenous Every 24 hours 04/06/17 0509     04/06/17 1030  metroNIDAZOLE (FLAGYL) IVPB 500 mg     500 mg 100 mL/hr over 60 Minutes Intravenous Every 8 hours 04/06/17 1006     04/06/17 0600  aztreonam (AZACTAM) 2 g in dextrose 5 % 50 mL IVPB  Status:  Discontinued     2 g 100 mL/hr over 30 Minutes Intravenous Every 8 hours 04/06/17 0509 04/06/17 1003   04/06/17 0600  vancomycin (VANCOCIN) IVPB 1000 mg/200 mL premix  Status:  Discontinued     1,000 mg 200 mL/hr over 60 Minutes Intravenous Every 8 hours 04/06/17 0509 04/06/17 1004   04/06/17 0030  vancomycin (VANCOCIN) IVPB 1000 mg/200 mL premix     1,000 mg 200 mL/hr over 60 Minutes Intravenous  Once 04/06/17 0019 04/06/17 0208   04/06/17 0030  aztreonam (AZACTAM) 2 g in dextrose 5 % 50 mL IVPB     2 g 100 mL/hr over 30 Minutes Intravenous  Once 04/06/17 0019 04/06/17 0208   04/06/17 0030  levofloxacin (LEVAQUIN) IVPB 750 mg     750 mg 100 mL/hr over 90 Minutes Intravenous  Once 04/06/17 0019 04/06/17  0208      Ovidio Kin, MD, FACS Pager: [phone] [facility]: [phone] 04/11/2017

## 2017-04-12 ENCOUNTER — Inpatient Hospital Stay (HOSPITAL_COMMUNITY): Payer: BLUE CROSS/BLUE SHIELD

## 2017-04-12 ENCOUNTER — Other Ambulatory Visit (HOSPITAL_COMMUNITY): Payer: BLUE CROSS/BLUE SHIELD

## 2017-04-12 LAB — CBC WITH DIFFERENTIAL/PLATELET
BASOS PCT: 1 %
Basophils Absolute: 0 10*3/uL (ref 0.0–0.1)
EOS ABS: 0.2 10*3/uL (ref 0.0–0.7)
EOS PCT: 2 %
HCT: 36.9 % — ABNORMAL LOW (ref 39.0–52.0)
Hemoglobin: 13 g/dL (ref 13.0–17.0)
Lymphocytes Relative: 25 %
Lymphs Abs: 1.6 10*3/uL (ref 0.7–4.0)
MCH: 31.3 pg (ref 26.0–34.0)
MCHC: 35.2 g/dL (ref 30.0–36.0)
MCV: 88.9 fL (ref 78.0–100.0)
MONO ABS: 0.7 10*3/uL (ref 0.1–1.0)
MONOS PCT: 11 %
Neutro Abs: 3.9 10*3/uL (ref 1.7–7.7)
Neutrophils Relative %: 61 %
Platelets: 202 10*3/uL (ref 150–400)
RBC: 4.15 MIL/uL — ABNORMAL LOW (ref 4.22–5.81)
RDW: 12.5 % (ref 11.5–15.5)
WBC: 6.3 10*3/uL (ref 4.0–10.5)

## 2017-04-12 LAB — GLUCOSE, CAPILLARY
GLUCOSE-CAPILLARY: 101 mg/dL — AB (ref 65–99)
GLUCOSE-CAPILLARY: 101 mg/dL — AB (ref 65–99)
Glucose-Capillary: 87 mg/dL (ref 65–99)

## 2017-04-12 LAB — BASIC METABOLIC PANEL
Anion gap: 7 (ref 5–15)
BUN: 11 mg/dL (ref 6–20)
CALCIUM: 8.3 mg/dL — AB (ref 8.9–10.3)
CO2: 23 mmol/L (ref 22–32)
CREATININE: 0.66 mg/dL (ref 0.61–1.24)
Chloride: 109 mmol/L (ref 101–111)
GFR calc non Af Amer: >60 mL/min (ref >60)
GLUCOSE: 101 mg/dL — AB (ref 65–99)
Potassium: 3.9 mmol/L (ref 3.5–5.1)
Sodium: 139 mmol/L (ref 135–145)

## 2017-04-12 MED ORDER — MENTHOL 3 MG MT LOZG
1.0000 | LOZENGE | OROMUCOSAL | Status: DC | PRN
  Filled 2017-04-12: qty 9

## 2017-04-12 NOTE — Progress Notes (Signed)
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 He presented to the ED with chief complaint of abdominal pain, nausea, vomiting without diarrhea. In the ER,CT of the abdomen and pelvis showedinflammation of the small bowel with possible ileus, colitis. No definite abscess. He was febrile, tachycardic with leukocytosis and lactic acidosis of 4.3.Patient was started on empiric antibiotics, admitted for further treatment.   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  Need to f/u with surgery and GI.  IR felt not possible for drainage and maybe less likely abscess per GI note.  - Passing stool, NGT fell out (had some N/V last night and NG placed back to suction, but will monitor for now) - GI consult - planning possible colonoscopy prior to d/c? (surgery was rec waiting for this) - Blood culture ngtd - General surgery following - ESR/CRP elevated, neg c diff  '[x]'  GI path panel negative -Empiric levaquin/flagyl (switch to PO when able) -NPO with sips of clears, ice   Hemoccult Positive NG tube output: Possibly due to trauma from NG placement vs inflammation (gastritis, PUD), ctm.  - stable H/H - pepcid  Hypokalemia -Replace, trend - k in fluids     DVT prophylaxis: lovenox Code Status: full  Family Communication: none in room  Disposition Plan: pending improvement   Consultants:   surgery  Procedures: (Don't include imaging studies which can be auto populated. Include things that cannot be auto populated i.e. Echo, Carotid and venous dopplers, Foley, Bipap, HD, tubes/drains, wound vac, central lines etc)  none  Antimicrobials: (specify start and planned stop date. Auto populated tables are space occupying and do not give end dates) Anti-infectives    Start     Dose/Rate Route Frequency Ordered Stop   04/06/17 2200  levofloxacin (LEVAQUIN) IVPB 750 mg     750 mg 100 mL/hr over 90 Minutes Intravenous Every 24 hours 04/06/17 0509     04/06/17 1030  metroNIDAZOLE (FLAGYL) IVPB 500 mg     500 mg 100 mL/hr over 60 Minutes Intravenous Every 8 hours 04/06/17 1006     04/06/17 0600  aztreonam (AZACTAM) 2 g in dextrose 5 % 50 mL IVPB  Status:  Discontinued     2 g 100 mL/hr over 30 Minutes Intravenous Every 8 hours 04/06/17 0509 04/06/17 1003   04/06/17 0600  vancomycin (VANCOCIN) IVPB 1000 mg/200 mL premix  Status:  Discontinued     1,000 mg 200 mL/hr over 60 Minutes Intravenous Every 8 hours 04/06/17 0509 04/06/17 1004   04/06/17 0030  vancomycin (VANCOCIN) IVPB 1000 mg/200 mL premix     1,000 mg 200 mL/hr over 60 Minutes Intravenous  Once 04/06/17 0019 04/06/17 0208   04/06/17 0030  aztreonam (  AZACTAM) 2 g in dextrose 5 % 50 mL IVPB     2 g 100 mL/hr over 30 Minutes Intravenous  Once 04/06/17 0019 04/06/17 0208   04/06/17 0030  levofloxacin (LEVAQUIN) IVPB 750 mg     750 mg 100 mL/hr over 90 Minutes Intravenous  Once 04/06/17 0019 04/06/17 0208          Subjective: NG fell out.  Feels a bit better after this.  Overall feeling ok.    Objective: Vitals:   04/11/17 0451 04/11/17 1315 04/11/17 2123 04/12/17 0513  BP: 122/68 126/84 (!) 145/84 130/86  Pulse: 73 80 74 70  Resp: '18 14 14 15  ' Temp: 98.1 F (36.7 C)  98.2 F (36.8 C) 98 F (36.7 C) 98.1 F (36.7 C)  TempSrc: Oral Oral Oral Oral  SpO2: 96% 97% 95% 95%  Weight:      Height:        Intake/Output Summary (Last 24 hours) at 04/12/17 1052 Last data filed at 04/12/17 0806  Gross per 24 hour  Intake             2600 ml  Output              750 ml  Net             1850 ml   Filed Weights   04/05/17 2033 04/06/17 0338  Weight: 106.6 kg (235 lb) 114 kg (251 lb 5.2 oz)    Examination:  General: No acute distress. Cardiovascular: Heart sounds show a regular rate, and rhythm. No gallops or rubs. No murmurs. No JVD. Lungs: Clear to auscultation bilaterally with good air movement. No rales, rhonchi or wheezes. Abdomen: Soft, mild RLQ pain, nondistended. No masses. No hepatosplenomegaly. Neurological: Alert and oriented 3. Moves all extremities 4 with equal strength. Cranial nerves II through XII grossly intact. Skin: Warm and dry. No rashes or lesions. Extremities: No clubbing or cyanosis. No edema. Pedal pulses 2+. Psychiatric: Mood and affect are normal. Insight and judgment are appropriate.   Data Reviewed: I have personally reviewed following labs and imaging studies  CBC:  Recent Labs Lab 04/08/17 0422 04/09/17 0246 04/10/17 0418 04/11/17 0411 04/12/17 0425  WBC 11.4* 7.9 7.1 6.8 6.3  NEUTROABS 9.8* 5.8 4.5 4.1 3.9  HGB 13.6 12.8* 12.7* 12.7* 13.0  HCT 39.1 36.7* 36.9* 36.6* 36.9*  MCV 90.1 90.8 91.1 89.5 88.9  PLT 146* 179 192 198 169   Basic Metabolic Panel:  Recent Labs Lab 04/08/17 0422 04/09/17 0246 04/10/17 0418 04/11/17 1215 04/12/17 0425  NA 140 141 142 139 139  K 3.5 3.3* 3.3* 3.3* 3.9  CL 109 111 110 108 109  CO2 20* 21* '22 22 23  ' GLUCOSE 111* 92 100* 94 101*  BUN '12 15 14 13 11  ' CREATININE 0.79 0.78 0.68 0.66 0.66  CALCIUM 8.5* 8.1* 8.2* 8.5* 8.3*  MG  --  1.7  --   --   --    GFR: Estimated Creatinine Clearance: 171.3 mL/min (by C-G formula based on SCr of 0.66 mg/dL). Liver Function  Tests:  Recent Labs Lab 04/05/17 2125 04/06/17 0428 04/07/17 0444 04/08/17 0422  AST 34 '23 16 15  ' ALT 68* 46 34 27  ALKPHOS 78 48 51 50  BILITOT 2.6* 2.4* 4.5* 3.3*  PROT 7.7 5.7* 5.7* 5.8*  ALBUMIN 5.0 3.4* 3.4* 3.2*    Recent Labs Lab 04/05/17 2125  LIPASE 28   No results for input(s): AMMONIA in the last 168  hours. Coagulation Profile:  Recent Labs Lab 04/06/17 0428  INR 1.15   Cardiac Enzymes: No results for input(s): CKTOTAL, CKMB, CKMBINDEX, TROPONINI in the last 168 hours. BNP (last 3 results) No results for input(s): PROBNP in the last 8760 hours. HbA1C: No results for input(s): HGBA1C in the last 72 hours. CBG:  Recent Labs Lab 04/10/17 2339 04/11/17 0829 04/11/17 1723 04/12/17 0043 04/12/17 0816  GLUCAP 101* 113* 106* 101* 101*   Lipid Profile: No results for input(s): CHOL, HDL, LDLCALC, TRIG, CHOLHDL, LDLDIRECT in the last 72 hours. Thyroid Function Tests: No results for input(s): TSH, T4TOTAL, FREET4, T3FREE, THYROIDAB in the last 72 hours. Anemia Panel: No results for input(s): VITAMINB12, FOLATE, FERRITIN, TIBC, IRON, RETICCTPCT in the last 72 hours. Sepsis Labs:  Recent Labs Lab 04/06/17 0015 04/06/17 0115 04/06/17 0428 04/06/17 0800  PROCALCITON  --   --  0.57  --   LATICACIDVEN 4.04* 4.3* 2.5* 1.8  1.8    Recent Results (from the past 240 hour(s))  Blood Culture (routine x 2)     Status: None   Collection Time: 04/06/17 12:10 AM  Result Value Ref Range Status   Specimen Description BLOOD RIGHT HAND  Final   Special Requests   Final    BOTTLES DRAWN AEROBIC AND ANAEROBIC Blood Culture adequate volume   Culture   Final    NO GROWTH 5 DAYS Performed at [facility], [address] [place], [place] [address]    Report Status 04/11/2017 FINAL  Final  Blood Culture (routine x 2)     Status: None   Collection Time: 04/06/17 12:23 AM  Result Value Ref Range Status   Specimen Description BLOOD LEFT FOREARM  Final    Special Requests IN PEDIATRIC BOTTLE Blood Culture adequate volume  Final   Culture   Final    NO GROWTH 5 DAYS Performed at [facility], [place] [address], [place], [place] [address]    Report Status 04/11/2017 FINAL  Final  C difficile quick scan w PCR reflex     Status: None   Collection Time: 04/08/17  8:24 AM  Result Value Ref Range Status   C Diff antigen NEGATIVE NEGATIVE Final   C Diff toxin NEGATIVE NEGATIVE Final   C Diff interpretation No C. difficile detected.  Final  Gastrointestinal Panel by PCR , Stool     Status: None   Collection Time: 04/09/17  6:39 PM  Result Value Ref Range Status   Campylobacter species NOT DETECTED NOT DETECTED Final   Plesimonas shigelloides NOT DETECTED NOT DETECTED Final   Salmonella species NOT DETECTED NOT DETECTED Final   Yersinia enterocolitica NOT DETECTED NOT DETECTED Final   Vibrio species NOT DETECTED NOT DETECTED Final   Vibrio cholerae NOT DETECTED NOT DETECTED Final   Enteroaggregative E coli (EAEC) NOT DETECTED NOT DETECTED Final   Enteropathogenic E coli (EPEC) NOT DETECTED NOT DETECTED Final   Enterotoxigenic E coli (ETEC) NOT DETECTED NOT DETECTED Final   Shiga like toxin producing E coli (STEC) NOT DETECTED NOT DETECTED Final   Shigella/Enteroinvasive E coli (EIEC) NOT DETECTED NOT DETECTED Final   Cryptosporidium NOT DETECTED NOT DETECTED Final   Cyclospora cayetanensis NOT DETECTED NOT DETECTED Final   Entamoeba histolytica NOT DETECTED NOT DETECTED Final   Giardia lamblia NOT DETECTED NOT DETECTED Final   Adenovirus F40/41 NOT DETECTED NOT DETECTED Final   Astrovirus NOT DETECTED NOT DETECTED Final   Norovirus GI/GII NOT DETECTED NOT DETECTED Final  Rotavirus A NOT DETECTED NOT DETECTED Final   Sapovirus (I, II, IV, and V) NOT DETECTED NOT DETECTED Final         Radiology Studies: Ct Abdomen Pelvis W Contrast  Result Date: 04/10/2017 CLINICAL DATA:  Abdominal pain. History of ileocecectomy. Evaluate for  gastroenteritis or colitis versus small-bowel obstruction. EXAM: CT ABDOMEN AND PELVIS WITH CONTRAST TECHNIQUE: Multidetector CT imaging of the abdomen and pelvis was performed using the standard protocol following bolus administration of intravenous contrast. CONTRAST:  ISOVUE-300 IOPAMIDOL (ISOVUE-300) INJECTION 61% COMPARISON:  CT abdomen pelvis - 04/06/2017 ; 12/05/2016; 09/09/2016; 08/20/2016; CT-guided percutaneous drainage catheter placement- 08/23/2016; 08/21/2016 FINDINGS: Lower chest: Limited visualization of the lower thorax demonstrates subsegmental atelectasis/ scar within the bilateral lower lobes. Trace bilateral pleural effusions. No focal airspace opacities. Normal heart size.  No pericardial effusion. Hepatobiliary: Normal hepatic contour. There is diffuse decreased attenuation of the hepatic parenchyma on this postcontrast examination suggestive of hepatic steatosis. There are slightly ill-defined hyperenhancing lesions within the dome of the right lobe of the liver (coronal image 45, series 5) as well as within the central aspect of the will medial segment and 2.5 x 1.2 cm respectively, incompletely characterize though similar to previous examinations and favored to represent perfusional anomaly is versus flash filling hemangiomas. There is increased attenuation within the gallbladder fossa suggestive of either vicarious excretion of contrast (favored) versus biliary sludge. No definitive gallbladder wall thickening. There is a small amount of fluid about the caudal aspect the right lobe of the liver. Pancreas: Normal appearance of the pancreas Spleen: Normal appearance of the spleen Adrenals/Urinary Tract: There is symmetric enhancement of the bilateral kidneys. No definite renal stones this postcontrast examination. No discrete renal lesions. There is a minimal amount of grossly symmetric likely body habitus related bilateral perinephric stranding. No urine obstruction. Normal appearance the  bilateral adrenal glands. Normal appearance of the urinary bladder given underdistention. Stomach/Bowel: Enteric tubes terminates with the mid body of the stomach. Stable sequela of prior ileocecectomy. Enteric contrast extensive the level of the cecum, however there is bowel wall thickening involving multiple loops of distal small bowel within the right lower abdominal quadrant (images 66 and 67, series 2). Circumferential bowel wall thickening involving a more proximal segment of small bowel (axial image 54, series 2, coronal image 46, series 5). Extensive colonic diverticulosis. A diverticuli extends into the caudal most aspect of the inflammatory process within the right lower abdominal quadrant (coronal image 49, series 5). This finding is associated with several tiny foci of adjacent extraluminal air (representative images 69 and 70, series 2) and may communicate with the adjacent serpiginous fluid collection within the root of the abdominal mesenteries measuring approximately 11.6 x 6.6 x 1.1 cm (axial image 59, series 2; coronal image 50, series 5). No pneumoperitoneum, pneumatosis or portal venous gas. Colonic diverticulosis without evidence of diverticulitis. Vascular/Lymphatic: Normal caliber of the abdominal aorta. The major branch vessels of the abdominal aorta appear patent on this non CTA examination. Scattered retroperitoneal and mesenteric lymph nodes are numerous though individually not enlarged by size criteria with index mesenteric lymph node within the caudal root of the abdominal mesenteric measuring approximately 0.9 cm in greatest short axis diameter (image 53, series 2). No bulky retroperitoneal, mesenteric, pelvic or inguinal lymphadenopathy. Reproductive: Normal appearance of the prostate gland. No free fluid in the pelvic cul-de-sac. Other: Well-healed midline abdominal incision without evidence of hernia. Musculoskeletal: No acute or aggressive osseous abnormalities. 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 No evidence of sacroiliitis. IMPRESSION: 1. Multiple previous examinations have been reviewed and I am concerned the patient has experienced a recurrent episode of acute diverticulitis secondary to a small diverticulum arising from the anterior aspect of the sigmoid colon. This small diverticulum is associated with several small foci of pneumoperitoneum and appears to contain a serpiginous communication to a developing suspected abscess within the root of the abdominal mesentery measuring approximately 11.6 cm in diameter. 2. Interval progression of circumferential bowel wall thickening involving several loops of mid and distal small bowel, not resulting in enteric obstruction - while potentially secondary to a concomitant inflammatory etiology (such as Crohn's colitis), I favor these loops are secondarily involved due to their proximity to the suspected developing abscess within the root of the abdominal mesentery. 3. Findings suggestive of hepatic steatosis. 4. Grossly unchanged appearance of hyperenhancing hepatic lesions, incompletely characterized though favored to represent perfusional anomalies versus flash filling hemangiomas, similar previous examinations. 5. Increased attenuation of the intraluminal portion of the gallbladder favored to represent vicarious excretion of contrast from recent contrast-enhanced abdominal CT performed 04/06/2017. Critical Value/emergent results were called by telephone at the time of interpretation on 04/10/2017 at 3:59 pm toBrooke, CCS PA, who verbally acknowledged these results. Electronically Signed   By: Sandi Mariscal M.D.   On: 04/10/2017 16:42        Scheduled Meds: . enoxaparin (LOVENOX) injection  40 mg Subcutaneous Q24H  . fentaNYL (SUBLIMAZE) injection  75 mcg Intravenous Once   Continuous Infusions: . dextrose 5% lactated ringers with KCl 20 mEq/L 100 mL/hr at 04/11/17 2119    . famotidine (PEPCID) IV Stopped (04/12/17 5784)  . levofloxacin (LEVAQUIN) IV Stopped (04/11/17 2256)  . metronidazole 500 mg (04/12/17 1046)     LOS: 6 days    Time spent: 30 min    Fayrene Helper, MD [facility] [phone]   If 7PM-7AM, please contact night-coverage [email or web address] Password [facility] 04/12/2017, 10:52 AM

## 2017-04-12 NOTE — Progress Notes (Addendum)
[facility]:  [phone] General Surgery Progress Note   LOS: 6 days  POD -     Chief Complaint: Abdominal pain  Assessment and Plan: 1.  Recurrent abdominal abscess/fluid (admitted 04/05/2017)  Discussed with Dr. Adela Lank on 9/22..  IR does not think that the fluid is amendable to IR drainage and they think that fluid is probably not abscess.    WBC - 6,300 - 04/12/2017  Flagyl/Levaquin  Required NGT to be hooked back up last PM.  Has had 350 cc out of NGT since then.  I wonder if he is not more choking on the NGT and less of a SBO.  His KUB is still pending.   2.  Odd story - went through some type of small bowel perforation and resection in February, 2018  Says that he did not really feel like he was better until July.  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 Donell Beers  3.  DVT prophylaxis - Lovenox 4.  Will need to address nutrition soon, if kept NPO.   Principal Problem:   Sepsis (HCC) Active Problems:   Colitis   Abnormal CT of the abdomen   Abdominal pain  Subjective:  Some vague generalized abdominal pain - but nothing localized. Had BM.  Choking some on NGT.  Objective:   Vitals:   04/11/17 2123 04/12/17 0513  BP: (!) 145/84 130/86  Pulse: 74 70  Resp: 14 15  Temp: 98 F (36.7 C) 98.1 F (36.7 C)  SpO2: 95% 95%     Intake/Output from previous day:  09/22 0701 - 09/23 0700 In: 2700 [I.V.:2300; IV Piggyback:400] Out: 400 [Urine:300; Emesis/NG output:100]  Intake/Output this shift:  Total I/O In: -  Out: 350 [Emesis/NG output:350]   Physical Exam:   General: WN WM who is alert and oriented.  Miserable with NGT.   HEENT: Normal. Pupils equal. .   Lungs: Clear   Abdomen: Unremarkable abdominal exam.  He is not distended, has no localized tenderness, has BS.   Lab  Results:     Recent Labs  04/11/17 0411 04/12/17 0425  WBC 6.8 6.3  HGB 12.7* 13.0  HCT 36.6* 36.9*  PLT 198 202    BMET    Recent Labs  04/11/17 1215 04/12/17 0425  NA 139 139  K 3.3* 3.9  CL 108 109  CO2 22 23  GLUCOSE 94 101*  BUN 13 11  CREATININE 0.66 0.66  CALCIUM 8.5* 8.3*    PT/INR  No results for input(s): LABPROT, INR in the last 72 hours.  ABG  No results for input(s): PHART, HCO3 in the last 72 hours.  Invalid input(s): PCO2, PO2   Studies/Results:  Ct Abdomen Pelvis W Contrast  Result Date: 04/10/2017 CLINICAL DATA:  Abdominal pain. History of ileocecectomy. Evaluate for gastroenteritis or colitis versus small-bowel obstruction. EXAM: CT ABDOMEN AND PELVIS WITH CONTRAST TECHNIQUE: Multidetector CT imaging of the abdomen and pelvis was performed using the standard protocol following bolus administration of intravenous contrast. CONTRAST:  ISOVUE-300 IOPAMIDOL (ISOVUE-300) INJECTION 61% COMPARISON:  CT abdomen pelvis - 04/06/2017 ; 12/05/2016; 09/09/2016; 08/20/2016; CT-guided percutaneous drainage catheter placement- 08/23/2016; 08/21/2016 FINDINGS: Lower chest: Limited visualization of the lower thorax demonstrates subsegmental atelectasis/ scar within the bilateral lower lobes. Trace bilateral pleural effusions. No focal airspace opacities. Normal heart size.  No pericardial effusion. Hepatobiliary: Normal  hepatic contour. There is diffuse decreased attenuation of the hepatic parenchyma on this postcontrast examination suggestive of hepatic steatosis. There are slightly ill-defined hyperenhancing lesions within the dome of the right lobe of the liver (coronal image 45, series 5) as well as within the central aspect of the will medial segment and 2.5 x 1.2 cm respectively, incompletely characterize though similar to previous examinations and favored to represent perfusional anomaly is versus flash filling hemangiomas. There is increased attenuation within the  gallbladder fossa suggestive of either vicarious excretion of contrast (favored) versus biliary sludge. No definitive gallbladder wall thickening. There is a small amount of fluid about the caudal aspect the right lobe of the liver. Pancreas: Normal appearance of the pancreas Spleen: Normal appearance of the spleen Adrenals/Urinary Tract: There is symmetric enhancement of the bilateral kidneys. No definite renal stones this postcontrast examination. No discrete renal lesions. There is a minimal amount of grossly symmetric likely body habitus related bilateral perinephric stranding. No urine obstruction. Normal appearance the bilateral adrenal glands. Normal appearance of the urinary bladder given underdistention. Stomach/Bowel: Enteric tubes terminates with the mid body of the stomach. Stable sequela of prior ileocecectomy. Enteric contrast extensive the level of the cecum, however there is bowel wall thickening involving multiple loops of distal small bowel within the right lower abdominal quadrant (images 66 and 67, series 2). Circumferential bowel wall thickening involving a more proximal segment of small bowel (axial image 54, series 2, coronal image 46, series 5). Extensive colonic diverticulosis. A diverticuli extends into the caudal most aspect of the inflammatory process within the right lower abdominal quadrant (coronal image 49, series 5). This finding is associated with several tiny foci of adjacent extraluminal air (representative images 69 and 70, series 2) and may communicate with the adjacent serpiginous fluid collection within the root of the abdominal mesenteries measuring approximately 11.6 x 6.6 x 1.1 cm (axial image 59, series 2; coronal image 50, series 5). No pneumoperitoneum, pneumatosis or portal venous gas. Colonic diverticulosis without evidence of diverticulitis. Vascular/Lymphatic: Normal caliber of the abdominal aorta. The major branch vessels of the abdominal aorta appear patent on this  non CTA examination. Scattered retroperitoneal and mesenteric lymph nodes are numerous though individually not enlarged by size criteria with index mesenteric lymph node within the caudal root of the abdominal mesenteric measuring approximately 0.9 cm in greatest short axis diameter (image 53, series 2). No bulky retroperitoneal, mesenteric, pelvic or inguinal lymphadenopathy. Reproductive: Normal appearance of the prostate gland. No free fluid in the pelvic cul-de-sac. Other: Well-healed midline abdominal incision without evidence of hernia. Musculoskeletal: No acute or aggressive osseous abnormalities. 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 No evidence of sacroiliitis. IMPRESSION: 1. Multiple previous examinations have been reviewed and I am concerned the patient has experienced a recurrent episode of acute diverticulitis secondary to a small diverticulum arising from the anterior aspect of the sigmoid colon. This small diverticulum is associated with several small foci of pneumoperitoneum and appears to contain a serpiginous communication to a developing suspected abscess within the root of the abdominal mesentery measuring approximately 11.6 cm in diameter. 2. Interval progression of circumferential bowel wall thickening involving several loops of mid and distal small bowel, not resulting in enteric obstruction - while potentially secondary to a concomitant inflammatory etiology (such as Crohn's colitis), I favor these loops are secondarily involved due to their proximity to the suspected developing abscess within the root of the abdominal mesentery. 3. Findings suggestive  of hepatic steatosis. 4. Grossly unchanged appearance of hyperenhancing hepatic lesions, incompletely characterized though favored to represent perfusional anomalies versus flash filling hemangiomas, similar previous examinations. 5. Increased attenuation of the intraluminal portion  of the gallbladder favored to represent vicarious excretion of contrast from recent contrast-enhanced abdominal CT performed 04/06/2017. Critical Value/emergent results were called by telephone at the time of interpretation on 04/10/2017 at 3:59 pm toBrooke, CCS PA, who verbally acknowledged these results. Electronically Signed   By: Simonne Come M.D.   On: 04/10/2017 16:42     Anti-infectives:   Anti-infectives    Start     Dose/Rate Route Frequency Ordered Stop   04/06/17 2200  levofloxacin (LEVAQUIN) IVPB 750 mg     750 mg 100 mL/hr over 90 Minutes Intravenous Every 24 hours 04/06/17 0509     04/06/17 1030  metroNIDAZOLE (FLAGYL) IVPB 500 mg     500 mg 100 mL/hr over 60 Minutes Intravenous Every 8 hours 04/06/17 1006     04/06/17 0600  aztreonam (AZACTAM) 2 g in dextrose 5 % 50 mL IVPB  Status:  Discontinued     2 g 100 mL/hr over 30 Minutes Intravenous Every 8 hours 04/06/17 0509 04/06/17 1003   04/06/17 0600  vancomycin (VANCOCIN) IVPB 1000 mg/200 mL premix  Status:  Discontinued     1,000 mg 200 mL/hr over 60 Minutes Intravenous Every 8 hours 04/06/17 0509 04/06/17 1004   04/06/17 0030  vancomycin (VANCOCIN) IVPB 1000 mg/200 mL premix     1,000 mg 200 mL/hr over 60 Minutes Intravenous  Once 04/06/17 0019 04/06/17 0208   04/06/17 0030  aztreonam (AZACTAM) 2 g in dextrose 5 % 50 mL IVPB     2 g 100 mL/hr over 30 Minutes Intravenous  Once 04/06/17 0019 04/06/17 0208   04/06/17 0030  levofloxacin (LEVAQUIN) IVPB 750 mg     750 mg 100 mL/hr over 90 Minutes Intravenous  Once 04/06/17 0019 04/06/17 0208      Ovidio Kin, MD, FACS Pager: [phone] [facility]: [phone] 04/12/2017

## 2017-04-12 NOTE — Progress Notes (Signed)
Progress Note   Subjective  Patient feeling better so far today. NG out, states he feels much relief with this gone. Not much abdominal pain today, rated 2/10. No fevers.   Objective   Vital signs in last 24 hours: Temp:  [98 F (36.7 C)-98.2 F (36.8 C)] 98.1 F (36.7 C) (09/23 0513) Pulse Rate:  [70-80] 70 (09/23 0513) Resp:  [14-15] 15 (09/23 0513) BP: (126-145)/(84-86) 130/86 (09/23 0513) SpO2:  [95 %-97 %] 95 % (09/23 0513) Last BM Date: 04/11/17 General:    white male in NAD Heart:  Regular rate and rhythm; no murmurs Lungs: Respirations even and unlabored, lungs CTA bilaterally Abdomen:  Soft, mild RLQ and LLQ TTP, nondistended.  Extremities:  Without edema. Neurologic:  Alert and oriented,  grossly normal neurologically. Psych:  Cooperative. Normal mood and affect.  Intake/Output from previous day: 09/22 0701 - 09/23 0700 In: 2700 [I.V.:2300; IV Piggyback:400] Out: 400 [Urine:300; Emesis/NG output:100] Intake/Output this shift: Total I/O In: -  Out: 350 [Emesis/NG output:350]  Lab Results:  Recent Labs  04/10/17 0418 04/11/17 0411 04/12/17 0425  WBC 7.1 6.8 6.3  HGB 12.7* 12.7* 13.0  HCT 36.9* 36.6* 36.9*  PLT 192 198 202   BMET  Recent Labs  04/10/17 0418 04/11/17 1215 04/12/17 0425  NA 142 139 139  K 3.3* 3.3* 3.9  CL 110 108 109  CO2 GLUCOSE 100* 94 101*  BUN CREATININE 0.68 0.66 0.66  CALCIUM 8.2* 8.5* 8.3*   LFT No results for input(s): PROT, ALBUMIN, AST, ALT, ALKPHOS, BILITOT, BILIDIR, IBILI in the last 72 hours. PT/INR No results for input(s): LABPROT, INR in the last 72 hours.  Studies/Results: Ct Abdomen Pelvis W Contrast  Result Date: 04/10/2017 CLINICAL DATA:  Abdominal pain. History of ileocecectomy. Evaluate for gastroenteritis or colitis versus small-bowel obstruction. EXAM: CT ABDOMEN AND PELVIS WITH CONTRAST TECHNIQUE: Multidetector CT imaging of the abdomen and pelvis was performed using the  standard protocol following bolus administration of intravenous contrast. CONTRAST:  ISOVUE-300 IOPAMIDOL (ISOVUE-300) INJECTION 61% COMPARISON:  CT abdomen pelvis - 04/06/2017 ; 12/05/2016; 09/09/2016; 08/20/2016; CT-guided percutaneous drainage catheter placement- 08/23/2016; 08/21/2016 FINDINGS: Lower chest: Limited visualization of the lower thorax demonstrates subsegmental atelectasis/ scar within the bilateral lower lobes. Trace bilateral pleural effusions. No focal airspace opacities. Normal heart size.  No pericardial effusion. Hepatobiliary: Normal hepatic contour. There is diffuse decreased attenuation of the hepatic parenchyma on this postcontrast examination suggestive of hepatic steatosis. There are slightly ill-defined hyperenhancing lesions within the dome of the right lobe of the liver (coronal image 45, series 5) as well as within the central aspect of the will medial segment and 2.5 x 1.2 cm respectively, incompletely characterize though similar to previous examinations and favored to represent perfusional anomaly is versus flash filling hemangiomas. There is increased attenuation within the gallbladder fossa suggestive of either vicarious excretion of contrast (favored) versus biliary sludge. No definitive gallbladder wall thickening. There is a small amount of fluid about the caudal aspect the right lobe of the liver. Pancreas: Normal appearance of the pancreas Spleen: Normal appearance of the spleen Adrenals/Urinary Tract: There is symmetric enhancement of the bilateral kidneys. No definite renal stones this postcontrast examination. No discrete renal lesions. There is a minimal amount of grossly symmetric likely body habitus related bilateral perinephric stranding. No urine obstruction. Normal appearance the bilateral adrenal glands. Normal appearance of the urinary bladder given underdistention. Stomach/Bowel: Enteric tubes terminates with the  mid body of the stomach. Stable sequela of prior  ileocecectomy. Enteric contrast extensive the level of the cecum, however there is bowel wall thickening involving multiple loops of distal small bowel within the right lower abdominal quadrant (images 66 and 67, series 2). Circumferential bowel wall thickening involving a more proximal segment of small bowel (axial image 54, series 2, coronal image 46, series 5). Extensive colonic diverticulosis. A diverticuli extends into the caudal most aspect of the inflammatory process within the right lower abdominal quadrant (coronal image 49, series 5). This finding is associated with several tiny foci of adjacent extraluminal air (representative images 69 and 70, series 2) and may communicate with the adjacent serpiginous fluid collection within the root of the abdominal mesenteries measuring approximately 11.6 x 6.6 x 1.1 cm (axial image 59, series 2; coronal image 50, series 5). No pneumoperitoneum, pneumatosis or portal venous gas. Colonic diverticulosis without evidence of diverticulitis. Vascular/Lymphatic: Normal caliber of the abdominal aorta. The major branch vessels of the abdominal aorta appear patent on this non CTA examination. Scattered retroperitoneal and mesenteric lymph nodes are numerous though individually not enlarged by size criteria with index mesenteric lymph node within the caudal root of the abdominal mesenteric measuring approximately 0.9 cm in greatest short axis diameter (image 53, series 2). No bulky retroperitoneal, mesenteric, pelvic or inguinal lymphadenopathy. Reproductive: Normal appearance of the prostate gland. No free fluid in the pelvic cul-de-sac. Other: Well-healed midline abdominal incision without evidence of hernia. Musculoskeletal: No acute or aggressive osseous abnormalities. 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 No evidence of sacroiliitis. IMPRESSION: 1. Multiple previous examinations have been reviewed and I am  concerned the patient has experienced a recurrent episode of acute diverticulitis secondary to a small diverticulum arising from the anterior aspect of the sigmoid colon. This small diverticulum is associated with several small foci of pneumoperitoneum and appears to contain a serpiginous communication to a developing suspected abscess within the root of the abdominal mesentery measuring approximately 11.6 cm in diameter. 2. Interval progression of circumferential bowel wall thickening involving several loops of mid and distal small bowel, not resulting in enteric obstruction - while potentially secondary to a concomitant inflammatory etiology (such as Crohn's colitis), I favor these loops are secondarily involved due to their proximity to the suspected developing abscess within the root of the abdominal mesentery. 3. Findings suggestive of hepatic steatosis. 4. Grossly unchanged appearance of hyperenhancing hepatic lesions, incompletely characterized though favored to represent perfusional anomalies versus flash filling hemangiomas, similar previous examinations. 5. Increased attenuation of the intraluminal portion of the gallbladder favored to represent vicarious excretion of contrast from recent contrast-enhanced abdominal CT performed 04/06/2017. Critical Value/emergent results were called by telephone at the time of interpretation on 04/10/2017 at 3:59 pm toBrooke, CCS PA, who verbally acknowledged these results. Electronically Signed   By: Simonne Come M.D.   On: 04/10/2017 16:42       Assessment / Plan:   36 y/o male with history of SBO, enteritis with abdominal abscesses s/p ileocecectomy 10/16/16 with pathology not consistent with Crohns (foreign body reaction?). Re-admitted with abdominal pain, initial CT concerning for enteritis / ileus. Repeat CT scan 2 days ago raised the question of possible sigmoid diverticulitis with suspected fluid collection / abscess, along with thickening of the small bowel.  Patient does have a history of suspected sigmoid diverticulitis noted on prior CT scan in May.  I have reviewed the images and spoken at length with  radiology about this case, and spoke with Dr. Ezzard Standing. His pain continues to improve, his WBC is normal, he is not acting like he has a large diverticular abscess. It is favored that he has an inflammatory small bowel process, such as Crohn's, driving this process with associated fluid collections, while diverticulitis is also possible but seems less likely. IR did not think they could drain the fluid collection in the LLQ. Ultimately a colonoscopy will be needed to assess for Crohn's disease - when he can tolerate a prep we may proceed with this in the upcoming days. Continue antibiotics for now and start liquids if he tolerates NG out today. If he can't tolerate much PO moving forward will need TPN.  Patient in agreement with the plan, we will follow closely, call with questions.  Ileene Patrick, MD [facility] Pager [phone]

## 2017-04-12 NOTE — Progress Notes (Signed)
Patient was shaving, sneezed and NG tube came out. States he feels a lot better without tube in place. Denies present nausea and vomiting. Dr. Lowell Guitar updated. Dr. Ezzard Standing updated and said to leave tube out.  Earnest Conroy. Clelia Croft, RN

## 2017-04-12 NOTE — Progress Notes (Signed)
Upon assessment pt with increased nausea and began vomiting. NG tube unclamped and connected to low intermittent suction. On call NP made aware. New order placed for one time dose of phenergan. Will continue to monitor closely.

## 2017-04-13 LAB — GLUCOSE, CAPILLARY
GLUCOSE-CAPILLARY: 107 mg/dL — AB (ref 65–99)
GLUCOSE-CAPILLARY: 98 mg/dL (ref 65–99)
Glucose-Capillary: 134 mg/dL — ABNORMAL HIGH (ref 65–99)
Glucose-Capillary: 94 mg/dL (ref 65–99)

## 2017-04-13 LAB — CBC
HCT: 37.5 % — ABNORMAL LOW (ref 39.0–52.0)
Hemoglobin: 13.2 g/dL (ref 13.0–17.0)
MCH: 30.6 pg (ref 26.0–34.0)
MCHC: 35.2 g/dL (ref 30.0–36.0)
MCV: 86.8 fL (ref 78.0–100.0)
Platelets: 214 10*3/uL (ref 150–400)
RBC: 4.32 MIL/uL (ref 4.22–5.81)
RDW: 12.3 % (ref 11.5–15.5)
WBC: 5.9 10*3/uL (ref 4.0–10.5)

## 2017-04-13 LAB — BASIC METABOLIC PANEL
ANION GAP: 9 (ref 5–15)
BUN: 11 mg/dL (ref 6–20)
CALCIUM: 8.5 mg/dL — AB (ref 8.9–10.3)
CO2: 23 mmol/L (ref 22–32)
Chloride: 107 mmol/L (ref 101–111)
Creatinine, Ser: 0.77 mg/dL (ref 0.61–1.24)
GFR calc Af Amer: >60 mL/min (ref >60)
GLUCOSE: 107 mg/dL — AB (ref 65–99)
Potassium: 3.8 mmol/L (ref 3.5–5.1)
SODIUM: 139 mmol/L (ref 135–145)

## 2017-04-13 MED ORDER — GERHARDT'S BUTT CREAM
TOPICAL_CREAM | Freq: Two times a day (BID) | CUTANEOUS | Status: DC | PRN
  Administered 2017-04-14: 23:00:00 via TOPICAL
  Filled 2017-04-13: qty 1

## 2017-04-13 NOTE — Progress Notes (Signed)
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 He presented to the ED with chief complaint of abdominal pain, nausea, vomiting without diarrhea. In the ER,CT of the abdomen and pelvis showedinflammation of the small bowel with possible ileus, colitis. No definite abscess. He was febrile, tachycardic with leukocytosis and lactic acidosis of 4.3.Patient was started on empiric antibiotics, admitted for further treatment.   Assessment & Plan:   Principal Problem:   Sepsis ([place]) Active Problems:   Colitis   Abnormal CT of the abdomen   Abdominal pain   Sepsis secondary to colitis  SBO - Hx of recurrent pelvic abscess and perforated viscus s/p ileocecectomyin Feb 2018 - no fam hx of IBD per report - notably path from small bowel resection in 09/2016 with "benign" findings, but given history would still benefit from GI follow up - plain films with evidence of SBO '[ ]'  CT with concern for abscess, bowel wall thickening (see report) -- GI spoke to IR per their note and maybe less likely abscess?  Need to f/u with surgery and GI.  IR felt not possible for drainage and maybe less likely abscess per GI note.  - Passing stool, NGT fell out (had some N/V last night and NG placed back to suction, but will monitor for now) - GI consult - planning possible colonoscopy prior to d/c?  - tolerating liquid diet today - Blood culture ngtd - General surgery following - ESR/CRP elevated, neg c diff  '[x]'  GI path panel negative -Empiric levaquin/flagyl (switch to PO when able)  Hemoccult Positive NG tube output: Possibly due to trauma from NG placement vs inflammation (gastritis, PUD), ctm.  - stable H/H - pepcid  Hypokalemia -Replace, trend - k in fluids    DVT prophylaxis: lovenox Code  Status: full  Family Communication: none in room  Disposition Plan: pending improvement   Consultants:   surgery  Procedures: (Don't include imaging studies which can be auto populated. Include things that cannot be auto populated i.e. Echo, Carotid and venous dopplers, Foley, Bipap, HD, tubes/drains, wound vac, central lines etc)  none  Antimicrobials: (specify start and planned stop date. Auto populated tables are space occupying and do not give end dates) Anti-infectives    Start     Dose/Rate Route Frequency Ordered Stop   04/06/17 2200  levofloxacin (LEVAQUIN) IVPB 750 mg     750 mg 100 mL/hr over 90 Minutes Intravenous Every 24 hours 04/06/17 0509     04/06/17 1030  metroNIDAZOLE (FLAGYL) IVPB 500 mg     500 mg 100 mL/hr over 60 Minutes Intravenous Every 8 hours 04/06/17 1006     04/06/17 0600  aztreonam (AZACTAM) 2 g in dextrose 5 % 50 mL IVPB  Status:  Discontinued     2 g 100 mL/hr over 30 Minutes Intravenous Every 8 hours 04/06/17 0509 04/06/17 1003   04/06/17 0600  vancomycin (VANCOCIN) IVPB 1000 mg/200 mL premix  Status:  Discontinued     1,000 mg 200 mL/hr over 60 Minutes Intravenous Every 8 hours 04/06/17 0509 04/06/17 1004   04/06/17 0030  vancomycin (VANCOCIN) IVPB 1000 mg/200 mL premix     1,000 mg 200 mL/hr over 60 Minutes Intravenous  Once 04/06/17 0019 04/06/17 0208   04/06/17 0030  aztreonam (AZACTAM) 2 g in dextrose 5 %  50 mL IVPB     2 g 100 mL/hr over 30 Minutes Intravenous  Once 04/06/17 0019 04/06/17 0208   04/06/17 0030  levofloxacin (LEVAQUIN) IVPB 750 mg     750 mg 100 mL/hr over 90 Minutes Intravenous  Once 04/06/17 0019 04/06/17 0208          Subjective: Feeling ok.  Tolerating liquids.  Some discomfort when having BM.  Otherwise okay.  Nervous.   Objective: Vitals:   04/12/17 2151 04/13/17 0455 04/13/17 0502 04/13/17 1251  BP: 136/83 135/83 139/89 134/75  Pulse: 79 68 79 95  Resp: '20 20 20   ' Temp: 99.1 F (37.3 C) 98.5 F (36.9 C)  98.5 F (36.9 C) 98.2 F (36.8 C)  TempSrc: Oral Oral Oral Oral  SpO2: 98% 98% 97% 98%  Weight:      Height:        Intake/Output Summary (Last 24 hours) at 04/13/17 1418 Last data filed at 04/13/17 1154  Gross per 24 hour  Intake             3040 ml  Output                0 ml  Net             3040 ml   Filed Weights   04/05/17 2033 04/06/17 0338  Weight: 106.6 kg (235 lb) 114 kg (251 lb 5.2 oz)    Examination:  General: No acute distress. Cardiovascular: Heart sounds show [name] regular rate, and rhythm. No gallops or rubs. No murmurs. No JVD. Lungs: Clear to auscultation bilaterally with good air movement. No rales, rhonchi or wheezes. Abdomen: Soft, nontender, nondistended with normal active bowel sounds. No masses. No hepatosplenomegaly. Neurological: Alert and oriented 3. Moves all extremities 4 with equal strength. Cranial nerves II through XII grossly intact. Skin: Warm and dry. No rashes or lesions. Extremities: No clubbing or cyanosis. No edema. Pedal pulses 2+. Psychiatric: Mood and affect are normal. Insight and judgment are appropriate.   Data Reviewed: I have personally reviewed following labs and imaging studies  CBC:  Recent Labs Lab 04/08/17 0422 04/09/17 0246 04/10/17 0418 04/11/17 0411 04/12/17 0425 04/13/17 0457  WBC 11.4* 7.9 7.1 6.8 6.3 5.9  NEUTROABS 9.8* 5.8 4.5 4.1 3.9  --   HGB 13.6 12.8* 12.7* 12.7* 13.0 13.2  HCT 39.1 36.7* 36.9* 36.6* 36.9* 37.5*  MCV 90.1 90.8 91.1 89.5 88.9 86.8  PLT 146* 179 192 198 202 141   Basic Metabolic Panel:  Recent Labs Lab 04/09/17 0246 04/10/17 0418 04/11/17 1215 04/12/17 0425 04/13/17 0457  NA 141 142 139 139 139  K 3.3* 3.3* 3.3* 3.9 3.8  CL 111 110 108 109 107  CO2 21* '22 22 23 23  ' GLUCOSE 92 100* 94 101* 107*  BUN '15 14 13 11 11  ' CREATININE 0.78 0.68 0.66 0.66 0.77  CALCIUM 8.1* 8.2* 8.5* 8.3* 8.5*  MG 1.7  --   --   --   --    GFR: Estimated Creatinine Clearance: 171.3 mL/min (by C-G  formula based on SCr of 0.77 mg/dL). Liver Function Tests:  Recent Labs Lab 04/07/17 0444 04/08/17 0422  AST 16 15  ALT 34 27  ALKPHOS 51 50  BILITOT 4.5* 3.3*  PROT 5.7* 5.8*  ALBUMIN 3.4* 3.2*   No results for input(s): LIPASE, AMYLASE in the last 168 hours. No results for input(s): AMMONIA in the last 168 hours. Coagulation Profile: No results for input(s): INR,  PROTIME in the last 168 hours. Cardiac Enzymes: No results for input(s): CKTOTAL, CKMB, CKMBINDEX, TROPONINI in the last 168 hours. BNP (last 3 results) No results for input(s): PROBNP in the last 8760 hours. HbA1C: No results for input(s): HGBA1C in the last 72 hours. CBG:  Recent Labs Lab 04/12/17 0043 04/12/17 0816 04/12/17 1639 04/13/17 0009 04/13/17 0745  GLUCAP 101* 101* 87 98 107*   Lipid Profile: No results for input(s): CHOL, HDL, LDLCALC, TRIG, CHOLHDL, LDLDIRECT in the last 72 hours. Thyroid Function Tests: No results for input(s): TSH, T4TOTAL, FREET4, T3FREE, THYROIDAB in the last 72 hours. Anemia Panel: No results for input(s): VITAMINB12, FOLATE, FERRITIN, TIBC, IRON, RETICCTPCT in the last 72 hours. Sepsis Labs: No results for input(s): PROCALCITON, LATICACIDVEN in the last 168 hours.  Recent Results (from the past 240 hour(s))  Blood Culture (routine x 2)     Status: None   Collection Time: 04/06/17 12:10 AM  Result Value Ref Range Status   Specimen Description BLOOD RIGHT HAND  Final   Special Requests   Final    BOTTLES DRAWN AEROBIC AND ANAEROBIC Blood Culture adequate volume   Culture   Final    NO GROWTH 5 DAYS Performed at [facility], [address] [place], [place] [address]    Report Status 04/11/2017 FINAL  Final  Blood Culture (routine x 2)     Status: None   Collection Time: 04/06/17 12:23 AM  Result Value Ref Range Status   Specimen Description BLOOD LEFT FOREARM  Final   Special Requests IN PEDIATRIC BOTTLE Blood Culture adequate volume  Final   Culture    Final    NO GROWTH 5 DAYS Performed at [facility], [place] [address] [place], [place] [address]    Report Status 04/11/2017 FINAL  Final  C difficile quick scan w PCR reflex     Status: None   Collection Time: 04/08/17  8:24 AM  Result Value Ref Range Status   C Diff antigen NEGATIVE NEGATIVE Final   C Diff toxin NEGATIVE NEGATIVE Final   C Diff interpretation No C. difficile detected.  Final  Gastrointestinal Panel by PCR , Stool     Status: None   Collection Time: 04/09/17  6:39 PM  Result Value Ref Range Status   Campylobacter species NOT DETECTED NOT DETECTED Final   Plesimonas shigelloides NOT DETECTED NOT DETECTED Final   Salmonella species NOT DETECTED NOT DETECTED Final   Yersinia enterocolitica NOT DETECTED NOT DETECTED Final   Vibrio species NOT DETECTED NOT DETECTED Final   Vibrio cholerae NOT DETECTED NOT DETECTED Final   Enteroaggregative E coli (EAEC) NOT DETECTED NOT DETECTED Final   Enteropathogenic E coli (EPEC) NOT DETECTED NOT DETECTED Final   Enterotoxigenic E coli (ETEC) NOT DETECTED NOT DETECTED Final   Shiga like toxin producing E coli (STEC) NOT DETECTED NOT DETECTED Final   Shigella/Enteroinvasive E coli (EIEC) NOT DETECTED NOT DETECTED Final   Cryptosporidium NOT DETECTED NOT DETECTED Final   Cyclospora cayetanensis NOT DETECTED NOT DETECTED Final   Entamoeba histolytica NOT DETECTED NOT DETECTED Final   Giardia lamblia NOT DETECTED NOT DETECTED Final   Adenovirus F40/41 NOT DETECTED NOT DETECTED Final   Astrovirus NOT DETECTED NOT DETECTED Final   Norovirus GI/GII NOT DETECTED NOT DETECTED Final   Rotavirus [name] NOT DETECTED NOT DETECTED Final   Sapovirus (I, II, IV, and V) NOT DETECTED NOT DETECTED Final         Radiology Studies:  Dg Abd 2 Views  Result Date: 04/12/2017 CLINICAL DATA:  Small bowel obstruction. EXAM: ABDOMEN - 2 VIEW COMPARISON:  CT of the abdomen and pelvis and abdominal films on 04/10/2017 FINDINGS: No further dilated small  bowel loops. Air is present in nondilated colon. No free air. Nasogastric tube extends into the proximal stomach. IMPRESSION: No evidence of small bowel obstruction or significant ileus. No free air. Electronically Signed   By: Aletta Edouard M.D.   On: 04/12/2017 11:03        Scheduled Meds: . enoxaparin (LOVENOX) injection  40 mg Subcutaneous Q24H   Continuous Infusions: . dextrose 5% lactated ringers with KCl 20 mEq/L 100 mL/hr at 04/13/17 0047  . famotidine (PEPCID) IV Stopped (04/13/17 1124)  . levofloxacin (LEVAQUIN) IV Stopped (04/12/17 2324)  . metronidazole Stopped (04/13/17 1154)     LOS: 7 days    Time spent: 30 min    Fayrene Helper, MD [facility] [phone]   If 7PM-7AM, please contact night-coverage [email or web address] Password [email or web address] 04/13/2017, 2:18 PM

## 2017-04-13 NOTE — Progress Notes (Signed)
[facility]:  [phone] General Surgery Progress Note   LOS: 7 days  POD -     Chief Complaint: Abdominal pain  Assessment and Plan: 1.  Recurrent abdominal abscess/fluid (admitted 04/05/2017)  Discussed with Dr. Adela Lank on 9/22..  IR does not think that the fluid is amendable to IR drainage and they think that fluid is probably not abscess.    WBC - 6,300 - 04/12/2017  Flagyl/Levaquin  NG removed yesterday   2.  Odd story - went through some type of small bowel perforation and resection in February, 2018  Says that he did not really feel like he was better until July.  Current event came on suddenly last 0 mg 100 mL/hr over 60  Minutes Intravenous Every 8 hours 04/06/17 1006     04/06/17 0600  aztreonam (AZACTAM) 2 g in dextrose 5 % 50 mL IVPB  Status:  Discontinued     2 g 100 mL/hr over 30 Minutes Intravenous Every 8 hours 04/06/17 0509 04/06/17 1003   04/06/17 0600  vancomycin (VANCOCIN) IVPB 1000 mg/200 mL premix  Status:  Discontinued     1,000 mg 200 mL/hr over 60 Minutes Intravenous Every 8 hours 04/06/17 0509 04/06/17 1004   04/06/17 0030  vancomycin (VANCOCIN) IVPB 1000 mg/200 mL premix     1,000 mg 200 mL/hr over 60 Minutes Intravenous  Once 04/06/17 0019 04/06/17 0208   04/06/17 0030  aztreonam (AZACTAM) 2 g in dextrose 5 % 50 mL IVPB     2 g 100 mL/hr over 30 Minutes Intravenous  Once 04/06/17 0019 04/06/17 0208   04/06/17 0030  levofloxacin (LEVAQUIN)  IVPB 750 mg     750 mg 100 mL/hr over 90 Minutes Intravenous  Once 04/06/17 0019 04/06/17 0208      Berna Bue MD [facility]: [phone] 04/13/2017

## 2017-04-13 NOTE — Progress Notes (Signed)
Patient ID: Jove Beyl, male   DOB: Sep 30, 1980, 36 y.o.   MRN: [id number]    Progress Note   Subjective   Just finished walking halls, feeling a little better- has just started clear liquids this am, Taking a very slowly, no nausea or vomiting so far. He is having bowel movements. Denies any abdominal pain.   Objective   Vital signs in last 24 hours: Temp:  [98.5 F (36.9 C)-99.1 F (37.3 C)] 98.5 F (36.9 C) (09/24 0502) Pulse Rate:  [67-79] 79 (09/24 0502) Resp:  [15-20] 20 (09/24 0502) BP: (135-144)/(83-89) 139/89 (09/24 0502) SpO2:  [97 %-99 %] 97 % (09/24 0502) Last BM Date: 04/11/17 General:    white male in NAD Heart:  Regular rate and rhythm; no murmurs Lungs: Respirations even and unlabored, lungs CTA bilaterally Abdomen:  Soft, nontender and nondistended. Normal bowel sounds., Midline incisional scar Extremities:  Without edema. Neurologic:  Alert and oriented,  grossly normal neurologically. Psych:  Cooperative. Normal mood and affect.  Intake/Output from previous day: 09/23 0701 - 09/24 0700 In: 2400 [I.V.:2000; IV Piggyback:400] Out: 350 [Emesis/NG output:350] Intake/Output this shift: No intake/output data recorded.  Lab Results:  Recent Labs  04/11/17 0411 04/12/17 0425 04/13/17 0457  WBC 6.8 6.3 5.9  HGB 12.7* 13.0 13.2  HCT 36.6* 36.9* 37.5*  PLT 198 202 214   BMET  Recent Labs  04/11/17 1215 04/12/17 0425 04/13/17 0457  NA 139 139 139  K 3.3* 3.9 3.8  CL 108 109 107  CO2 GLUCOSE 94 101* 107*  BUN CREATININE 0.66 0.66 0.77  CALCIUM 8.5* 8.3* 8.5*   LFT No results for input(s): PROT, ALBUMIN, AST, ALT, ALKPHOS, BILITOT, BILIDIR, IBILI in the last 72 hours. PT/INR No results for input(s): LABPROT, INR in the last 72 hours.  Studies/Results: Dg Abd 2 Views  Result Date: 04/12/2017 CLINICAL DATA:  Small bowel obstruction. EXAM: ABDOMEN - 2 VIEW COMPARISON:  CT of the abdomen and pelvis and abdominal films on  04/10/2017 FINDINGS: No further dilated small bowel loops. Air is present in nondilated colon. No free air. Nasogastric tube extends into the proximal stomach. IMPRESSION: No evidence of small bowel obstruction or significant ileus. No free air. Electronically Signed   By: Irish Lack M.D.   On: 04/12/2017 11:03       Assessment / Plan:    #43  36 year old male with history of small bowel obstruction, enteritis and associated abdominal abscesses status post ileocecectomy March 2018 with pathology not consistent with Crohn's. Patient readmitted with abdominal pain acutely right lower abdomen, and initial CT concerning for enteritis/ileitis. Repeat CT scan on 922 raised question of possible sigmoid diverticulitis and suspected fluid collection/abscess along with thickening of the small bowel. He is currently being covered with Flagyl and Levaquin-will continue NG out since yesterday and starting clear liquids today  Etiology of his now recurrent small bowel inflammatory process is suspicious for Crohn's. IR does not feel he can drain the fluid collection. Will need colonoscopy next from a GI standpoint. We will let him try clear liquids today and if does well with that consider bowel prep tomorrow and colonoscopy on Wednesday.    Discharge Planning    Contact  Lior Cartelli, P.A.-C               [phone]      Principal Problem:   Sepsis [facility]) Active Problems:   Colitis   Abnormal CT of the  abdomen   Abdominal pain     LOS: 7 days   Sumiya Mamaril  04/13/2017, 10:22 AM

## 2017-04-14 LAB — BASIC METABOLIC PANEL
ANION GAP: 7 (ref 5–15)
BUN: 8 mg/dL (ref 6–20)
CHLORIDE: 107 mmol/L (ref 101–111)
CO2: 22 mmol/L (ref 22–32)
Calcium: 8.5 mg/dL — ABNORMAL LOW (ref 8.9–10.3)
Creatinine, Ser: 0.77 mg/dL (ref 0.61–1.24)
GFR calc non Af Amer: >60 mL/min (ref >60)
Glucose, Bld: 101 mg/dL — ABNORMAL HIGH (ref 65–99)
POTASSIUM: 3.9 mmol/L (ref 3.5–5.1)
SODIUM: 136 mmol/L (ref 135–145)

## 2017-04-14 LAB — GLUCOSE, CAPILLARY
GLUCOSE-CAPILLARY: 104 mg/dL — AB (ref 65–99)
GLUCOSE-CAPILLARY: 114 mg/dL — AB (ref 65–99)
GLUCOSE-CAPILLARY: 94 mg/dL (ref 65–99)
GLUCOSE-CAPILLARY: 96 mg/dL (ref 65–99)

## 2017-04-14 LAB — MAGNESIUM: Magnesium: 1.9 mg/dL (ref 1.7–2.4)

## 2017-04-14 MED ORDER — PEG-KCL-NACL-NASULF-NA ASC-C 100 G PO SOLR
1.0000 | Freq: Once | ORAL | Status: AC
  Administered 2017-04-14: 200 g via ORAL
  Filled 2017-04-14: qty 1

## 2017-04-14 NOTE — Progress Notes (Signed)
      Progress Note   Subjective  Patient states he is tolerating clears well, not much pain.    Objective   Vital signs in last 24 hours: Temp:  [97.8 F (36.6 C)-98.6 F (37 C)] 97.8 F (36.6 C) (09/25 0629) Pulse Rate:  [71-95] 71 (09/25 0629) Resp:  [20] 20 (09/25 0629) BP: (131-136)/(75-81) 131/81 (09/25 0629) SpO2:  [95 %-99 %] 95 % (09/25 0629) Last BM Date: 04/14/17 General:    white male in NAD Heart:  Regular rate and rhythm; no murmurs Lungs: Respirations even and unlabored, lungs CTA bilaterally Abdomen:  Soft, nontender and nondistended.  Extremities:  Without edema. Neurologic:  Alert and oriented,  grossly normal neurologically. Psych:  Cooperative. Normal mood and affect.  Intake/Output from previous day: 09/24 0701 - 09/25 0700 In: 3406.7 [P.O.:200; I.V.:2806.7; IV Piggyback:400] Out: -  Intake/Output this shift: Total I/O In: 150 [IV Piggyback:150] Out: -   Lab Results:  Recent Labs  04/12/17 0425 04/13/17 0457  WBC 6.3 5.9  HGB 13.0 13.2  HCT 36.9* 37.5*  PLT 202 214   BMET  Recent Labs  04/12/17 0425 04/13/17 0457 04/14/17 0443  NA 139 139 136  K 3.9 3.8 3.9  CL 109 107 107  CO2 GLUCOSE 101* 107* 101*  BUN CREATININE 0.66 0.77 0.77  CALCIUM 8.3* 8.5* 8.5*   LFT No results for input(s): PROT, ALBUMIN, AST, ALT, ALKPHOS, BILITOT, BILIDIR, IBILI in the last 72 hours. PT/INR No results for input(s): LABPROT, INR in the last 72 hours.  Studies/Results: No results found.     Assessment / Plan:   36 y/o male with history of SBO, enteritis with abdominal abscesses s/p ileocecectomy 10/16/16 with pathology not consistent with Crohns (foreign body reaction?). Re-admitted with abdominal pain, initial CT concerning for enteritis / ileus. Repeat CT scan last week raised the question of possiblesigmoid diverticulitis with suspected fluid collection / abscess, along with thickening of the small bowel; however,  following review with radiology diverticular related abscess seems less likely and Crohns remains possible. IR did not think they could drain fluid collection. His pain continues to improve, his WBC is normal  He is tolerating clears at this point, we discussed options, he wishes to proceed with colonoscopy to assess for Crohn's. We have discussed risks / benefits at length and he verbalized understanding. Slow bowel prep today, NPO other than prep after midnight. We will assess him in the morning tomorrow to determine if we will proceed with colonoscopy or if he needs more time.   Patient in agreement with the plan, call with questions.  Ileene Patrick, MD [facility] Pager [phone]

## 2017-04-14 NOTE — Progress Notes (Signed)
[facility]:  [phone] General Surgery Progress Note   LOS: 8 days  POD -     Chief Complaint: Abdominal pain  Assessment and Plan: 1.  Recurrent abdominal abscess/fluid (admitted 04/05/2017)  Discussed with Dr. Adela Lank on 9/22..  IR does not think that the fluid is amendable to IR drainage and they think that fluid is probably not abscess.    WBC - 6,300 - 04/12/2017  Flagyl/Levaquin  Pain is resolving and he feels hungry   2.  Odd story - went through some type of small bowel perforation and resection in February, 2018  Says that he did not really feel like he was better until July.  Current event came on suddenly last /17/18 0600  aztreonam (AZACTAM) 2 g in dextrose 5 % 50 mL IVPB  Status:  Discontinued     2 g 100 mL/hr over 30 Minutes Intravenous Every 8 hours 04/06/17 0509 04/06/17 1003   04/06/17 0600  vancomycin (VANCOCIN) IVPB 1000 mg/200 mL premix  Status:  Discontinued     1,000 mg 200 mL/hr over 60 Minutes Intravenous Every  8 hours 04/06/17 0509 04/06/17 1004   04/06/17 0030  vancomycin (VANCOCIN) IVPB 1000 mg/200 mL premix     1,000 mg 200 mL/hr over 60 Minutes Intravenous  Once 04/06/17 0019 04/06/17 0208   04/06/17 0030  aztreonam (AZACTAM) 2 g in dextrose 5 % 50 mL IVPB     2 g 100 mL/hr over 30 Minutes Intravenous  Once 04/06/17 0019 04/06/17 0208   04/06/17 0030  levofloxacin (LEVAQUIN) IVPB 750 mg     750 mg 100 mL/hr over 90 Minutes Intravenous  Once 04/06/17 0019 04/06/17 0208      Berna Bue MD [facility]: [phone] 04/14/2017

## 2017-04-14 NOTE — Progress Notes (Signed)
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 He presented to the ED with chief complaint of abdominal pain, nausea, vomiting without diarrhea. In the ER,CT of the abdomen and pelvis showedinflammation of the small bowel with possible ileus, colitis. No definite abscess. He was febrile, tachycardic with leukocytosis and lactic acidosis of 4.3.Patient was started on empiric antibiotics, admitted for further treatment.   Assessment & Plan:   Principal Problem:   Sepsis ([place]) Active Problems:   Colitis   Abnormal CT of the abdomen   Abdominal pain   Sepsis secondary to colitis  SBO - Hx of recurrent pelvic abscess and perforated viscus s/p ileocecectomyin Feb 2018 - no fam hx of IBD per report - notably path from small bowel resection in 09/2016 with "benign" findings, but given history would still benefit from GI follow up - plain films with evidence of SBO '[ ]'  CT with concern for abscess, bowel wall thickening (see report) -- GI spoke to IR per their note and maybe less likely abscess?  Need to f/u with surgery and GI.  IR felt not possible for drainage and maybe less likely abscess per GI note.  - GI consult - planning possible colonoscopy prior to d/c?  Possibly tomorrow if tolerates bowel prep? Will discuss with GI.   - Gen surg following - tolerating liquid diet yesterday - Blood culture ngtd - General surgery following - ESR/CRP elevated, neg c diff  '[x]'  GI path panel negative -Empiric levaquin/flagyl (switch to PO when able)  Hemoccult Positive NG tube output: Possibly due to trauma from NG placement vs inflammation (gastritis, PUD), ctm.  - stable H/H - pepcid  Hypokalemia -Replace, trend - k in fluids    DVT prophylaxis: lovenox Code Status: full  Family  Communication: none in room  Disposition Plan: pending improvement   Consultants:   surgery  Procedures: (Don't include imaging studies which can be auto populated. Include things that cannot be auto populated i.e. Echo, Carotid and venous dopplers, Foley, Bipap, HD, tubes/drains, wound vac, central lines etc)  none  Antimicrobials: (specify start and planned stop date. Auto populated tables are space occupying and do not give end dates) Anti-infectives    Start     Dose/Rate Route Frequency Ordered Stop   04/06/17 2200  levofloxacin (LEVAQUIN) IVPB 750 mg     750 mg 100 mL/hr over 90 Minutes Intravenous Every 24 hours 04/06/17 0509     04/06/17 1030  metroNIDAZOLE (FLAGYL) IVPB 500 mg     500 mg 100 mL/hr over 60 Minutes Intravenous Every 8 hours 04/06/17 1006     04/06/17 0600  aztreonam (AZACTAM) 2 g in dextrose 5 % 50 mL IVPB  Status:  Discontinued     2 g 100 mL/hr over 30 Minutes Intravenous Every 8 hours 04/06/17 0509 04/06/17 1003   04/06/17 0600  vancomycin (VANCOCIN) IVPB 1000 mg/200 mL premix  Status:  Discontinued     1,000 mg 200 mL/hr over 60 Minutes Intravenous Every 8 hours 04/06/17 0509 04/06/17 1004   04/06/17 0030  vancomycin (VANCOCIN) IVPB 1000 mg/200 mL premix     1,000 mg 200 mL/hr over 60 Minutes Intravenous  Once 04/06/17 0019 04/06/17 0208   04/06/17 0030  aztreonam (AZACTAM) 2 g in dextrose 5 % 50 mL IVPB  2 g 100 mL/hr over 30 Minutes Intravenous  Once 04/06/17 0019 04/06/17 0208   04/06/17 0030  levofloxacin (LEVAQUIN) IVPB 750 mg     750 mg 100 mL/hr over 90 Minutes Intravenous  Once 04/06/17 0019 04/06/17 0208          Subjective: Tolerated liquids. No pain other than when he passes gas.  Nervous.   Objective: Vitals:   04/13/17 0502 04/13/17 1251 04/13/17 2045 04/14/17 0629  BP: 139/89 134/75 136/79 131/81  Pulse: 79 95 79 71  Resp: 20   20  Temp: 98.5 F (36.9 C) 98.2 F (36.8 C) 98.6 F (37 C) 97.8 F (36.6 C)  TempSrc:  Oral Oral Oral Oral  SpO2: 97% 98% 99% 95%  Weight:      Height:        Intake/Output Summary (Last 24 hours) at 04/14/17 0950 Last data filed at 04/14/17 0944  Gross per 24 hour  Intake          3556.67 ml  Output                0 ml  Net          3556.67 ml   Filed Weights   04/05/17 2033 04/06/17 0338  Weight: 106.6 kg (235 lb) 114 kg (251 lb 5.2 oz)    Examination:  General: No acute distress. Cardiovascular: Heart sounds show Denzell Colasanti regular rate, and rhythm. No gallops or rubs. No murmurs. No JVD. Lungs: Clear to auscultation bilaterally with good air movement. No rales, rhonchi or wheezes. Abdomen: Soft, nontender, nondistended with normal active bowel sounds. No masses. No hepatosplenomegaly. Neurological: Alert and oriented 3. Moves all extremities 4 with equal strength. Cranial nerves II through XII grossly intact. Skin: Warm and dry. No rashes or lesions. Extremities: No clubbing or cyanosis. No edema. Pedal pulses 2+. Psychiatric: Mood and affect are normal. Insight and judgment are appropriate.   Data Reviewed: I have personally reviewed following labs and imaging studies  CBC:  Recent Labs Lab 04/08/17 0422 04/09/17 0246 04/10/17 0418 04/11/17 0411 04/12/17 0425 04/13/17 0457  WBC 11.4* 7.9 7.1 6.8 6.3 5.9  NEUTROABS 9.8* 5.8 4.5 4.1 3.9  --   HGB 13.6 12.8* 12.7* 12.7* 13.0 13.2  HCT 39.1 36.7* 36.9* 36.6* 36.9* 37.5*  MCV 90.1 90.8 91.1 89.5 88.9 86.8  PLT 146* 179 192 198 202 341   Basic Metabolic Panel:  Recent Labs Lab 04/09/17 0246 04/10/17 0418 04/11/17 1215 04/12/17 0425 04/13/17 0457 04/14/17 0443  NA 141 142 139 139 139 136  K 3.3* 3.3* 3.3* 3.9 3.8 3.9  CL 111 110 108 109 107 107  CO2 21* '22 22 23 23 22  ' GLUCOSE 92 100* 94 101* 107* 101*  BUN '15 14 13 11 11 8  ' CREATININE 0.78 0.68 0.66 0.66 0.77 0.77  CALCIUM 8.1* 8.2* 8.5* 8.3* 8.5* 8.5*  MG 1.7  --   --   --   --  1.9   GFR: Estimated Creatinine Clearance: 171.3 mL/min (by  C-G formula based on SCr of 0.77 mg/dL). Liver Function Tests:  Recent Labs Lab 04/08/17 0422  AST 15  ALT 27  ALKPHOS 50  BILITOT 3.3*  PROT 5.8*  ALBUMIN 3.2*   No results for input(s): LIPASE, AMYLASE in the last 168 hours. No results for input(s): AMMONIA in the last 168 hours. Coagulation Profile: No results for input(s): INR, PROTIME in the last 168 hours. Cardiac Enzymes: No results for input(s): CKTOTAL, CKMB,  CKMBINDEX, TROPONINI in the last 168 hours. BNP (last 3 results) No results for input(s): PROBNP in the last 8760 hours. HbA1C: No results for input(s): HGBA1C in the last 72 hours. CBG:  Recent Labs Lab 04/13/17 0745 04/13/17 1703 04/13/17 2043 04/14/17 0111 04/14/17 0805  GLUCAP 107* 134* 94 96 114*   Lipid Profile: No results for input(s): CHOL, HDL, LDLCALC, TRIG, CHOLHDL, LDLDIRECT in the last 72 hours. Thyroid Function Tests: No results for input(s): TSH, T4TOTAL, FREET4, T3FREE, THYROIDAB in the last 72 hours. Anemia Panel: No results for input(s): VITAMINB12, FOLATE, FERRITIN, TIBC, IRON, RETICCTPCT in the last 72 hours. Sepsis Labs: No results for input(s): PROCALCITON, LATICACIDVEN in the last 168 hours.  Recent Results (from the past 240 hour(s))  Blood Culture (routine x 2)     Status: None   Collection Time: 04/06/17 12:10 AM  Result Value Ref Range Status   Specimen Description BLOOD RIGHT HAND  Final   Special Requests   Final    BOTTLES DRAWN AEROBIC AND ANAEROBIC Blood Culture adequate volume   Culture   Final    NO GROWTH 5 DAYS Performed at [facility], [address], [place], [place] [address]    Report Status 04/11/2017 FINAL  Final  Blood Culture (routine x 2)     Status: None   Collection Time: 04/06/17 12:23 AM  Result Value Ref Range Status   Specimen Description BLOOD LEFT FOREARM  Final   Special Requests IN PEDIATRIC BOTTLE Blood Culture adequate volume  Final   Culture   Final    NO GROWTH 5  DAYS Performed at [facility], [place] [address] [place], [place] [address]    Report Status 04/11/2017 FINAL  Final  C difficile quick scan w PCR reflex     Status: None   Collection Time: 04/08/17  8:24 AM  Result Value Ref Range Status   C Diff antigen NEGATIVE NEGATIVE Final   C Diff toxin NEGATIVE NEGATIVE Final   C Diff interpretation No C. difficile detected.  Final  Gastrointestinal Panel by PCR , Stool     Status: None   Collection Time: 04/09/17  6:39 PM  Result Value Ref Range Status   Campylobacter species NOT DETECTED NOT DETECTED Final   Plesimonas shigelloides NOT DETECTED NOT DETECTED Final   Salmonella species NOT DETECTED NOT DETECTED Final   Yersinia enterocolitica NOT DETECTED NOT DETECTED Final   Vibrio species NOT DETECTED NOT DETECTED Final   Vibrio cholerae NOT DETECTED NOT DETECTED Final   Enteroaggregative E coli (EAEC) NOT DETECTED NOT DETECTED Final   Enteropathogenic E coli (EPEC) NOT DETECTED NOT DETECTED Final   Enterotoxigenic E coli (ETEC) NOT DETECTED NOT DETECTED Final   Shiga like toxin producing E coli (STEC) NOT DETECTED NOT DETECTED Final   Shigella/Enteroinvasive E coli (EIEC) NOT DETECTED NOT DETECTED Final   Cryptosporidium NOT DETECTED NOT DETECTED Final   Cyclospora cayetanensis NOT DETECTED NOT DETECTED Final   Entamoeba histolytica NOT DETECTED NOT DETECTED Final   Giardia lamblia NOT DETECTED NOT DETECTED Final   Adenovirus F40/41 NOT DETECTED NOT DETECTED Final   Astrovirus NOT DETECTED NOT DETECTED Final   Norovirus GI/GII NOT DETECTED NOT DETECTED Final   Rotavirus [place] NOT DETECTED NOT DETECTED Final   Sapovirus (I, II, IV, and V) NOT DETECTED NOT DETECTED Final         Radiology Studies: Dg Abd 2 Views  Result Date: 04/12/2017 CLINICAL DATA:  Small bowel obstruction.  EXAM: ABDOMEN - 2 VIEW COMPARISON:  CT of the abdomen and pelvis and abdominal films on 04/10/2017 FINDINGS: No further dilated small bowel loops. Air is  present in nondilated colon. No free air. Nasogastric tube extends into the proximal stomach. IMPRESSION: No evidence of small bowel obstruction or significant ileus. No free air. Electronically Signed   By: Aletta Edouard M.D.   On: 04/12/2017 11:03        Scheduled Meds: . enoxaparin (LOVENOX) injection  40 mg Subcutaneous Q24H   Continuous Infusions: . dextrose 5% lactated ringers with KCl 20 mEq/L 100 mL/hr at 04/14/17 0631  . famotidine (PEPCID) IV 20 mg (04/14/17 0944)  . levofloxacin (LEVAQUIN) IV Stopped (04/14/17 0129)  . metronidazole 500 mg (04/14/17 0944)     LOS: 8 days    Time spent: 30 min    Fayrene Helper, MD [facility] [phone]   If 7PM-7AM, please contact night-coverage [email or web address] Password [email or web address] 04/14/2017, 9:50 AM

## 2017-04-15 ENCOUNTER — Encounter (HOSPITAL_COMMUNITY): Payer: Self-pay | Admitting: *Deleted

## 2017-04-15 ENCOUNTER — Inpatient Hospital Stay (HOSPITAL_COMMUNITY): Payer: BLUE CROSS/BLUE SHIELD | Admitting: Certified Registered Nurse Anesthetist

## 2017-04-15 ENCOUNTER — Encounter (HOSPITAL_COMMUNITY)
Admission: EM | Disposition: A | Discharge status: Home / Self Care | Payer: Self-pay | Source: Home / Self Care | Attending: Family Medicine

## 2017-04-15 DIAGNOSIS — A419 Sepsis, unspecified organism: Principal | ICD-10-CM

## 2017-04-15 HISTORY — PX: COLONOSCOPY WITH PROPOFOL: SHX5780

## 2017-04-15 LAB — BASIC METABOLIC PANEL
Anion gap: 9 (ref 5–15)
BUN: 7 mg/dL (ref 6–20)
CALCIUM: 8.7 mg/dL — AB (ref 8.9–10.3)
CO2: 23 mmol/L (ref 22–32)
CREATININE: 0.81 mg/dL (ref 0.61–1.24)
Chloride: 105 mmol/L (ref 101–111)
Glucose, Bld: 106 mg/dL — ABNORMAL HIGH (ref 65–99)
Potassium: 3.9 mmol/L (ref 3.5–5.1)
SODIUM: 137 mmol/L (ref 135–145)

## 2017-04-15 SURGERY — COLONOSCOPY WITH PROPOFOL
Anesthesia: Monitor Anesthesia Care

## 2017-04-15 MED ORDER — BOOST / RESOURCE BREEZE PO LIQD
1.0000 | Freq: Three times a day (TID) | ORAL | Status: DC
  Administered 2017-04-15: 1 via ORAL

## 2017-04-15 MED ORDER — PROPOFOL 10 MG/ML IV BOLUS
INTRAVENOUS | Status: AC
  Filled 2017-04-15: qty 20

## 2017-04-15 MED ORDER — PROPOFOL 10 MG/ML IV BOLUS
INTRAVENOUS | Status: AC
  Filled 2017-04-15: qty 60

## 2017-04-15 MED ORDER — PROPOFOL 500 MG/50ML IV EMUL
INTRAVENOUS | Status: DC | PRN
  Administered 2017-04-15: 150 ug/kg/min via INTRAVENOUS

## 2017-04-15 MED ORDER — LACTATED RINGERS IV SOLN
INTRAVENOUS | Status: DC
  Administered 2017-04-15 (×2): via INTRAVENOUS

## 2017-04-15 MED ORDER — ONDANSETRON HCL 4 MG/2ML IJ SOLN
INTRAMUSCULAR | Status: AC
  Filled 2017-04-15: qty 2

## 2017-04-15 MED ORDER — PROPOFOL 10 MG/ML IV BOLUS
INTRAVENOUS | Status: DC | PRN
  Administered 2017-04-15 (×3): 40 mg via INTRAVENOUS
  Administered 2017-04-15: 20 mg via INTRAVENOUS

## 2017-04-15 MED ORDER — LIDOCAINE 2% (20 MG/ML) 5 ML SYRINGE
INTRAMUSCULAR | Status: AC
  Filled 2017-04-15: qty 5

## 2017-04-15 SURGICAL SUPPLY — 21 items

## 2017-04-15 NOTE — Anesthesia Postprocedure Evaluation (Signed)
Anesthesia Post Note  Patient: [name]  Procedure(s) Performed: Procedure(s) (LRB): COLONOSCOPY WITH PROPOFOL (N/A)     Patient location during evaluation: PACU Anesthesia Type: MAC Level of consciousness: awake and alert Pain management: pain level controlled Vital Signs Assessment: post-procedure vital signs reviewed and stable Respiratory status: spontaneous breathing Cardiovascular status: stable Anesthetic complications: no    Last Vitals:  Vitals:   04/15/17 1440 04/15/17 1510  BP: (!) 107/53 116/85  Pulse: 92 65  Resp: 17   Temp: 36.7 C (!) 36.3 C  SpO2: 100% 100%    Last Pain:  Vitals:   04/15/17 1510  TempSrc: Oral  PainSc:                  Nolon Nations

## 2017-04-15 NOTE — Anesthesia Preprocedure Evaluation (Signed)
Anesthesia Evaluation  Patient identified by MRN, date of birth, ID band Patient awake    Reviewed: Allergy & Precautions, NPO status , Patient's Chart, lab work & pertinent test results  Airway Mallampati: II  TM Distance: >3 FB Neck ROM: Full    Dental no notable dental hx.    Pulmonary neg pulmonary ROS,    Pulmonary exam normal breath sounds clear to auscultation       Cardiovascular negative cardio ROS Normal cardiovascular exam Rhythm:Regular Rate:Normal     Neuro/Psych negative neurological ROS  negative psych ROS   GI/Hepatic negative GI ROS, Neg liver ROS,   Endo/Other  negative endocrine ROS  Renal/GU negative Renal ROS     Musculoskeletal negative musculoskeletal ROS (+)   Abdominal   Peds  Hematology negative hematology ROS (+)   Anesthesia Other Findings   Reproductive/Obstetrics negative OB ROS                             Anesthesia Physical  Anesthesia Plan  ASA: I  Anesthesia Plan: MAC   Post-op Pain Management:    Induction: Intravenous  PONV Risk Score and Plan:   Airway Management Planned:   Additional Equipment:   Intra-op Plan:   Post-operative Plan:   Informed Consent: I have reviewed the patients History and Physical, chart, labs and discussed the procedure including the risks, benefits and alternatives for the proposed anesthesia with the patient or authorized representative who has indicated his/her understanding and acceptance.   Dental advisory given  Plan Discussed with: CRNA  Anesthesia Plan Comments:         Anesthesia Quick Evaluation

## 2017-04-15 NOTE — Transfer of Care (Signed)
Immediate Anesthesia Transfer of Care Note  Patient: [name]  Procedure(s) Performed: Procedure(s): COLONOSCOPY WITH PROPOFOL (N/A)  Patient Location: PACU  Anesthesia Type:MAC  Level of Consciousness:  sedated, patient cooperative and responds to stimulation  Airway & Oxygen Therapy:Patient Spontanous Breathing and Patient connected to face mask oxgen  Post-op Assessment:  Report given to PACU RN and Post -op Vital signs reviewed and stable  Post vital signs:  Reviewed and stable  Last Vitals:  Vitals:   04/15/17 0835 04/15/17 1220  BP: 126/77 (!) 141/91  Pulse: 79 85  Resp: 14 (!) 23  Temp: 36.6 C 37.1 C  SpO2: 15% 18%    Complications: No apparent anesthesia complications

## 2017-04-15 NOTE — Progress Notes (Signed)
[facility]:  [phone] General Surgery Progress Note   LOS: 9 days  POD -     Chief Complaint: Abdominal pain- resolved  Assessment and Plan: 1.  Recurrent abdominal abscess/fluid (admitted 04/05/2017)  Discussed with Dr. Adela Lank on 9/22..  IR does not think that the fluid is amendable to IR drainage and they think that fluid is probably not abscess.    WBC - 6,300 - 04/12/2017  Flagyl/Levaquin  Pain is resolved and he feels hungry   2.  Odd story - went through some type of small bowel perforation and resection in February, 2018  Says that he did not really feel like he was better until July.  Current event came on suddenly last /17/18 0600  aztreonam (AZACTAM) 2 g in dextrose 5 % 50 mL IVPB  Status:  Discontinued     2 g 100 mL/hr over 30 Minutes Intravenous Every 8 hours 04/06/17 0509 04/06/17 1003   04/06/17 0600  vancomycin (VANCOCIN) IVPB 1000 mg/200 mL premix  Status:  Discontinued     1,000 mg 200 mL/hr over 60 Minutes Intravenous Every 8 hours 04/06/17 0509 04/06/17 1004   04/06/17 0030  vancomycin (VANCOCIN) IVPB 1000 mg/200 mL premix     1,000 mg 200 mL/hr over 60 Minutes Intravenous  Once 04/06/17 0019 04/06/17 0208   04/06/17 0030  aztreonam (AZACTAM) 2 g in dextrose 5 % 50 mL IVPB     2 g 100 mL/hr over 30 Minutes Intravenous  Once 04/06/17 0019 04/06/17 0208   04/06/17 0030  levofloxacin (LEVAQUIN) IVPB 750 mg     750 mg 100 mL/hr over 90 Minutes Intravenous  Once 04/06/17 0019 04/06/17 0208      Berna Bue MD [facility]: [phone] 04/15/2017

## 2017-04-15 NOTE — Progress Notes (Signed)
Patient ID: [name], male   DOB: [birth year], 36 y.o.   MRN: [id number]      Progress Note   Subjective   Tolerated prep without difficulty - passing yellow liquid stoll - no significant increase in abdominal pain with prep , no vomiting.   has not been taking pain meds past 24 hrs - says he feels a lot better.   Objective   Vital signs in last 24 hours: Temp:  [97.9 F (36.6 C)-98.5 F (36.9 C)] 97.9 F (36.6 C) (09/26 0835) Pulse Rate:  [78-86] 79 (09/26 0835) Resp:  [14-18] 14 (09/26 0835) BP: (120-129)/(74-85) 126/77 (09/26 0835) SpO2:  [95 %-97 %] 97 % (09/26 0835) Last BM Date: 04/15/17 (bowel prep given for colonoscopy ) General:    white male  in NAD Heart:  Regular rate and rhythm; no murmurs Lungs: Respirations even and unlabored, lungs CTA bilaterally Abdomen:  Soft, min tender RLQ and nondistended. Normal bowel sounds. Extremities:  Without edema. Neurologic:  Alert and oriented,  grossly normal neurologically. Psych:  Cooperative. Normal mood and affect.  Intake/Output from previous day: 09/25 0701 - 09/26 0700 In: 2893.3 [I.V.:2493.3; IV Piggyback:400] Out: -  Intake/Output this shift: No intake/output data recorded.  Lab Results:  Recent Labs  04/13/17 0457  WBC 5.9  HGB 13.2  HCT 37.5*  PLT 214   BMET  Recent Labs  04/13/17 0457 04/14/17 0443 04/15/17 0453  NA 139 136 137  K 3.8 3.9 3.9  CL 107 107 105  CO2 GLUCOSE 107* 101* 106*  BUN CREATININE 0.77 0.77 0.81  CALCIUM 8.5* 8.5* 8.7*   LFT No results for input(s): PROT, ALBUMIN, AST, ALT, ALKPHOS, BILITOT, BILIDIR, IBILI in the last 72 hours. PT/INR No results for input(s): LABPROT, INR in the last 72 hours.  Studies/Results: No results found.   IMP/PLAN:  #1 36 yo WM  With history of small bowel obstruction, enteritis and associated abdominal abscesses status post ileocecectomy March 2018. The operative pathology not consistent with Crohn's at that  time. Now readmitted with abdominal pain acutely in the right lower quadrant and CT concerning for enteritis/ileitis and suspected fluid collection/abscess along with thickening of the distal small bowel. CT also raised question of possible sigmoid diverticulitis though clinically this seems less likely.  He has improved significantly with bowel rest and IV antibiotics. He has tolerated bowel prep without difficulty, has no evidence of recurrent obstruction. He is scheduled for colonoscopy later today to assess/reassess for possibility of Crohn's. Await findings.   Discharge Planning    Contact  [name], [name]               [phone]      Principal Problem:   Sepsis [name]) Active Problems:   Colitis   Abnormal CT of the abdomen   Abdominal pain     LOS: 9 days   [name]  04/15/2017, 9:32 AM

## 2017-04-15 NOTE — Progress Notes (Signed)
Patient ID: [name], male   DOB: Oct 17, 1980, 36 y.o.   MRN: [id number]  PROGRESS NOTE    [name]  WUJ:[id number] DOB: 09/19/1980 DOA: 04/05/2017  PCP: Donita Brooks, MD   Brief Narrative:  36 year old male with recurrent pelvic abscess and perforate viscus s/p ileocecectomy in early 2018. Pt presented to ED with abd pain N/V without diarrhea. CT scan showed inflammatory changes in small bowel with possible ileus, colitis, no abscess. Lactic acid was 4.3. Pt on empiric abx.   Assessment & Plan:   Sepsis secondary to colitis  SBO -has history of recurrent pelvic abscess and perforate viscus - S/P ileocecectomy early in 2018 - Plan for colonoscopy today per GI - Surgery following  - C.diff negative  - GI panel negative - Continue Levaquin and flagyl  Hemoccult Positive NG tube output - Likely from trauma from NG tube placement versus gastritis, PUD - Stable hgb  Hypokalemia - Due to GI losses - Supplemented     DVT prophylaxis: Lovenox Code Status: full code  Family Communication: no family at the bedside  Disposition Plan: Colonoscopy today    Consultants:   Surgery  GI  Procedures:   None   Antimicrobials:   Levaquin -->  Flagyl -->   Subjective: No overnight events.  Objective: Vitals:   04/14/17 2050 04/15/17 0602 04/15/17 0835 04/15/17 1220  BP: 129/74 122/85 126/77 (!) 141/91  Pulse: 78 78 79 85  Resp: (!) 23  Temp: 98 F (36.7 C) 97.9 F (36.6 C) 97.9 F (36.6 C) 98.8 F (37.1 C)  TempSrc: Oral Oral Oral Oral  SpO2: 95% 97% 97% 99%  Weight:      Height:        Intake/Output Summary (Last 24 hours) at 04/15/17 1320 Last data filed at 04/15/17 1127  Gross per 24 hour  Intake          3338.34 ml  Output                0 ml  Net          3338.34 ml   Filed Weights   04/05/17 2033 04/06/17 0338  Weight: 106.6 kg (235 lb) 114 kg (251 lb 5.2 oz)    Examination:  General exam: Appears calm and  comfortable  Respiratory system: Clear to auscultation. Respiratory effort normal. Cardiovascular system: S1 & S2 heard, RRR. No JVD, murmurs, rubs, gallops or clicks. No pedal edema. Gastrointestinal system: Abdomen is nondistended, soft and nontender. No organomegaly or masses felt. Normal bowel sounds heard. Central nervous system: Alert and oriented. No focal neurological deficits. Extremities: Symmetric 5 x 5 power. Skin: No rashes, lesions or ulcers Psychiatry: Judgement and insight appear normal. Mood & affect appropriate.   Data Reviewed: I have personally reviewed following labs and imaging studies  CBC:  Recent Labs Lab 04/09/17 0246 04/10/17 0418 04/11/17 0411 04/12/17 0425 04/13/17 0457  WBC 7.9 7.1 6.8 6.3 5.9  NEUTROABS 5.8 4.5 4.1 3.9  --   HGB 12.8* 12.7* 12.7* 13.0 13.2  HCT 36.7* 36.9* 36.6* 36.9* 37.5*  MCV 90.8 91.1 89.5 88.9 86.8  PLT 179 192 198 202 214   Basic Metabolic Panel:  Recent Labs Lab 04/09/17 0246  04/11/17 1215 04/12/17 0425 04/13/17 0457 04/14/17 0443 04/15/17 0453  NA 141  < > 139 139 139 136 137  K 3.3*  < > 3.3* 3.9 3.8 3.9 3.9  CL 111  < > 108 109 107  107 105  CO2 21*  < > GLUCOSE 92  < > 94 101* 107* 101* 106*  BUN 15  < > CREATININE 0.78  < > 0.66 0.66 0.77 0.77 0.81  CALCIUM 8.1*  < > 8.5* 8.3* 8.5* 8.5* 8.7*  MG 1.7  --   --   --   --  1.9  --   < > = values in this interval not displayed. GFR: Estimated Creatinine Clearance: 169.2 mL/min (by C-G formula based on SCr of 0.81 mg/dL). Liver Function Tests: No results for input(s): AST, ALT, ALKPHOS, BILITOT, PROT, ALBUMIN in the last 168 hours. No results for input(s): LIPASE, AMYLASE in the last 168 hours. No results for input(s): AMMONIA in the last 168 hours. Coagulation Profile: No results for input(s): INR, PROTIME in the last 168 hours. Cardiac Enzymes: No results for input(s): CKTOTAL, CKMB, CKMBINDEX, TROPONINI in the last 168  hours. BNP (last 3 results) No results for input(s): PROBNP in the last 8760 hours. HbA1C: No results for input(s): HGBA1C in the last 72 hours. CBG:  Recent Labs Lab 04/13/17 2043 04/14/17 0111 04/14/17 0805 04/14/17 1704 04/14/17 2338  GLUCAP 94 96 114* 94 104*   Lipid Profile: No results for input(s): CHOL, HDL, LDLCALC, TRIG, CHOLHDL, LDLDIRECT in the last 72 hours. Thyroid Function Tests: No results for input(s): TSH, T4TOTAL, FREET4, T3FREE, THYROIDAB in the last 72 hours. Anemia Panel: No results for input(s): VITAMINB12, FOLATE, FERRITIN, TIBC, IRON, RETICCTPCT in the last 72 hours. Urine analysis:    Component Value Date/Time   COLORURINE YELLOW 04/05/2017 2105   APPEARANCEUR CLEAR 04/05/2017 2105   LABSPEC 1.028 04/05/2017 2105   PHURINE 5.0 04/05/2017 2105   GLUCOSEU NEGATIVE 04/05/2017 2105   HGBUR NEGATIVE 04/05/2017 2105   BILIRUBINUR NEGATIVE 04/05/2017 2105   KETONESUR 5 (A) 04/05/2017 2105   PROTEINUR NEGATIVE 04/05/2017 2105   NITRITE NEGATIVE 04/05/2017 2105   LEUKOCYTESUR NEGATIVE 04/05/2017 2105   Sepsis Labs: (procalcitonin:4,lacticidven:4)   ) Recent Results (from the past 240 hour(s))  Blood Culture (routine x 2)     Status: None   Collection Time: 04/06/17 12:10 AM  Result Value Ref Range Status   Specimen Description BLOOD RIGHT HAND  Final   Special Requests   Final    BOTTLES DRAWN AEROBIC AND ANAEROBIC Blood Culture adequate volume   Culture   Final    NO GROWTH 5 DAYS Performed at [facility], [address], [place], [place] [address]    Report Status 04/11/2017 FINAL  Final  Blood Culture (routine x 2)     Status: None   Collection Time: 04/06/17 12:23 AM  Result Value Ref Range Status   Specimen Description BLOOD LEFT FOREARM  Final   Special Requests IN PEDIATRIC BOTTLE Blood Culture adequate volume  Final   Culture   Final    NO GROWTH 5 DAYS Performed at [facility], [address]  [place], [place] [address]    Report Status 04/11/2017 FINAL  Final  C difficile quick scan w PCR reflex     Status: None   Collection Time: 04/08/17  8:24 AM  Result Value Ref Range Status   C Diff antigen NEGATIVE NEGATIVE Final   C Diff toxin NEGATIVE NEGATIVE Final   C Diff interpretation No C. difficile detected.  Final  Gastrointestinal Panel by PCR , Stool     Status: None  Collection Time: 04/09/17  6:39 PM  Result Value Ref Range Status   Campylobacter species NOT DETECTED NOT DETECTED Final   Plesimonas shigelloides NOT DETECTED NOT DETECTED Final   Salmonella species NOT DETECTED NOT DETECTED Final   Yersinia enterocolitica NOT DETECTED NOT DETECTED Final   Vibrio species NOT DETECTED NOT DETECTED Final   Vibrio cholerae NOT DETECTED NOT DETECTED Final   Enteroaggregative E coli (EAEC) NOT DETECTED NOT DETECTED Final   Enteropathogenic E coli (EPEC) NOT DETECTED NOT DETECTED Final   Enterotoxigenic E coli (ETEC) NOT DETECTED NOT DETECTED Final   Shiga like toxin producing E coli (STEC) NOT DETECTED NOT DETECTED Final   Shigella/Enteroinvasive E coli (EIEC) NOT DETECTED NOT DETECTED Final   Cryptosporidium NOT DETECTED NOT DETECTED Final   Cyclospora cayetanensis NOT DETECTED NOT DETECTED Final   Entamoeba histolytica NOT DETECTED NOT DETECTED Final   Giardia lamblia NOT DETECTED NOT DETECTED Final   Adenovirus F40/41 NOT DETECTED NOT DETECTED Final   Astrovirus NOT DETECTED NOT DETECTED Final   Norovirus GI/GII NOT DETECTED NOT DETECTED Final   Rotavirus A NOT DETECTED NOT DETECTED Final   Sapovirus (I, II, IV, and V) NOT DETECTED NOT DETECTED Final      Radiology Studies: Dg Abd 2 Views  Result Date: 04/12/2017 CLINICAL DATA:  Small bowel obstruction. EXAM: ABDOMEN - 2 VIEW COMPARISON:  CT of the abdomen and pelvis and abdominal films on 04/10/2017 FINDINGS: No further dilated small bowel loops. Air is present in nondilated colon. No free air. Nasogastric tube extends  into the proximal stomach. IMPRESSION: No evidence of small bowel obstruction or significant ileus. No free air. Electronically Signed   By: Irish Lack M.D.   On: 04/12/2017 11:03     Scheduled Meds: . [MAR Hold] enoxaparin (LOVENOX) injection  40 mg Subcutaneous Q24H  . [MAR Hold] feeding supplement  1 Container Oral TID BM   Continuous Infusions: . dextrose 5% lactated ringers with KCl 20 mEq/L 100 mL/hr at 04/15/17 0459  . [MAR Hold] famotidine (PEPCID) IV Stopped (04/15/17 1015)  . lactated ringers 20 mL/hr at 04/15/17 1230  . [MAR Hold] levofloxacin (LEVAQUIN) IV Stopped (04/14/17 2356)  . [MAR Hold] metronidazole Stopped (04/15/17 1134)     LOS: 9 days    Time spent: 25 minutes  Greater than 50% of the time spent on counseling and coordinating the care.   Manson Passey, MD [facility] Pager [phone]  If 7PM-7AM, please contact night-coverage [email or web address] Password [email or web address] 04/15/2017, 1:20 PM

## 2017-04-15 NOTE — H&P (View-Only) (Signed)
Patient ID: [name], male   DOB: 11/28/1980, 36 y.o.   MRN: [id number]      Progress Note   Subjective   Tolerated prep without difficulty - passing yellow liquid stoll - no significant increase in abdominal pain with prep , no vomiting.   has not been taking pain meds past 24 hrs - says he feels a lot better.   Objective   Vital signs in last 24 hours: Temp:  [97.9 F (36.6 C)-98.5 F (36.9 C)] 97.9 F (36.6 C) (09/26 0835) Pulse Rate:  [78-86] 79 (09/26 0835) Resp:  [14-18] 14 (09/26 0835) BP: (120-129)/(74-85) 126/77 (09/26 0835) SpO2:  [95 %-97 %] 97 % (09/26 0835) Last BM Date: 04/15/17 (bowel prep given for colonoscopy ) General:    white male  in NAD Heart:  Regular rate and rhythm; no murmurs Lungs: Respirations even and unlabored, lungs CTA bilaterally Abdomen:  Soft, min tender RLQ and nondistended. Normal bowel sounds. Extremities:  Without edema. Neurologic:  Alert and oriented,  grossly normal neurologically. Psych:  Cooperative. Normal mood and affect.  Intake/Output from previous day: 09/25 0701 - 09/26 0700 In: 2893.3 [I.V.:2493.3; IV Piggyback:400] Out: -  Intake/Output this shift: No intake/output data recorded.  Lab Results:  Recent Labs  04/13/17 0457  WBC 5.9  HGB 13.2  HCT 37.5*  PLT 214   BMET  Recent Labs  04/13/17 0457 04/14/17 0443 04/15/17 0453  NA 139 136 137  K 3.8 3.9 3.9  CL 107 107 105  CO2 23 22 23  GLUCOSE 107* 101* 106*  BUN 11 8 7  CREATININE 0.77 0.77 0.81  CALCIUM 8.5* 8.5* 8.7*   LFT No results for input(s): PROT, ALBUMIN, AST, ALT, ALKPHOS, BILITOT, BILIDIR, IBILI in the last 72 hours. PT/INR No results for input(s): LABPROT, INR in the last 72 hours.  Studies/Results: No results found.   IMP/PLAN:  #1 36 yo WM  With history of small bowel obstruction, enteritis and associated abdominal abscesses status post ileocecectomy March 2018. The operative pathology not consistent with Crohn's at that  time. Now readmitted with abdominal pain acutely in the right lower quadrant and CT concerning for enteritis/ileitis and suspected fluid collection/abscess along with thickening of the distal small bowel. CT also raised question of possible sigmoid diverticulitis though clinically this seems less likely.  He has improved significantly with bowel rest and IV antibiotics. He has tolerated bowel prep without difficulty, has no evidence of recurrent obstruction. He is scheduled for colonoscopy later today to assess/reassess for possibility of Crohn's. Await findings.   Discharge Planning    Contact  [name], P.A.-C               [phone]      Principal Problem:   Sepsis (HCC) Active Problems:   Colitis   Abnormal CT of the abdomen   Abdominal pain     LOS: 9 days   [name]  04/15/2017, 9:32 AM   

## 2017-04-15 NOTE — Op Note (Signed)
[facility] Patient Name: [name] Procedure Date: 04/15/2017 MRN: [id number] Attending MD: Carlota Raspberry. Merial Moritz MD, MD Date of Birth: [birth year] CSN: [id number] Age: 36 Admit Type: Inpatient Procedure:                Colonoscopy Indications:              Abnormal CT of the GI tract, h/o enteritis, h/o                            diverticulitis, rule out Crohn's disease Providers:                Carlota Raspberry. Reade Trefz MD, MD, Carmie End, RN,                            Cherylynn Ridges, Technician Referring MD:              Medicines:                Monitored Anesthesia Care Complications:            No immediate complications. Estimated blood loss:                            Minimal. Estimated Blood Loss:     Estimated blood loss was minimal. Procedure:                Pre-Anesthesia Assessment:                           - Prior to the procedure, a History and Physical                            was performed, and patient medications and                            allergies were reviewed. The patient's tolerance of                            previous anesthesia was also reviewed. The risks                            and benefits of the procedure and the sedation                            options and risks were discussed with the patient.                            All questions were answered, and informed consent                            was obtained. Prior Anticoagulants: The patient has                            taken no previous anticoagulant or antiplatelet  agents. ASA Grade Assessment: III - A patient with                            severe systemic disease. After reviewing the risks                            and benefits, the patient was deemed in                            satisfactory condition to undergo the procedure.                           After obtaining informed consent, the colonoscope                            was  passed under direct vision. Throughout the                            procedure, the patient's blood pressure, pulse, and                            oxygen saturations were monitored continuously. The                            EC-[id number] ([id number]) scope was introduced through                            the anus and advanced to the the terminal ileum.                            The colonoscopy was performed without difficulty.                            The patient tolerated the procedure well. The                            quality of the bowel preparation was fair. The                            terminal ileum, surgical anastomosis, and the                            rectum were photographed. Scope In: 1:59:22 PM Scope Out: 2:29:04 PM Scope Withdrawal Time: 0 hours 21 minutes 10 seconds  Total Procedure Duration: 0 hours 29 minutes 42 seconds  Findings:      The perianal and digital rectal examinations were normal.      The terminal ileum appeared normal. The ileum was not as deeply       intubated as I would have preferred due to angulated anastomosis and       looping, but I suspect up to 10cm was visualized. Biopsies were taken       with a cold forceps for histology.      There was evidence of a prior end-to-end ileo-colonic anastomosis in the  ascending colon. This was patent and was characterized by healthy       appearing mucosa. The entrance to the ileum was a bit angulated and       challenging to intubate.      Many small and large-mouthed diverticula were found in the left colon.      The exam was otherwise without abnormality on direct and retroflexion       views. No inflammatory changes.      Biopsies for histology were taken with a cold forceps from the right       colon, left colon and transverse colon for evaluation of microscopic       colitis. Impression:               - Preparation of the colon was fair, small or flat                            polyps may not  have been appreciated on this exam.                           - The examined portion of the ileum was normal.                            Biopsied.                           - Patent end-to-end ileo-colonic anastomosis,                            characterized by healthy appearing mucosa.                           - Many diverticuli in the left colon.                           - The examination was otherwise normal on direct                            and retroflexion views.                           - Biopsies were taken with a cold forceps from the                            right colon, left colon and transverse colon for                            evaluation of microscopic colitis.                           Overall, no overt evidence of Crohns in the colon                            and distal ileum. Moderate Sedation:      No moderate sedation, case performed with MAC Recommendation:           - Return patient to hospital  ward for ongoing care.                           - Advance to full liquid diet as tolerated.                           - Continue present medications.                           - Await pathology results                           - GI service will continue to follow Procedure Code(s):        --- Professional ---                           [phone], Colonoscopy, flexible; with biopsy, single                            or multiple Diagnosis Code(s):        --- Professional ---                           Z98.0, Intestinal bypass and anastomosis status                           K57.30, Diverticulosis of large intestine without                            perforation or abscess without bleeding                           R93.3, Abnormal findings on diagnostic imaging of                            other parts of digestive tract CPT copyright 2016 American [employer] Association. All rights reserved. The codes documented in this report are preliminary and upon coder review may  be  revised to meet current compliance requirements. Remo Lipps P. Reyli Schroth MD, MD 04/15/2017 2:38:12 PM This report has been signed electronically. Number of Addenda: 0

## 2017-04-15 NOTE — Progress Notes (Signed)
Initial Nutrition Assessment  DOCUMENTATION CODES:   Obesity unspecified  INTERVENTION:   -Monitor for diet advancement/toleration -Boost Breeze po TID, each supplement provides 250 kcal and 9 grams of protein  NUTRITION DIAGNOSIS:   Inadequate oral intake related to altered GI function as evidenced by energy intake < or equal to 50% for > or equal to 5 days.  GOAL:   Patient will meet greater than or equal to 90% of their needs  MONITOR:   PO intake, Supplement acceptance, Diet advancement, Labs, Weight trends  REASON FOR ASSESSMENT:   LOS, NPO/Clear Liquid Diet    ASSESSMENT:   Pt with PMH significant for recurrent pelvic abscess and perforated viscus. Underwent laparoscopic ileocecectomy 10/16/16. Presents to ED with c/o of nausea vomiting. Admitted for sepsis secondary to colitis. Pt scheduled for colonoscopy 9/26 to for suspected inflammatory small bowel process.  Spoke with pt at bedside. Pt reports having great appetite prior to admission until Sunday 9/16. Pt experienced vomiting and nausea. Was placed on NGT. Diet advancements since beginning of admission: 9/16-NPO 9/24-clear liquid 9/26 NPO for procedure  Pt has not been upgraded further than clear liquids in 9 days. RD to provide supplementation once diet is advanced post procedure. Wt noted to be stable. Pt reports no unintentional wt loss. Nutrition-Focused physical exam completed. Findings are no fat depletion, no muscle depletion, and no edema.   Medications reviewed and include: LR with KCl and D5 @ 100 ml/hr, IV abx Labs reviewed  Diet Order:  Diet NPO time specified Except for: Other (See Comments)  Skin:  Reviewed, no issues  Last BM:  04/15/17  Height:   Ht Readings from Last 1 Encounters:  04/06/17  (1.88 m)    Weight:   Wt Readings from Last 1 Encounters:  04/06/17 251 lb 5.2 oz (114 kg)    Ideal Body Weight:  86.3 kg  BMI:  Body mass index is 32.27 kg/m.  Estimated Nutritional  Needs:   Kcal:  2142 (MSJ)  Protein:  105-115 grams (1.2-1.3 g/kg IBW)  Fluid:  >2.1 L/day  EDUCATION NEEDS:   Education needs addressed  Vanessa Kick RD, LDN Clinical Nutrition Pager # [phone]

## 2017-04-15 NOTE — Interval H&P Note (Signed)
History and Physical Interval Note:  04/15/2017 12:43 PM  [name]  has presented today for surgery, with the diagnosis of abnormal ct abdomen  The various methods of treatment have been discussed with the patient and family. After consideration of risks, benefits and other options for treatment, the patient has consented to  Procedure(s): COLONOSCOPY WITH PROPOFOL (N/A) as a surgical intervention .  The patient's history has been reviewed, patient examined, no change in status, stable for surgery.  I have reviewed the patient's chart and labs.  Questions were answered to the patient's satisfaction.     Viviann Spare P Armbruster

## 2017-04-16 ENCOUNTER — Encounter (HOSPITAL_COMMUNITY): Payer: Self-pay | Admitting: Gastroenterology

## 2017-04-16 MED ORDER — LEVOFLOXACIN 750 MG PO TABS: 750.0000 mg | ORAL_TABLET | Freq: Every day | ORAL | 0 refills | Status: AC

## 2017-04-16 MED ORDER — FAMOTIDINE 20 MG PO TABS: 20.0000 mg | ORAL_TABLET | Freq: Two times a day (BID) | ORAL | 0 refills | Status: DC

## 2017-04-16 MED ORDER — BOOST / RESOURCE BREEZE PO LIQD: 1.0000 | Freq: Three times a day (TID) | ORAL | 0 refills | Status: DC

## 2017-04-16 MED ORDER — METRONIDAZOLE 500 MG PO TABS: 500.0000 mg | ORAL_TABLET | Freq: Three times a day (TID) | ORAL | 0 refills | Status: AC

## 2017-04-16 MED ORDER — FAMOTIDINE 20 MG PO TABS
20.0000 mg | ORAL_TABLET | Freq: Two times a day (BID) | ORAL | Status: DC
  Administered 2017-04-16: 20 mg via ORAL
  Filled 2017-04-16: qty 1

## 2017-04-16 NOTE — Discharge Summary (Signed)
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 Pt presented to ED with abd pain N/V without diarrhea. CT scan showed inflammatory changes in small bowel with possible ileus, colitis, no abscess. Lactic acid was 4.3. Pt on empiric abx.  Hospital Course:   Sepsis secondary to colitis  SBO - has history of recurrent pelvic abscess and perforate viscus - S/P ileocecectomy early in 2018 - C.diff negative  - GI panel negative - Patient had colonoscopy 04/15/2017, findings of multiple diverticuli in the left colon, biopsies taken - Plan to continue Levaquin and Flagyl for total of 14 days including with patient already is received in the hospital which would leave 4 more days of antibiotics on discharge  Hemoccult Positive NG tube output - Likely from trauma from NG tube placement versus gastritis, PUD - Stable   Hypokalemia - Due to GI losses - Supplemented      DVT prophylaxis: Lovenox sub Code Status: full code  Family Communication: no family at the bedside     Consultants:   Surgery  GI  Procedures:   Colonoscopy 9/26 - multiple diverticuli in left colon, biopsies taken  Antimicrobials:   Levaquin --> for 4 days on discharge   Flagyl --> for 4 days on discharge    Signed:  Manson Passey, MD  [facility] 04/16/2017, 9:24 AM  Pager #: [phone]  Time spent in minutes: more than 30  minutes    Discharge Exam: Vitals:   04/16/17 0038 04/16/17 0521  BP: 104/62 112/76  Pulse: 82 69  Resp: 18 18  Temp: 98 F (36.7 C) 98.8 F (37.1 C)  SpO2: 98% 98%   Vitals:   04/15/17 1510 04/15/17 2030 04/16/17 0038 04/16/17 0521  BP: 116/85 120/71 104/62 112/76  Pulse: 65 85 82 69  Resp:   18 18  Temp: (!) 97.4 F (36.3 C) 98 F (36.7 C) 98 F (36.7 C) 98.8 F (37.1 C)  TempSrc: Oral Oral Oral Oral  SpO2: 100% 97% 98% 98%  Weight:      Height:        General: Pt is alert, follows commands appropriately, not in acute distress Cardiovascular: Regular rate and rhythm, S1/S2 +, no murmurs Respiratory: Clear to auscultation bilaterally, no wheezing, no crackles, no rhonchi Abdominal: Soft, non tender, non distended, bowel sounds +, no guarding Extremities: no edema, no cyanosis, pulses palpable bilaterally DP and PT Neuro: Grossly nonfocal  Discharge Instructions  Discharge Instructions    Call MD for:  persistant nausea and vomiting    Complete by:  As directed    Call MD for:  redness, tenderness, or signs of infection (pain, swelling, redness, odor or green/yellow discharge around incision site)    Complete by:  As directed    Call MD for:  severe uncontrolled pain    Complete by:  As directed    Diet - low sodium heart healthy    Complete by:  As directed  Discharge instructions    Complete by:  As directed       Increase activity slowly    Complete by:  As directed      Allergies as of 04/16/2017      Reactions   Keflex [cephalexin] Anaphylaxis, Rash   Penicillins Other (See Comments)   Childhood Has patient had a PCN reaction causing immediate rash, facial/tongue/throat swelling, SOB or lightheadedness with hypotension: uknown Has patient had a PCN reaction causing severe rash involving mucus membranes or skin necrosis: unknown Has patient had a PCN reaction that required hospitalization unknown Has patient had a PCN reaction occurring within the  last 10 years: uknown If all of the above answers are "NO", then may proceed with Cephalosporin use.   Sulfa Antibiotics Other (See Comments)   Unknown. Childhood Allergy      Medication List    STOP taking these medications   oxyCODONE-acetaminophen 5-325 MG tablet Commonly known as:  PERCOCET/ROXICET     TAKE these medications   acetaminophen 325 MG tablet Commonly known as:  TYLENOL Take 2 tablets (650 mg total) by mouth every 6 (six) hours as needed for mild pain or headache.   famotidine 20 MG tablet Commonly known as:  PEPCID Take 1 tablet (20 mg total) by mouth 2 (two) times daily.   feeding supplement Liqd Take 1 Container by mouth 3 (three) times daily between meals.   levofloxacin 750 MG tablet Commonly known as:  LEVAQUIN Take 1 tablet (750 mg total) by mouth daily.   loratadine 10 MG tablet Commonly known as:  CLARITIN Take 10 mg by mouth daily.   metroNIDAZOLE 500 MG tablet Commonly known as:  FLAGYL Take 1 tablet (500 mg total) by mouth 3 (three) times daily.   saccharomyces boulardii 250 MG capsule Commonly known as:  FLORASTOR Take 1 capsule (250 mg total) by mouth 2 (two) times daily.            Discharge Care Instructions        Start     Ordered   04/16/17 0000  famotidine (PEPCID) 20 MG tablet  2 times daily     04/16/17 0924   04/16/17 0000  feeding supplement (BOOST / RESOURCE BREEZE) LIQD  3 times daily between meals     04/16/17 0924   04/16/17 0000  Increase activity slowly     04/16/17 0924   04/16/17 0000  Diet - low sodium heart healthy     04/16/17 0924   04/16/17 0000  Discharge instructions    Comments:  Continue Levaquin and flagyl for 3 days on discharge   04/16/17 8119   04/16/17 0000  Call MD for:  persistant nausea and vomiting     04/16/17 0924   04/16/17 0000  Call MD for:  severe uncontrolled pain     04/16/17 0924   04/16/17 0000  Call MD for:  redness, tenderness, or signs of infection (pain, swelling, redness,  odor or green/yellow discharge around incision site)     04/16/17 0924   04/16/17 0000  levofloxacin (LEVAQUIN) 750 MG tablet  Daily     04/16/17 0924   04/16/17 0000  metroNIDAZOLE (FLAGYL) 500 MG tablet  3 times daily     04/16/17 1478     Follow-up Information    Donita Brooks, MD. Schedule an appointment as soon as possible for a visit in 1 week(s).   Specialty:  Family Medicine Contact information: [phone] [place]  [place] [address] [phone]            The results of significant diagnostics from this hospitalization (including imaging, microbiology, ancillary and laboratory) are listed below for reference.    Significant Diagnostic Studies: Ct Abdomen Pelvis W Contrast  Result Date: 04/10/2017 CLINICAL DATA:  Abdominal pain. History of ileocecectomy. Evaluate for gastroenteritis or colitis versus small-bowel obstruction. EXAM: CT ABDOMEN AND PELVIS WITH CONTRAST TECHNIQUE: Multidetector CT imaging of the abdomen and pelvis was performed using the standard protocol following bolus administration of intravenous contrast. CONTRAST:  ISOVUE-300 IOPAMIDOL (ISOVUE-300) INJECTION 61% COMPARISON:  CT abdomen pelvis - 04/06/2017 ; 12/05/2016; 09/09/2016; 08/20/2016; CT-guided percutaneous drainage catheter placement- 08/23/2016; 08/21/2016 FINDINGS: Lower chest: Limited visualization of the lower thorax demonstrates subsegmental atelectasis/ scar within the bilateral lower lobes. Trace bilateral pleural effusions. No focal airspace opacities. Normal heart size.  No pericardial effusion. Hepatobiliary: Normal hepatic contour. There is diffuse decreased attenuation of the hepatic parenchyma on this postcontrast examination suggestive of hepatic steatosis. There are slightly ill-defined hyperenhancing lesions within the dome of the right lobe of the liver (coronal image 45, series 5) as well as within the central aspect of the will medial segment and 2.5 x 1.2 cm respectively,  incompletely characterize though similar to previous examinations and favored to represent perfusional anomaly is versus flash filling hemangiomas. There is increased attenuation within the gallbladder fossa suggestive of either vicarious excretion of contrast (favored) versus biliary sludge. No definitive gallbladder wall thickening. There is a small amount of fluid about the caudal aspect the right lobe of the liver. Pancreas: Normal appearance of the pancreas Spleen: Normal appearance of the spleen Adrenals/Urinary Tract: There is symmetric enhancement of the bilateral kidneys. No definite renal stones this postcontrast examination. No discrete renal lesions. There is a minimal amount of grossly symmetric likely body habitus related bilateral perinephric stranding. No urine obstruction. Normal appearance the bilateral adrenal glands. Normal appearance of the urinary bladder given underdistention. Stomach/Bowel: Enteric tubes terminates with the mid body of the stomach. Stable sequela of prior ileocecectomy. Enteric contrast extensive the level of the cecum, however there is bowel wall thickening involving multiple loops of distal small bowel within the right lower abdominal quadrant (images 66 and 67, series 2). Circumferential bowel wall thickening involving a more proximal segment of small bowel (axial image 54, series 2, coronal image 46, series 5). Extensive colonic diverticulosis. A diverticuli extends into the caudal most aspect of the inflammatory process within the right lower abdominal quadrant (coronal image 49, series 5). This finding is associated with several tiny foci of adjacent extraluminal air (representative images 69 and 70, series 2) and may communicate with the adjacent serpiginous fluid collection within the root of the abdominal mesenteries measuring approximately 11.6 x 6.6 x 1.1 cm (axial image 59, series 2; coronal image 50, series 5). No pneumoperitoneum, pneumatosis or portal venous  gas. Colonic diverticulosis without evidence of diverticulitis. Vascular/Lymphatic: Normal caliber of the abdominal aorta. The major branch vessels of the abdominal aorta appear patent on this non CTA examination. Scattered retroperitoneal and mesenteric lymph nodes are numerous though individually not enlarged by size criteria with index mesenteric lymph node within the caudal root of the abdominal mesenteric measuring approximately 0.9 cm in greatest short axis diameter (image 53, series 2). No bulky retroperitoneal, mesenteric, pelvic or inguinal lymphadenopathy. Reproductive: Normal appearance of the prostate gland. No free fluid in the pelvic cul-de-sac. Other: Well-healed midline abdominal incision without evidence of hernia. Musculoskeletal: No acute or aggressive osseous abnormalities.  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 No evidence of sacroiliitis. IMPRESSION: 1. Multiple previous examinations have been reviewed and I am concerned the patient has experienced a recurrent episode of acute diverticulitis secondary to a small diverticulum arising from the anterior aspect of the sigmoid colon. This small diverticulum is associated with several small foci of pneumoperitoneum and appears to contain a serpiginous communication to a developing suspected abscess within the root of the abdominal mesentery measuring approximately 11.6 cm in diameter. 2. Interval progression of circumferential bowel wall thickening involving several loops of mid and distal small bowel, not resulting in enteric obstruction - while potentially secondary to a concomitant inflammatory etiology (such as Crohn's colitis), I favor these loops are secondarily involved due to their proximity to the suspected developing abscess within the root of the abdominal mesentery. 3. Findings suggestive of hepatic steatosis. 4. Grossly unchanged appearance of hyperenhancing hepatic lesions,  incompletely characterized though favored to represent perfusional anomalies versus flash filling hemangiomas, similar previous examinations. 5. Increased attenuation of the intraluminal portion of the gallbladder favored to represent vicarious excretion of contrast from recent contrast-enhanced abdominal CT performed 04/06/2017. Critical Value/emergent results were called by telephone at the time of interpretation on 04/10/2017 at 3:59 pm toBrooke, CCS PA, who verbally acknowledged these results. Electronically Signed   By: Simonne Come M.D.   On: 04/10/2017 16:42   Ct Abdomen Pelvis W Contrast  Result Date: 04/06/2017 CLINICAL DATA:  Abdominal pain with nausea and vomiting history of small bowel or section and perforation EXAM: CT ABDOMEN AND PELVIS WITH CONTRAST TECHNIQUE: Multidetector CT imaging of the abdomen and pelvis was performed using the standard protocol following bolus administration of intravenous contrast. CONTRAST:  100 mL Isovue-300 intravenous COMPARISON:  12/05/2016, 10/13/2016, 08/20/2016 FINDINGS: Lower chest: Lung bases demonstrate patchy dependent atelectasis. Normal heart size. Hepatobiliary: Hepatic steatosis. Stable hyperenhancing focus in the dome of the liver. No calcified gallstones. No biliary dilatation Pancreas: Unremarkable. No pancreatic ductal dilatation or surrounding inflammatory changes. Spleen: Normal in size without focal abnormality. Adrenals/Urinary Tract: Adrenal glands are unremarkable. Kidneys are normal, without renal calculi, focal lesion, or hydronephrosis. Bladder is unremarkable. Stomach/Bowel: The stomach is nonenlarged. Focally dilated segment of fluid-filled small bowel within the central pelvis. Postsurgical changes in the right lower quadrant. Mild wall thickening of the transverse colon. Sigmoid colon diverticula without acute inflammation. Vascular/Lymphatic: No significant vascular findings are present. No enlarged abdominal or pelvic lymph nodes.  Reproductive: Prostate is unremarkable. Other: Negative for free air or free fluid. Moderate infiltration and inflammation within the anterior pelvic mesentery. Inflammatory process within the right lower quadrant/ upper pelvis, poorly defined, appears to be centered around non dilated small bowel loops. Edema and inflammation within the central mesentery. Musculoskeletal: No acute or suspicious bone lesion. Trace retrolisthesis of L5 on S1 IMPRESSION: 1. Moderate inflammatory changes within the abdomen and upper pelvis with poorly defined inflammatory process in the right lower quadrant/upper pelvis. This appears to be centered around non dilated loops of small bowel, suggesting small bowel inflammation, possibly due to inflammatory bowel disease. Dilated segment of small bowel within the upper pelvis could relate to localized ileus. Mild diffuse generalized inflammation within the peritoneal cavity/central mesentery. No free air at this time. No organized abscess. There are mild changes of the transverse colon which could be due to mild colitis. 2. Sigmoid colon diverticular disease. 3. Hepatic steatosis. Stable small hyperenhancing focus in the dome of the liver, possibly a flash  hemangioma. Electronically Signed   By: Jasmine Pang M.D.   On: 04/06/2017 01:21   Dg Abd 2 Views  Result Date: 04/12/2017 CLINICAL DATA:  Small bowel obstruction. EXAM: ABDOMEN - 2 VIEW COMPARISON:  CT of the abdomen and pelvis and abdominal films on 04/10/2017 FINDINGS: No further dilated small bowel loops. Air is present in nondilated colon. No free air. Nasogastric tube extends into the proximal stomach. IMPRESSION: No evidence of small bowel obstruction or significant ileus. No free air. Electronically Signed   By: Irish Lack M.D.   On: 04/12/2017 11:03   Dg Abd 2 Views  Result Date: 04/08/2017 CLINICAL DATA:  Right lower quadrant pain EXAM: ABDOMEN - 2 VIEW COMPARISON:  04/06/2017 FINDINGS: Dilated small bowel loops  with air-fluid levels compatible with small bowel obstruction. No free air organomegaly. Bibasilar atelectasis in the visualized lung bases. IMPRESSION: Dilated small bowel loops with air-fluid levels compatible with small bowel obstruction. Electronically Signed   By: Charlett Nose M.D.   On: 04/08/2017 10:49   Dg Abd Portable 1v  Result Date: 04/10/2017 CLINICAL DATA:  NG tube placement EXAM: PORTABLE ABDOMEN - 1 VIEW COMPARISON:  04/09/2017 FINDINGS: Enteric tube tip is in the left upper quadrant consistent with location in the upper stomach. Proximal side hole projects just distal to the EG junction. Visualized bowel gas pattern is normal. IMPRESSION: Enteric tube tip is in the left upper quadrant consistent with location in the upper stomach. Electronically Signed   By: Burman Nieves M.D.   On: 04/10/2017 02:36   Dg Abd Portable 1v  Result Date: 04/08/2017 CLINICAL DATA:  NG tube placement EXAM: PORTABLE ABDOMEN - 1 VIEW COMPARISON:  04/08/2017, 04/06/2017 FINDINGS: NG tube within the stomach which is collapsed. Dilated small bowel in the mid and left abdomen with loops measuring 4.7 cm in diameter. Findings compatible with small-bowel obstruction. No acute osseous finding. Bibasilar atelectasis. No abnormal calcifications. IMPRESSION: NG tube within the stomach. Persistent small bowel dilatation compatible with obstruction Electronically Signed   By: Judie Petit.  Shick M.D.   On: 04/08/2017 20:39   Dg Abd Portable 2v  Result Date: 04/09/2017 CLINICAL DATA:  Slight increase of mid abdominal pain. EXAM: PORTABLE ABDOMEN - 2 VIEW COMPARISON:  04/08/2017 FINDINGS: The enteric tube tip is in the left upper quadrant consistent with location in the upper stomach. Persistent gaseous distention of small bowel with air-fluid levels consistent with small bowel obstruction. The degree of distention appears to be less than on the previous study. No free intra-abdominal air. No radiopaque stones. Visualized bones  appear intact. IMPRESSION: Gaseous distention of small bowel with air-fluid levels consistent with small bowel obstruction. Degree of distention appears to be improving since previous study. Electronically Signed   By: Burman Nieves M.D.   On: 04/09/2017 03:35    Microbiology: Recent Results (from the past 240 hour(s))  C difficile quick scan w PCR reflex     Status: None   Collection Time: 04/08/17  8:24 AM  Result Value Ref Range Status   C Diff antigen NEGATIVE NEGATIVE Final   C Diff toxin NEGATIVE NEGATIVE Final   C Diff interpretation No C. difficile detected.  Final  Gastrointestinal Panel by PCR , Stool     Status: None   Collection Time: 04/09/17  6:39 PM  Result Value Ref Range Status   Campylobacter species NOT DETECTED NOT DETECTED Final   Plesimonas shigelloides NOT DETECTED NOT DETECTED Final   Salmonella species NOT DETECTED NOT DETECTED Final  Yersinia enterocolitica NOT DETECTED NOT DETECTED Final   Vibrio species NOT DETECTED NOT DETECTED Final   Vibrio cholerae NOT DETECTED NOT DETECTED Final   Enteroaggregative E coli (EAEC) NOT DETECTED NOT DETECTED Final   Enteropathogenic E coli (EPEC) NOT DETECTED NOT DETECTED Final   Enterotoxigenic E coli (ETEC) NOT DETECTED NOT DETECTED Final   Shiga like toxin producing E coli (STEC) NOT DETECTED NOT DETECTED Final   Shigella/Enteroinvasive E coli (EIEC) NOT DETECTED NOT DETECTED Final   Cryptosporidium NOT DETECTED NOT DETECTED Final   Cyclospora cayetanensis NOT DETECTED NOT DETECTED Final   Entamoeba histolytica NOT DETECTED NOT DETECTED Final   Giardia lamblia NOT DETECTED NOT DETECTED Final   Adenovirus F40/41 NOT DETECTED NOT DETECTED Final   Astrovirus NOT DETECTED NOT DETECTED Final   Norovirus GI/GII NOT DETECTED NOT DETECTED Final   Rotavirus A NOT DETECTED NOT DETECTED Final   Sapovirus (I, II, IV, and V) NOT DETECTED NOT DETECTED Final     Labs: Basic Metabolic Panel:  Recent Labs Lab 04/11/17 1215  04/12/17 0425 04/13/17 0457 04/14/17 0443 04/15/17 0453  NA 139 139 139 136 137  K 3.3* 3.9 3.8 3.9 3.9  CL 108 109 107 107 105  CO2 GLUCOSE 94 101* 107* 101* 106*  BUN CREATININE 0.66 0.66 0.77 0.77 0.81  CALCIUM 8.5* 8.3* 8.5* 8.5* 8.7*  MG  --   --   --  1.9  --    Liver Function Tests: No results for input(s): AST, ALT, ALKPHOS, BILITOT, PROT, ALBUMIN in the last 168 hours. No results for input(s): LIPASE, AMYLASE in the last 168 hours. No results for input(s): AMMONIA in the last 168 hours. CBC:  Recent Labs Lab 04/10/17 0418 04/11/17 0411 04/12/17 0425 04/13/17 0457  WBC 7.1 6.8 6.3 5.9  NEUTROABS 4.5 4.1 3.9  --   HGB 12.7* 12.7* 13.0 13.2  HCT 36.9* 36.6* 36.9* 37.5*  MCV 91.1 89.5 88.9 86.8  PLT 192 198 202 214   Cardiac Enzymes: No results for input(s): CKTOTAL, CKMB, CKMBINDEX, TROPONINI in the last 168 hours. BNP: BNP (last 3 results) No results for input(s): BNP in the last 8760 hours.  ProBNP (last 3 results) No results for input(s): PROBNP in the last 8760 hours.  CBG:  Recent Labs Lab 04/13/17 2043 04/14/17 0111 04/14/17 0805 04/14/17 1704 04/14/17 2338  GLUCAP 94 96 114* 94 104*

## 2017-04-16 NOTE — Progress Notes (Signed)
Patient ID: [name], male   DOB: 1981/05/27, 36 y.o.   MRN: [id number]  [facility] Surgery Progress Note  1 Day Post-Op  Subjective: CC- abdominal pain Sitting up in bed this morning, asking when he can go home. He is tolerating solid food and having bowel function. He reports mild lower abdominal pain, but no nausea or vomiting.  Objective: Vital signs in last 24 hours: Temp:  [97.4 F (36.3 C)-98.8 F (37.1 C)] 98.8 F (37.1 C) (09/27 0521) Pulse Rate:  [65-92] 69 (09/27 0521) Resp:  [17-23] 18 (09/27 0521) BP: (104-141)/(53-91) 112/76 (09/27 0521) SpO2:  [97 %-100 %] 98 % (09/27 0521) Last BM Date: 04/15/17 (bowel prep given for colonoscopy )  Intake/Output from previous day: 09/26 0701 - 09/27 0700 In: 3731.7 [P.O.:500; I.V.:2831.7; IV Piggyback:400] Out: 0  Intake/Output this shift: Total I/O In: 600 [P.O.:600] Out: -   PE: Gen:  Alert, NAD, pleasant HEENT: EOM's intact, pupils equal and round Card:  RRR, no M/G/R heard Pulm:  CTAB, no W/R/R, effort normal Abd: Soft, ND, +BS, no HSM, no hernia, mild lower abdominal tenderness Ext:  No erythema, edema, or tenderness BUE/BLE  Psych: A&Ox3  Skin: no rashes noted, warm and dry  Lab Results:  No results for input(s): WBC, HGB, HCT, PLT in the last 72 hours. BMET  Recent Labs  04/14/17 0443 04/15/17 0453  NA 136 137  K 3.9 3.9  CL 107 105  CO2 22 23  GLUCOSE 101* 106*  BUN 8 7  CREATININE 0.77 0.81  CALCIUM 8.5* 8.7*   PT/INR No results for input(s): LABPROT, INR in the last 72 hours. CMP     Component Value Date/Time   NA 137 04/15/2017 0453   K 3.9 04/15/2017 0453   CL 105 04/15/2017 0453   CO2 23 04/15/2017 0453   GLUCOSE 106 (H) 04/15/2017 0453   BUN 7 04/15/2017 0453   CREATININE 0.81 04/15/2017 0453   CREATININE 1.03 08/19/2016 1056   CALCIUM 8.7 (L) 04/15/2017 0453   PROT 5.8 (L) 04/08/2017 0422   ALBUMIN 3.2 (L) 04/08/2017 0422   AST 15 04/08/2017 0422   ALT 27 04/08/2017  0422   ALKPHOS 50 04/08/2017 0422   BILITOT 3.3 (H) 04/08/2017 0422   GFRNONAA >60 04/15/2017 0453   GFRAA >60 04/15/2017 0453   Lipase     Component Value Date/Time   LIPASE 28 04/05/2017 2125       Studies/Results: No results found.  Anti-infectives: Anti-infectives    Start     Dose/Rate Route Frequency Ordered Stop   04/06/17 2200  levofloxacin (LEVAQUIN) IVPB 750 mg     750 mg 100 mL/hr over 90 Minutes Intravenous Every 24 hours 04/06/17 0509     04/06/17 1030  metroNIDAZOLE (FLAGYL) IVPB 500 mg     500 mg 100 mL/hr over 60 Minutes Intravenous Every 8 hours 04/06/17 1006     04/06/17 0600  aztreonam (AZACTAM) 2 g in dextrose 5 % 50 mL IVPB  Status:  Discontinued     2 g 100 mL/hr over 30 Minutes Intravenous Every 8 hours 04/06/17 0509 04/06/17 1003   04/06/17 0600  vancomycin (VANCOCIN) IVPB 1000 mg/200 mL premix  Status:  Discontinued     1,000 mg 200 mL/hr over 60 Minutes Intravenous Every 8 hours 04/06/17 0509 04/06/17 1004   04/06/17 0030  vancomycin (VANCOCIN) IVPB 1000 mg/200 mL premix     1,000 mg 200 mL/hr over 60 Minutes Intravenous  Once 04/06/17 0019  04/06/17 0208   04/06/17 0030  aztreonam (AZACTAM) 2 g in dextrose 5 % 50 mL IVPB     2 g 100 mL/hr over 30 Minutes Intravenous  Once 04/06/17 0019 04/06/17 0208   04/06/17 0030  levofloxacin (LEVAQUIN) IVPB 750 mg     750 mg 100 mL/hr over 90 Minutes Intravenous  Once 04/06/17 0019 04/06/17 0208       Assessment/Plan Recurrent abdominal abscess/fluid (admitted 04/05/2017) - Hx of small bowel microperforation with IR drain 09/09/16 - S/p Diagnostic laparoscopy, drainage of abdominal abscess, and open ileocecectomy, 2/29/18 - Dr. Donell Beers - s/p colonoscopy 9/26, biopsies pending - afebrile, tolerating diet, on levaquin/flagyl  ID - levaquin/flagyl 9/17>>day#11 FEN - soft diet VTE - SCDs, lovenox Foley - none Follow up - GI  Plan - patient feeling well this morning and is anxious to go home. He is  tolerating diet, afebrile, having bowel function. Ok to transition to oral antibiotics and send home today if tolerating abx (discussed with Dr. Elisabeth Pigeon, plan for 14 total days antibiotics). Follow up with GI. Work note on chart.   LOS: 10 days    Franne Forts , [facility] Surgery 04/16/2017, 8:43 AM Pager: [phone] Consults: [phone] Mon-Fri 7:00 am-4:30 pm Sat-Sun 7:00 am-11:30 am

## 2017-04-16 NOTE — Progress Notes (Signed)
Pt given discharge teaching including medications. Pt verbalized understanding of all discharge teaching. Discharge packet with prescriptions with Pt at discharge.

## 2017-04-16 NOTE — Discharge Instructions (Signed)
Levofloxacin tablets °What is this medicine? °LEVOFLOXACIN (lee voe FLOX a sin) is a quinolone antibiotic. It is used to treat certain kinds of bacterial infections. It will not work for colds, flu, or other viral infections. °This medicine may be used for other purposes; ask your health care provider or pharmacist if you have questions. °COMMON BRAND NAME(S): Levaquin, Levaquin Leva-Pak °What should I tell my health care provider before I take this medicine? °They need to know if you have any of these conditions: °-bone problems °-diabetes °-history of low levels of potassium in the blood °-irregular heartbeat °-joint problems °-kidney disease °-liver disease °-myasthenia gravis °-seizures °-tendon problems °-tingling of the fingers or toes, or other nerve disorder °-an unusual or allergic reaction to levofloxacin, other quinolone antibiotics, foods, dyes, or preservatives °-pregnant or trying to get pregnant °-breast-feeding °How should I use this medicine? °Take this medicine by mouth with a full glass of water. Follow the directions on the prescription label. This medicine can be taken with or without food. Take your medicine at regular intervals. Do not take your medicine more often than directed. Do not skip doses or stop your medicine early even if you feel better. Do not stop taking except on your doctor's advice. °A special MedGuide will be given to you by the pharmacist with each prescription and refill. Be sure to read this information carefully each time. °Talk to your pediatrician regarding the use of this medicine in children. While this drug may be prescribed for children as young as 6 months for selected conditions, precautions do apply. °Overdosage: If you think you have taken too much of this medicine contact a poison control center or emergency room at once. °NOTE: This medicine is only for you. Do not share this medicine with others. °What if I miss a dose? °If you miss a dose, take it as soon as  you remember. If it is almost time for your next dose, take only that dose. Do not take double or extra doses. °What may interact with this medicine? °Do not take this medicine with any of the following medications: °-bepridil °-certain medicines for depression, anxiety, or psychotic disturbances like pimozide, thioridazine, and ziprasidone °-certain medicines for irregular heart beat like dofetilide and dronedarone °-cisapride °-halofantrine °This medicine may also interact with the following medications: °-antacids °-birth control pills °-certain medicines for diabetes, like glipizide, glyburide, or insulin °-didanosine buffered tablets or powder °-multivitamins °-NSAIDS, medicines for pain and inflammation, like ibuprofen or naproxen °-steroid medicines like prednisone or cortisone °-sucralfate °-theophylline °-warfarin °This list may not describe all possible interactions. Give your health care provider a list of all the medicines, herbs, non-prescription drugs, or dietary supplements you use. Also tell them if you smoke, drink alcohol, or use illegal drugs. Some items may interact with your medicine. °What should I watch for while using this medicine? °Tell your doctor or healthcare professional if your symptoms do not start to get better or if they get worse. °Do not treat diarrhea with over the counter products. Contact your doctor if you have diarrhea that lasts more than 2 days or if it is severe and watery. °Check with your doctor or health care professional if you get an attack of severe diarrhea, nausea and vomiting, or if you sweat a lot. The loss of too much body fluid can make it dangerous for you to take this medicine. °You may get drowsy or dizzy. Do not drive, use machinery, or do anything that needs mental alertness until you   know how this medicine affects you. Do not sit or stand up quickly, especially if you are an older patient. This reduces the risk of dizzy or fainting spells. This medicine  can make you more sensitive to the sun. Keep out of the sun. If you cannot avoid being in the sun, wear protective clothing and use a sunscreen. Do not use sun lamps or tanning beds/booths. Contact your doctor if you get a sunburn. If you are a diabetic monitor your blood glucose carefully. If you get an unusual reading stop taking this medicine and call your doctor right away. Avoid antacids, calcium, iron, and zinc products for 2 hours before and 2 hours after taking a dose of this medicine. What side effects may I notice from receiving this medicine? Side effects that you should report to your doctor or health care professional as soon as possible: -allergic reactions like skin rash or hives, swelling of the face, lips, or tongue -anxious -breathing problems -confusion -depressed mood -diarrhea -dizziness -fast, irregular heartbeat -hallucination, loss of contact with reality -joint, muscle, or tendon pain or swelling -muscle weakness -pain, tingling, numbness in the hands or feet -seizures -signs and symptoms of high blood sugar such as dizziness; dry mouth; dry skin; fruity breath; nausea; stomach pain; increased hunger or thirst; increased urination -signs and symptoms of liver injury like dark yellow or brown urine; general ill feeling or flu-like symptoms; light-colored stools; loss of appetite; nausea; right upper belly pain; unusually weak or tired; yellowing of the eyes or skin -signs and symptoms of low blood sugar such as feeling anxious; confusion; dizziness; increased hunger; unusually weak or tired; sweating; shakiness; cold; irritable; headache; blurred vision; fast heartbeat; loss of consciousness -suicidal thoughts or other mood changes -sunburn -unusually weak or tired Side effects that usually do not require medical attention (report to your doctor or health care professional if they continue or are bothersome): -constipation -dry mouth -headache -nausea,  vomiting -trouble sleeping This list may not describe all possible side effects. Call your doctor for medical advice about side effects. You may report side effects to FDA at 1-800-FDA-1088. Where should I keep my medicine? Keep out of the reach of children. Store at room temperature between 15 and 30 degrees C (59 and 86 degrees F). Keep in a tightly closed container. Throw away any unused medicine after the expiration date. NOTE: This sheet is a summary. It may not cover all possible information. If you have questions about this medicine, talk to your doctor, pharmacist, or health care provider.  2018 Elsevier/Gold Standard (2016-01-15 12:38:27) Metronidazole extended-release tablets What is this medicine? METRONIDAZOLE (me troe NI da zole) is an antiinfective. It is used to treat certain kinds of bacterial and protozoal infections. It will not work for colds, flu, or other viral infections. This medicine may be used for other purposes; ask your health care provider or pharmacist if you have questions. COMMON BRAND NAME(S): Flagyl ER What should I tell my health care provider before I take this medicine? They need to know if you have any of these conditions: -anemia or other blood disorders -disease of the nervous system -fungal or yeast infection -if you drink alcohol containing drinks -liver disease -seizures -an unusual or allergic reaction to metronidazole, or other medicines, foods, dyes, or preservatives -pregnant or trying to get pregnant -breast-feeding How should I use this medicine? Take this medicine by mouth with a full glass of water. Follow the directions on the prescription label. Do not crush or  chew. Take this medicine on an empty stomach 1 hour before or 2 hours after meals or food. Take your medicine at regular intervals. Do not take your medicine more often than directed. Take all of your medicine as directed even if you think you are better. Do not skip doses or stop  your medicine early. Talk to your pediatrician regarding the use of this medicine in children. Special care may be needed. Overdosage: If you think you have taken too much of this medicine contact a poison control center or emergency room at once. NOTE: This medicine is only for you. Do not share this medicine with others. What if I miss a dose? If you miss a dose, take it as soon as you can. If it is almost time for your next dose, take only that dose. Do not take double or extra doses. What may interact with this medicine? Do not take this medicine with any of the following medications: -alcohol or any product that contains alcohol -amprenavir oral solution -cisapride -disulfiram -dofetilide -dronedarone -paclitaxel injection -pimozide -ritonavir oral solution -sertraline oral solution -sulfamethoxazole-trimethoprim injection -thioridazine -ziprasidone This medicine may also interact with the following medications: -birth control pills -cimetidine -lithium -other medicines that prolong the QT interval (cause an abnormal heart rhythm) -phenobarbital -phenytoin -warfarin This list may not describe all possible interactions. Give your health care provider a list of all the medicines, herbs, non-prescription drugs, or dietary supplements you use. Also tell them if you smoke, drink alcohol, or use illegal drugs. Some items may interact with your medicine. What should I watch for while using this medicine? Tell your doctor or health care professional if your symptoms do not improve or if they get worse. You may get drowsy or dizzy. Do not drive, use machinery, or do anything that needs mental alertness until you know how this medicine affects you. Do not stand or sit up quickly, especially if you are an older patient. This reduces the risk of dizzy or fainting spells. Avoid alcoholic drinks while you are taking this medicine and for three days afterward. Alcohol may make you feel dizzy,  sick, or flushed. If you are being treated for a sexually transmitted disease, avoid sexual contact until you have finished your treatment. Your sexual partner may also need treatment. What side effects may I notice from receiving this medicine? Side effects that you should report to your doctor or health care professional as soon as possible: -allergic reactions like skin rash, itching or hives, swelling of the face, lips, or tongue -confusion, clumsiness -difficulty speaking -discolored or sore mouth -dizziness -fever, infection -numbness, tingling, pain or weakness in the hands or feet -trouble passing urine or change in the amount of urine -redness, blistering, peeling or loosening of the skin, including inside the mouth -seizures -unusually weak or tired -vaginal irritation, dryness, or discharge Side effects that usually do not require medical attention (report to your doctor or health care professional if they continue or are bothersome): -diarrhea -headache -irritability -metallic taste -nausea -stomach pain or cramps -trouble sleeping This list may not describe all possible side effects. Call your doctor for medical advice about side effects. You may report side effects to FDA at 1-800-FDA-1088. Where should I keep my medicine? Keep out of the reach of children. Store at room temperature between 15 and 30 degrees C (59 and 86 degrees F). Protect from light and moisture. Keep container tightly closed. Throw away any unused medicine after the expiration date. NOTE: This sheet is  a summary. It may not cover all possible information. If you have questions about this medicine, talk to your doctor, pharmacist, or health care provider.  2018 Elsevier/Gold Standard (2013-02-11 14:05:10)

## 2017-04-27 ENCOUNTER — Ambulatory Visit (INDEPENDENT_AMBULATORY_CARE_PROVIDER_SITE_OTHER): Payer: BLUE CROSS/BLUE SHIELD | Admitting: Family Medicine

## 2017-04-27 ENCOUNTER — Encounter: Payer: Self-pay | Admitting: Family Medicine

## 2017-04-27 ENCOUNTER — Encounter: Payer: Self-pay | Admitting: Physician Assistant

## 2017-04-27 VITALS — BP 130/90 | HR 119 | Temp 97.9°F | Resp 18 | Ht 74.0 in | Wt 239.0 lb

## 2017-04-27 DIAGNOSIS — K529 Noninfective gastroenteritis and colitis, unspecified: Secondary | ICD-10-CM | POA: Diagnosis not present

## 2017-04-27 DIAGNOSIS — R198 Other specified symptoms and signs involving the digestive system and abdomen: Secondary | ICD-10-CM

## 2017-04-27 DIAGNOSIS — T781XXA Other adverse food reactions, not elsewhere classified, initial encounter: Secondary | ICD-10-CM

## 2017-04-28 NOTE — Progress Notes (Signed)
Subjective:    Patient ID: [name], male    DOB: [birth year], 36 y.o.   MRN: [id number]  HPI The patient has a very complicated PMH.  He was originally admitted on January 31 with pneumoperitoneum secondary to microperforation due to foreign body.  This was treated by surgery with percutaneous drain. CT showed resolution of pneumoperitoneum and he was discharged on February 7 and the last drain was removed on March 6. He was then readmitted on March 25 and was found to have an abscess in the right mid pelvis. He underwent diagnostic laparoscopy and drainage by surgery and was discharged on April 2. He had done well until 04/05/2002 admitted with sepsis due to colitis with small bowel obstruction. I have copied the CT results and included them below for my reference: 9/17 Moderate inflammatory changes within the abdomen and upper pelvis with poorly defined inflammatory process in the right lower quadrant/upper pelvis. This appears to be centered around non dilated loops of small bowel, suggesting small bowel inflammation, possibly due to inflammatory bowel disease. Dilated segment of small bowel within the upper pelvis could relate to localized ileus. Mild diffuse generalized inflammation within the peritoneal cavity/central mesentery. No free air at this time. No organized abscess. There are mild changes of the transverse colon which could be due to mild colitis. 2. Sigmoid colon diverticular disease. 3. Hepatic steatosis. Stable small hyperenhancing focus in the dome of the liver, possibly a flash hemangioma. 9/21 Multiple previous examinations have been reviewed and I am concerned the patient has experienced a recurrent episode of acute diverticulitis secondary to a small diverticulum arising from the anterior aspect of the sigmoid colon. This small diverticulum is associated with several small foci of pneumoperitoneum and appears to contain a serpiginous communication to a  developing suspected abscess within the root of the abdominal mesentery measuring approximately 11.6 cm in diameter. 2. Interval progression of circumferential bowel wall thickening involving several loops of mid and distal small bowel, not resulting in enteric obstruction - while potentially secondary to a concomitant inflammatory etiology (such as Crohn's colitis), I favor these loops are secondarily involved due to their proximity to the suspected developing abscess within the root of the abdominal Mesentery. Patient was treated with several weeks of antibiotics and was discharged from the hospital on September 27. Patient had a colonoscopy prior to discharge from the hospital that was reportedly negative and showed no evidence of Crohn's colitis he is still perplexed as to the reason this continues to happen. He is yet to follow-up with GI. He would like a referral to see an allergist. He believes that he has food allergies that may be triggering this. Particularly he becomes extremely nauseated and experiences reflux nausea and abdominal pain whenever he eats chicken.  He denies any bloody diarrhea.  Past Medical History:  Diagnosis Date  . Legionella pneumonia (HCC)    2009   Past Surgical History:  Procedure Laterality Date  . COLONOSCOPY WITH PROPOFOL N/A 04/15/2017   Procedure: COLONOSCOPY WITH PROPOFOL;  Surgeon: Benancio Deeds, MD;  Location: [facility];  Service: Gastroenterology;  Laterality: N/A;  . HERNIA REPAIR    . IR GENERIC HISTORICAL  09/09/2016   IR RADIOLOGIST EVAL & MGMT 09/09/2016 Berdine Dance, MD [facility] INTERV RAD  . IR RADIOLOGIST EVAL & MGMT  09/23/2016  . KNEE SURGERY Left   . LAPAROSCOPIC SMALL BOWEL RESECTION N/A 10/16/2016   Procedure: LAPAROSCOPIC SMALL BOWEL RESECTION POSSIBLE EXPLORATORY LAPAROTOMY AND POSSIBLE OPEN WASH OUT  ileocectomy;  Surgeon: Almond Lint, MD;  Location: [facility] ORS;  Service: General;  Laterality: N/A;   Current Outpatient  Prescriptions on File Prior to Visit  Medication Sig Dispense Refill  . famotidine (PEPCID) 20 MG tablet Take 1 tablet (20 mg total) by mouth 2 (two) times daily. 60 tablet 0  . loratadine (CLARITIN) 10 MG tablet Take 10 mg by mouth daily.    Marland Kitchen saccharomyces boulardii (FLORASTOR) 250 MG capsule Take 1 capsule (250 mg total) by mouth 2 (two) times daily. 60 capsule 0   No current facility-administered medications on file prior to visit.    Allergies  Allergen Reactions  . Keflex [Cephalexin] Anaphylaxis and Rash  . Penicillins Other (See Comments)    Childhood Has patient had a PCN reaction causing immediate rash, facial/tongue/throat swelling, SOB or lightheadedness with hypotension: uknown Has patient had a PCN reaction causing severe rash involving mucus membranes or skin necrosis: unknown Has patient had a PCN reaction that required hospitalization unknown Has patient had a PCN reaction occurring within the last 10 years: uknown If all of the above answers are "NO", then may proceed with Cephalosporin use.   . Sulfa Antibiotics Other (See Comments)    Unknown. Childhood Allergy   Social History   Social History  . Marital status: Married    Spouse name: N/A  . Number of children: N/A  . Years of education: N/A   Occupational History  . Not on file.   Social History Main Topics  . Smoking status: Never Smoker  . Smokeless tobacco: Never Used  . Alcohol use No  . Drug use: No  . Sexual activity: Yes     Comment: married, works at [employer]   Other Topics Concern  . Not on file   Social History Narrative  . No narrative on file    Review of Systems  All other systems reviewed and are negative.      Objective:   Physical Exam  Cardiovascular: Normal rate, regular rhythm and normal heart sounds.   Pulmonary/Chest: Effort normal and breath sounds normal. No respiratory distress. He has no wheezes. He has no rales.  Abdominal: Soft. Bowel sounds are normal. He  exhibits no distension and no mass. There is no tenderness. There is no rebound and no guarding.  Vitals reviewed.  Surgical scar in lower pelvis.       Assessment & Plan:  Colitis with complication  Food sensitivity with gastrointestinal symptoms - Plan: Ambulatory referral to Allergy  I will gladly consult an allergist although as I explained to the patient, I do not feel food allergies are the root of his problem.  I feel one of 2 processes are at play. Either the patient has Crohn's colitis and is having recurrent exacerbations that lead to infections/abscess or patient continues to have episodic leakage from a microperforation in a diverticuli that was the original event in March but continues to have frequent exacerbations requiring hospitalization. I have recommended that he follow-up with GI for capsule endoscopy to rule out inflammatory bowel disease.  If the patient continues to have recurrent diverticulitis with pelvic abscess, he may require partial bowel resection of the involved portion of the sigmoid colon with a microperforation occurred. I will defer to GI and surgery at the present time. I will consult an allergist per the patient's request

## 2017-05-07 ENCOUNTER — Ambulatory Visit (INDEPENDENT_AMBULATORY_CARE_PROVIDER_SITE_OTHER): Payer: BLUE CROSS/BLUE SHIELD | Admitting: Physician Assistant

## 2017-05-07 ENCOUNTER — Encounter: Payer: Self-pay | Admitting: Physician Assistant

## 2017-05-07 VITALS — BP 128/80 | HR 74 | Ht 74.0 in | Wt 239.0 lb

## 2017-05-07 DIAGNOSIS — Z87898 Personal history of other specified conditions: Secondary | ICD-10-CM

## 2017-05-07 DIAGNOSIS — Z8719 Personal history of other diseases of the digestive system: Secondary | ICD-10-CM

## 2017-05-07 DIAGNOSIS — Z9049 Acquired absence of other specified parts of digestive tract: Secondary | ICD-10-CM | POA: Diagnosis not present

## 2017-05-07 NOTE — Progress Notes (Signed)
Agree with assessment and plan as outlined. Patient with a complicated recent history. I previously discussed his case at length with IR / radiology and general surgery - question on whether or not he had abscess associated with diverticular disease from the left colon versus primary small bowel pathology. Based on these discussions it was thought that he more likely had a primary small bowel process and less likely solely diverticulitis driving this process. His prior colonoscopy showed no evidence of Crohns but I think he warrants further evaluation of the small bowel to ensure normal. A patency capsule is reasonable given he had obstructive symptoms previously. If this passes okay, will proceed with regular capsule to evaluate the small bowel. Will await results.

## 2017-05-07 NOTE — Progress Notes (Signed)
Subjective:    Patient ID: [name], male    DOB: 13-Jul-1981, 36 y.o.   MRN: [id number]  HPI [name] is a pleasant 36 year old white male, recently known to Dr. Adela Lank from hospitalization. He comes back in today for follow-up. Patient has history of previous left inguinal hernia repair in 2008 with mesh, and right inguinal hernia repair 2011. 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 He was treated with percutaneous drainage initially and IV antibiotics. All drains were removed by March 2018 at which time he fistula had apparently resolved. Unfortunately was was readmitted 10/12/2016 with recurrent intra-abdominal abscesses and bowel and mesenteric inflammation. He ultimately required a diagnostic laparotomy and drainage of abdominal abscess and ileocecectomy per Dr. Rowan Blase. Path from the small bowel specimen was benign with serosal adhesions and serositis and areas of foreign body giant cell reaction presumably from prior perforation, he had a benign appendix, no evidence for IBD, dysplasia or malignancy. Postoperatively he did develop loose stools which had persisted. He was readmitted in September 2018 with abdominal pain nausea and vomiting and was diagnosed with a small bowel obstruction. CT scan on 04/06/2017 showed moderate inflammatory changes within the abdomen and upper pelvis, poorly defined inflammatory process in the right lower quadrant/upper pelvis centered around nondilated loops of small bowel could not rule out IBD was a dilated segment of small bowel in the upper pelvis and mild diffuse generalized inflammation within the peritoneal cavity and central mesentery sigmoid colon diverticular disease. He was initially treated with antibiotics, and NG decompression. He had repeat CT scan on 04/10/2017 which raised question of possible sigmoid diverticulitis with suspected fluid  collection/abscess 11 cm in diameter along with thickening of the mid to distal small bowel. Images were reviewed with IR or potential drainage and it was not felt that the fluid represented an abscess., And did not feel it would be accessible via IR approach Patient improved quickly, tolerated a bowel prep and underwent colonoscopy on 04/15/2017 with Dr. Adela Lank. This was a normal exam with end-to-end ileocolonic anastomosis in the ascending colon multiple left colon diverticuli no evidence of diverticulitis and he had a normal terminal ileum. TI was unable to be deeply intubated due to angulation. Biopsies were taken and these returned showing normal mucosa. Patient states been doing very well since discharge from hospital. He feels that he's regaining his strength. He is eating without difficulty. He had been having some gas and bloating and loose stools all of which seems to have improved with an over-the-counter supplement containing digestive enzymes as well as probiotic. As no current complaints of abdominal pain melena or hematochezia. No fevers. He is back to work.  Review of Systems Pertinent positive and negative review of systems were noted in the above HPI section.  All other review of systems was otherwise negative.  Outpatient Encounter Prescriptions as of 05/07/2017  Medication Sig  . DIGESTIVE ENZYMES PO Take by mouth daily.  . famotidine (PEPCID) 20 MG tablet Take 1 tablet (20 mg total) by mouth 2 (two) times daily.  Marland Kitchen loratadine (CLARITIN) 10 MG tablet Take 10 mg by mouth daily.  . Probiotic Product (PROBIOTIC ADVANCED PO) Take by mouth daily.  . [DISCONTINUED] saccharomyces boulardii (FLORASTOR) 250 MG capsule Take 1 capsule (250 mg total) by mouth 2 (two) times daily.   No facility-administered encounter medications on file as of 05/07/2017.    Allergies  Allergen Reactions  . Keflex [  Cephalexin] Anaphylaxis and Rash  . Penicillins Other (See Comments)    Childhood Has  patient had a PCN reaction causing immediate rash, facial/tongue/throat swelling, SOB or lightheadedness with hypotension: uknown Has patient had a PCN reaction causing severe rash involving mucus membranes or skin necrosis: unknown Has patient had a PCN reaction that required hospitalization unknown Has patient had a PCN reaction occurring within the last 10 years: uknown If all of the above answers are "NO", then may proceed with Cephalosporin use.   . Sulfa Antibiotics Other (See Comments)    Unknown. Childhood Allergy   Patient Active Problem List   Diagnosis Date Noted  . Abnormal CT of the abdomen   . Abdominal pain   . Sepsis (HCC) 04/06/2017  . Colitis 04/06/2017  . Pelvic abscess in male [facility](HCC) 10/12/2016  . SBO (small bowel obstruction) (HCC) 08/21/2016  . Small bowel obstruction (HCC) 08/20/2016   Social History   Social History  . Marital status: Married    Spouse name: N/A  . Number of children: N/A  . Years of education: N/A   Occupational History  . Not on file.   Social History Main Topics  . Smoking status: Never Smoker  . Smokeless tobacco: Never Used  . Alcohol use No  . Drug use: No  . Sexual activity: Yes     Comment: married, works at [employer]   Other Topics Concern  . Not on file   Social History Narrative  . No narrative on file    [name]'s family history includes Cancer in his paternal grandfather; Diabetes in his father; Mental illness in his mother.      Objective:    Vitals:   05/07/17 1004  BP: 128/80  Pulse: 74    Physical Exam  well-developed white male in no acute distress, pleasant blood pressure 128/80 pulse 74, BMI 30.6. HEENT; nontraumatic normocephalic EOMI PERRLA sclera anicteric, Cardiovascular ;regular rate and rhythm with S1-S2 no murmur or gallop, Pulmonary; clear bilaterally Abdomen ;soft, bowel sounds are present he has a midline incisional scar no palpable mass or hepatosplenomegaly, some minimal tenderness with deep  palpation in the right lower quadrant, Rectal; exam not done, Extremities; no clubbing cyanosis or edema skin warm and dry, Neuropsych ;mood and affect appropriate       Assessment & Plan:   #191 36 year old white male status post ileocecectomy March 2018, along with drainage of intra-abdominal abscesses. There was some concern for underlying IBD however surgical path was benign with no evidence for IBD. Patient was readmitted September 2018 with partial small bowel obstruction and Sirs. Initial imaging was concerning for developing large abscess at the root of the mesentery possible acute diverticulitis as well as circumferential bowel wall thickening of several loops of mid and distal small bowel. After review with IR was felt that this fluid collection was not consistent with an abscess. Patient was treated and treated with IV antibiotics and eventually underwent colonoscopy during that admission which was a normal exam including intubation of the terminal ileum. Biopsies from the TI showed normal mucosa.  Exact etiology of patient's complex recent history is not clear, there is still concern concern for underlying IBD to have been unable to prove.  #2 history of bilateral patella alta  Plan;  Will proceed with further small bowel evaluation, initially with patency capsule, and if this proves small bowel patency will then schedule for capsule endoscopy. Agent will continue over-the-counter digestive enzyme and probiotic. He is eager to proceed with  any further diagnostic evaluation.    Amy S Esterwood PA-C 05/07/2017   Cc: Donita Brooks, MD

## 2017-05-07 NOTE — Patient Instructions (Signed)
A nurse from our office will call you next week when we get the Patency capsule in. We will go over what you need to do ahead of time for the preperation .

## 2017-05-12 ENCOUNTER — Telehealth: Payer: Self-pay | Admitting: Physician Assistant

## 2017-05-12 ENCOUNTER — Telehealth: Payer: Self-pay

## 2017-05-12 DIAGNOSIS — Z8719 Personal history of other diseases of the digestive system: Secondary | ICD-10-CM

## 2017-05-12 NOTE — Telephone Encounter (Signed)
Did you call this patient?

## 2017-05-12 NOTE — Telephone Encounter (Signed)
Patient will come pick up Patency capsule tomorrow am.  He verbalized understanding he will need a KUB at 30 hours, 2 pm on 05/14/17, and 2 pm on 05/15/17 unless he sees the capsule pass in his stool.  He understands to have liquids only the rest of the day and can have a normal meal tomorrow at 12:00 pm after taking the capsule at 8:00 am.

## 2017-05-12 NOTE — Telephone Encounter (Signed)
-----   Message from Sammuel CooperAmy S Esterwood, PA-C sent at 05/07/2017  3:41 PM EDT ----- Regarding: Patency capsule Lavonna RuaSheri - please contact pt as soon as Patency capsules arrive  To come in for study next week - I printed instructions - will need KUB  30  -36 hours later - and if still present then repeat KUB again in 24 hours ... Can put order under me - he is [facility] pt

## 2017-05-12 NOTE — Telephone Encounter (Signed)
See additional phone note from today for details.  

## 2017-05-14 ENCOUNTER — Ambulatory Visit (INDEPENDENT_AMBULATORY_CARE_PROVIDER_SITE_OTHER)
Admission: RE | Admit: 2017-05-14 | Discharge: 2017-05-14 | Disposition: A | Discharge status: Home / Self Care | Payer: BLUE CROSS/BLUE SHIELD | Source: Ambulatory Visit | Attending: Physician Assistant | Admitting: Physician Assistant

## 2017-05-14 DIAGNOSIS — Z8719 Personal history of other diseases of the digestive system: Secondary | ICD-10-CM | POA: Diagnosis not present

## 2017-05-15 ENCOUNTER — Other Ambulatory Visit: Payer: Self-pay

## 2017-05-15 DIAGNOSIS — K529 Noninfective gastroenteritis and colitis, unspecified: Secondary | ICD-10-CM

## 2017-05-25 ENCOUNTER — Telehealth: Payer: Self-pay

## 2017-05-25 NOTE — Telephone Encounter (Signed)
I have called patient and left message for patient that insurance has denied endocapsule. I have cancelled this appointment. Awaiting paperwork re: denial.

## 2017-05-25 NOTE — Telephone Encounter (Signed)
Ok thanks for letting me know. If you can get the paperwork about it and send it to me I would like to discuss with his insurance company to find out how to appeal this decision, he really needs this study to evaluate his small bowel. Thanks

## 2017-05-25 NOTE — Telephone Encounter (Signed)
Received faxed paperwork for denial, spoke to patient and will let him know what is next plan. Paperwork placed on Dr. Lanetta InchArmbruster's desk.

## 2017-05-25 NOTE — Telephone Encounter (Signed)
-----   Message from Libby MawAmy L Hazelwood sent at 05/25/2017  6:20 AM EST ----- Regarding: Capsule Roxana HiresHey Julie,  This capsule has been denied. I sent it to April to print out fax I received. She will get it to you today.  Pt is scheduled for 11/12 so we have a few days. There are instructions on what can be done at this point.  Dr. Adela LankArmbruster can call the number that is provided.  All records have been sent, but sometimes if the MD speaks to their medical director, it can be approved. Thanks, Amy

## 2017-06-01 NOTE — Telephone Encounter (Signed)
Left message for patient to call back, insurance approved capsule study to be scheduled.

## 2017-06-01 NOTE — Telephone Encounter (Signed)
Patient rescheduled for 06/08/17 for endocapsule. Resent prep instructions as patient lost the other set.

## 2017-06-04 ENCOUNTER — Encounter: Payer: Self-pay | Admitting: Allergy & Immunology

## 2017-06-04 ENCOUNTER — Ambulatory Visit (INDEPENDENT_AMBULATORY_CARE_PROVIDER_SITE_OTHER): Payer: BLUE CROSS/BLUE SHIELD | Admitting: Allergy & Immunology

## 2017-06-04 VITALS — BP 140/86 | HR 96 | Temp 98.3°F | Resp 20 | Ht 73.5 in | Wt 238.0 lb

## 2017-06-04 DIAGNOSIS — T781XXD Other adverse food reactions, not elsewhere classified, subsequent encounter: Secondary | ICD-10-CM

## 2017-06-04 DIAGNOSIS — J302 Other seasonal allergic rhinitis: Secondary | ICD-10-CM | POA: Diagnosis not present

## 2017-06-04 DIAGNOSIS — J3089 Other allergic rhinitis: Secondary | ICD-10-CM

## 2017-06-04 DIAGNOSIS — Z8719 Personal history of other diseases of the digestive system: Secondary | ICD-10-CM

## 2017-06-04 DIAGNOSIS — T781XXA Other adverse food reactions, not elsewhere classified, initial encounter: Secondary | ICD-10-CM | POA: Insufficient documentation

## 2017-06-04 NOTE — Progress Notes (Deleted)
NEW PATIENT  Date of Service/Encounter:  06/04/17  Referring provider: Susy Frizzle, MD   Assessment:   No diagnosis found.   Asthma Reportables:  Severity: {Blank single:19197::"intermittent","moderate persistent","severe persistent","mild persistent"}  Risk: {DESC; LOW/MEDIUM/HIGH:23084} Control: {Asthma Control (Reporting):20434}  Seasonal Influenza Vaccine: {Blank single:19197::"no but encouraged","refused","yes"}   PPSV-23 Vaccine (CDC recommended for patients with persistent asthma 19-64yo): {Responses; yes/no/refused:32142}    Plan/Recommendations:    There are no Patient Instructions on file for this visit.    Subjective:   [name] is a 36 y.o. male presenting today for evaluation of  Chief Complaint  Patient presents with  . Food Intolerance    [name] has a history of the following: Patient Active Problem List   Diagnosis Date Noted  . Abnormal CT of the abdomen   . Abdominal pain   . Sepsis ([place]) 04/06/2017  . Colitis 04/06/2017  . Pelvic abscess in male [facility]) 10/12/2016  . SBO (small bowel obstruction) ([place]) 08/21/2016  . Small bowel obstruction ([place]) 08/20/2016    History obtained from: chart review and ***.  [name] was referred by Susy Frizzle, MD.     [name] is a 36 y.o. male presenting for ***.      Asthma/Respiratory Symptom History:   Allergic Rhinitis Symptom History:    Food Allergy Symptom History:   ***Otherwise, there is no history of other atopic diseases, including asthma, drug allergies, food allergies, environmental allergies, stinging insect allergies, or urticaria. There is no significant infectious history. ***Vaccinations are up to date.    Past Medical History: Patient Active Problem List   Diagnosis Date Noted  . Abnormal CT of the abdomen   . Abdominal pain   . Sepsis ([place]) 04/06/2017  . Colitis 04/06/2017  . Pelvic abscess in male [facility]) 10/12/2016  . SBO (small bowel  obstruction) ([place]) 08/21/2016  . Small bowel obstruction ([place]) 08/20/2016    Medication List:  Allergies as of 06/04/2017      Reactions   Keflex [cephalexin] Anaphylaxis, Rash   Penicillins Other (See Comments)   Childhood Has patient had a PCN reaction causing immediate rash, facial/tongue/throat swelling, SOB or lightheadedness with hypotension: uknown Has patient had a PCN reaction causing severe rash involving mucus membranes or skin necrosis: unknown Has patient had a PCN reaction that required hospitalization unknown Has patient had a PCN reaction occurring within the last 10 years: uknown If all of the above answers are "NO", then may proceed with Cephalosporin use.   Sulfa Antibiotics Other (See Comments)   Unknown. Childhood Allergy      Medication List        Accurate as of 06/04/17  2:08 PM. Always use your most recent med list.          DIGESTIVE ENZYMES PO Take by mouth daily.   famotidine 20 MG tablet Commonly known as:  PEPCID Take 1 tablet (20 mg total) by mouth 2 (two) times daily.   loratadine 10 MG tablet Commonly known as:  CLARITIN Take 10 mg by mouth daily.   PROBIOTIC ADVANCED PO Take by mouth daily.       Birth History: ***non-contributory. Born at term without complications.   Developmental History: [name] has met all milestones on time. He has required no speech therapy, occupational therapy, or physical therapy. ***non-contributory.   Past Surgical History: Past Surgical History:  Procedure Laterality Date  . COLONOSCOPY WITH PROPOFOL N/A 04/15/2017   Procedure: COLONOSCOPY WITH PROPOFOL;  Surgeon: Yetta Flock, MD;  Location: [facility] ENDOSCOPY;  Service: Gastroenterology;  Laterality: N/A;  . HERNIA REPAIR    . IR GENERIC HISTORICAL  09/09/2016   IR RADIOLOGIST EVAL & MGMT 09/09/2016 Greggory Keen, MD [facility] INTERV RAD  . IR RADIOLOGIST EVAL & MGMT  09/23/2016  . KNEE SURGERY Left   . LAPAROSCOPIC SMALL BOWEL RESECTION N/A  10/16/2016   Procedure: LAPAROSCOPIC SMALL BOWEL RESECTION POSSIBLE EXPLORATORY LAPAROTOMY AND POSSIBLE OPEN [place] OUT ileocectomy;  Surgeon: Stark Klein, MD;  Location: [facility] ORS;  Service: General;  Laterality: N/A;     Family History: Family History  Problem Relation Age of Onset  . Diabetes Father   . Cancer Paternal Grandfather        oral cancer  . Mental illness Mother      Social History: [name] lives at home with ***.     Review of Systems: a 14-point review of systems is pertinent for what is mentioned in HPI.  Otherwise, all other systems were negative. Constitutional: negative other than that listed in the HPI Eyes: negative other than that listed in the HPI Ears, nose, mouth, throat, and face: negative other than that listed in the HPI Respiratory: negative other than that listed in the HPI Cardiovascular: negative other than that listed in the HPI Gastrointestinal: negative other than that listed in the HPI Genitourinary: negative other than that listed in the HPI Integument: negative other than that listed in the HPI Hematologic: negative other than that listed in the HPI Musculoskeletal: negative other than that listed in the HPI Neurological: negative other than that listed in the HPI Allergy/Immunologic: negative other than that listed in the HPI    Objective:   There were no vitals taken for this visit. There is no height or weight on file to calculate BMI.   Physical Exam:  General: Alert, interactive, in no acute distress. Eyes: {Blank multiple:19196::"No conjunctival injection present on the right","No conjunctival injection present on the left","No conjunctival injection bilaterally","Conjunctival injection on the right with limbal sparing","Conjunctival injection on the left with limbal sparing","Conjunctival injection bilaterally with limbal sparing","no discharge on the right","no discharge on the left","no Horner-Trantas dots present","allergic  shiners present bilaterally","***"}. PERRL bilaterally. EOMI without pain. No photophobia.  Ears: {Blank multiple:19196::"Right TM pearly gray with normal light reflex","Left TM pearly gray with normal light reflex","Right TM erythematous but not bulging","Left TM erythematous but not bulging","Right TM erythematous and bulging","Left TM erythematous and bulging","Right OME","Left OME","Right TM intact without perforation","Left TM intact without perforation","Right TM unable to be visualized due to cerumen impaction","Left TM unable to be visualized due to cerumen impaction","Patent tympanostomy tube present on the right","Patent tympanostomy tube present on the left","***"}.  Nose/Throat: {Blank multiple:19196::"External nose within normal limits","nasal crease present","septum midline"}. Turbinates {Blank single:19197::"non-edematous","edematous","edematous and pale","markedly edematous","markedly edematous and pale","moderately edematous","mildly edematous","minimally edematous"} {Blank single:19197::"with crusty discharge","with thick discharge","with clear discharge","without discharge"}. Posterior oropharynx {Blank single:19197::"unremarkable","non erythematous","erythematous","markedly erythematous","moderately erythematous","mildly erythematous"} {Blank single:19197::"with cobblestoning in the posterior oropharynx","without cobblestoning in the posterior oropharynx"}. Tonsils {Blank single:19197::"surgically absent","unremarklable","3+","4+","touching","2+"} {Blank single:19197::"with exudates","without exudates"}.  {Blank multiple:19196::"Tongue without thrush","Geographic tongue present"}. Neck: Supple without thyromegaly. Trachea midline. Adenopathy: {Blank multiple:19196::"shoddy bilateral anterior cervical lymphadenopathy","no enlarged lymph nodes appreciated in the occipital, axillary, epitrochlear, inguinal, or popliteal regions.","no enlarged lymph nodes appreciated in the anterior cervical,  occipital, axillary, epitrochlear, inguinal, or popliteal regions."} Lungs: {Blank single:19197::"Decreased breath sounds with expiratory wheezing bilaterally","Mildly decreased breath sounds with expiratory wheezing bilaterally","Decreased breath sounds bilaterally without wheezing, rhonchi or rales","Mildly decreased breath sounds bilaterally without wheezing, rhonchi or rales","Clear to auscultation without wheezing,  rhonchi or rales"}. {Blank single:19197::"Increased work of breathing","No increased work of breathing"}. CV: {Blank single:19197::"Physiologic splitting of S1/S2","Normal S1/S2"}. No murmurs. Capillary refill <2 seconds.  Abdomen: Nondistended, nontender. No guarding or rebound tenderness. Bowel sounds {Blank multiple:19196::"absent","faint","present in all fields","hypoactive","hyperactive"}  Skin: {Blank single:19197::"Dry, erythematous, excoriated patches on the ***","Dry, hyperpigmented, thickened patches on the ***","Dry, mildly hyperpigmented, mildly thickened patches on the ***","Scattered erythematous urticarial type lesions primarily located *** , nonvesicular","Warm and dry, without lesions or rashes"}. Extremities:  No clubbing, cyanosis or edema. Neuro:   Grossly intact. No focal deficits appreciated. Responsive to questions.  Diagnostic studies: none***/deferred due to recent antihistamine use***  Spirometry: {Blank single:19197::"results normal (FEV1: ***%, FVC: ***%, FEV1/FVC: ***%)","results abnormal (FEV1: ***%, FVC: ***%, FEV1/FVC: ***%)"}.    {Blank single:19197::"Spirometry consistent with mild obstructive disease","Spirometry consistent with moderate obstructive disease","Spirometry consistent with severe obstructive disease","Spirometry consistent with possible restrictive disease","Spirometry consistent with mixed obstructive and restrictive disease","Spirometry uninterpretable due to technique","Spirometry consistent with normal pattern"}. {Blank  single:19197::"Albuterol/Atrovent nebulizer","Xopenex/Atrovent nebulizer","Albuterol nebulizer"} treatment given in clinic with {Blank single:19197::"significant improvement in FEV1 per ATS criteria","significant improvement in FVC per ATS criteria","significant improvement in FEV1 and FVC per ATS criteria","improvement in FEV1, but not significant per ATS criteria","improvement in FVC, but not significant per ATS criteria","improvement in FEV1 and FVC, but not significant per ATS criteria","no improvement"}.  Allergy Studies: none***  ***Indoor/Outdoor Percutaneous Adult Environmental Panel: positive to {Blank multiple:19196::"bahia grass","Bermuda grass","johnson grass","[place] blue grass","meadow fescue grass","perennial rye grass","sweet vernal grass","timothy grass","cocklebur","burweed marsh elder","short ragweed","giant ragweed","English plantain","lamb's quarters","sheep sorrel","rough pigweed","rough marsh elder","common [employer] beech","Box elder","red cedar","eastern cottonwood","elm","hickory","maple","oak","pecan pollen","pine","Eastern sycamore","black walnut pollen","Alternaria","Cladosporium","Aspergillus","Penicillium","Bipolaris","Drechslera","Mucor","Fusarium","Aureobasidium","Rhizopus","Botrytis","epicoccum","Phoma","Candida","Tricophyton","Df mite","Dp mites","cat","dog","mixed feather","horse","cockroach","mouse","tobacco"}. Otherwise negative with adequate controls.  ***Indoor/Outdoor Percutaneous Pediatric Environmental Panel: positive to {Blank multiple:19196::"Berm[place] grass","[place] blue grass","perennial rye grass","timothy grass","short ragweed","giant ragweed","Birch","Hickory","Oak","Alternaria","Cladosporium","Aspergillus","Penicillium","Bipolaris","Drechslera","Mucor","Fusarium","Aureobasidium","Rhizopus","Epicoccum","Phoma","D farinae dust mite","Cat","Dog","D pteronyssinus dust mite","mixed feather","cockroach","Candida"}. Otherwise negative with  adequate controls.  ***Indoor/Outdoor Selected Intradermal Environmental Panel: positive to {Blank multiple:19196::"Bermuda grass","Johnson grass","Grass mix","ragweed mix","weed mix","tree mix","mold mix #1","mold mix #2","mold mix #3","mold mix #4","cat","dog","cockroach","mite mix"}. Otherwise negative with adequate controls.  ***Most Common Foods Panel (peanut, cashew, soy, fish mix, shellfish mix, wheat, milk, egg):  ***Full Food Panel: positive to {Blank multiple:19196::"Peanut (*** x ***)","Soy (*** x ***)","Wheat (*** x ***)","Corn (*** x ***)","Milk (*** x ***)","Egg (*** x ***)","Casein (*** x ***)","Shellfish Mix (*** x ***)","Fish Mix (*** x ***)","Cashew (*** x ***)","Tomato (*** x ***)","Orange (*** x ***)","Rice (*** x ***)","Pork (*** x ***)","[place] (*** x ***)","Beef (*** x ***)","Lamb (*** x ***)","Chicken (*** x ***)","White Potato (*** x ***)","Banana (*** x ***)","Apple (*** x ***)","Peach (*** x ***)","Sweet Potato (*** x ***)","Green Pea (*** x ***)","Strawberry (*** x ***)","Onion (*** x ***)","Cabbage (*** x ***)","Carrots (*** x ***)","Celery (*** x ***)","Karaya Gum (*** x ***)","Acacia (Arabic Gum) (*** x ***)","Cottonseed (*** x ***)","Flounder (*** x ***)","Trout (*** x ***)","Shrimp (*** x ***)","Crab (*** x ***)","Tuna (*** x ***)","Cantaloupe (*** x ***)","Watermelon (*** x ***)","Pecan (*** x ***)","Walnut (*** x ***)","Chocolate (*** x ***)","Grape (*** x ***)","Cucumber (*** x ***)","Saccharomycese Cerevisiae (*** x ***)","Oyster (*** x ***)","Lobster (*** x ***)","Scallop (*** x ***)","Salmon (*** x ***)","Almond (*** x ***)","Hazelnut (*** x ***)","[place] nut (*** x ***)","Coconut (*** x ***)","Cinnamon (*** x ***)","Nutmeg (*** x ***)","Ginger (*** x ***)","Garlic (*** x ***)","Black Pepper (*** x ***)","Mustard (*** x ***)","Barley (*** x ***)","Oat (*** x ***)","Rye (*** x ***)","Sesame (*** x ***)","Pineapple (*** x ***)"} with adequate controls. Negative to {Blank  multiple:19196::"Peanut","Soy","Wheat","Corn","Milk","Egg","Casein","Shellfish Mix","Fish Mix","Cashew","Tomato","Orange","Rice","Pork","Turkey","Beef","Lamb","Chicken","White Potato","Banana","Apple","Peach","Sweet Potato","Green Pea","Strawberry","Onion","Cabbage","Carrots","Celery","Karaya Gum","Acacia (Arabic Gum)","Cottonseed","Flounder","Trout","Shrimp","Crab","Tuna","Cantaloupe","Watermelon","Pecan","Walnut","Chocolate","Grape","Cucumber","Saccharomycese Cerevisiae","Oyster","Lobster","Scallop","Salmon","Almond","Hazelnut","Brazil nut","Coconut","Cinnamon","Nutmeg","Ginger","Garlic","Black Pepper","Mustard","Barley","Oat","Rye","Sesame","Pineapple"}   ***Selected Foods Panel:     Salvatore Marvel, MD [facility]

## 2017-06-04 NOTE — Progress Notes (Addendum)
NEW PATIENT  Date of Service/Encounter:  06/05/17  Referring provider: Donita BrooksPickard, Ruben T, MD   Assessment:   Seasonal and perennial allergic rhinitis  Adverse food reaction  History of bowel obstruction  Plan/Recommendations:    1. Seasonal and perennial allergic rhinitis - Begin Dymista nasal spray 1 spray per nostril twice a day as needed for nasal symptoms - Continue OTC antihistamine as needed for runny nose - Begin Singulair 10 mg daily to help control nasal symptoms The risks and benefits of aeroallergen immunotherapy have been discussed. The patient is motivated to initiate immunotherapy to reduce symptoms and decrease medication requirement. Informed consent has been signed.    2. Adverse food reaction - Your skin prink testing showed a positive reaction to casein. As you have been eating this with out having any symptoms, this is not likely a true food allergy.  Avoid this food if it seems to bother you. You may continue to eat these foods if you are not adversely affected.  Follow up in 4 months or sooner as needed   Subjective:   [name] is a 36 y.o. male presenting today for evaluation of  Chief Complaint  Patient presents with  . Food Intolerance    GI when he eats eggs yolk or chicken     [name] has a history of the following: Patient Active Problem List   Diagnosis Date Noted  . Seasonal and perennial allergic rhinitis 06/04/2017  . Adverse food reaction 06/04/2017  . History of small bowel obstruction 06/04/2017  . Abnormal CT of the abdomen   . Abdominal pain   . Sepsis ([facility]) 04/06/2017  . Colitis 04/06/2017  . Pelvic abscess in male [facility]) 10/12/2016  . SBO (small bowel obstruction) ([facility]) 08/21/2016  . Small bowel obstruction ([facility]) 08/20/2016    History obtained from: chart review and patient interview.  [name] was referred by Donita BrooksPickard, Ruben T, MD.     [name] is a 36 y.o. male presenting for evaluation of egg  and environmental allergies. He reports that, as a child, he had a sensitivity to scrambled eggs causing vomiting and abdominal pain 30-60 minutes after ingestion. As an adult he reports cramps, bloating, gas, flushing, and feeling light headed after eating mayonnaise and deviled eggs. He deniesconcominent cardiopulmonary  symptoms. [name] can eat foods with baked eggs without adverse symptoms. He reports going to the hospital for a bowel blockage and abdominal abscesses after eating a sausage patty with bone chips in January of this year. He was admitted in March and September for bowel related incidences. He is interested in avoiding any further bowel irritation and wants to discover if he is allergic to eggs at today's appointment.   He reports having a runny nose year round with flares in the fall and spring. He tries to reduce allergen exposure by keeping the leaves raked away from his home. [name] takes Allegra in the fall, uses Nasocort daily, and has started using essential oils. He reports a history of sinus infections occurring every 6-9 months that required an antibiotic for resolution, however, he has not had a sinus infection since beginning essential oils 3 years ago.  [name] denies any history of asthma or eczema. Family history is negative for food allergies, asthma, and eczema. His brother has seasonal allergies.    Past Medical History: Patient Active Problem List   Diagnosis Date Noted  . Seasonal and perennial allergic rhinitis 06/04/2017  . Adverse food reaction 06/04/2017  . History of  small bowel obstruction 06/04/2017  . Abnormal CT of the abdomen   . Abdominal pain   . Sepsis ([facility]) 04/06/2017  . Colitis 04/06/2017  . Pelvic abscess in male [facility]) 10/12/2016  . SBO (small bowel obstruction) ([facility]) 08/21/2016  . Small bowel obstruction ([facility]) 08/20/2016    Medication List:  Allergies as of 06/04/2017      Reactions   Keflex [cephalexin] Anaphylaxis, Rash   Penicillins  Other (See Comments)   Childhood Has patient had a PCN reaction causing immediate rash, facial/tongue/throat swelling, SOB or lightheadedness with hypotension: uknown Has patient had a PCN reaction causing severe rash involving mucus membranes or skin necrosis: unknown Has patient had a PCN reaction that required hospitalization unknown Has patient had a PCN reaction occurring within the last 10 years: uknown If all of the above answers are "NO", then may proceed with Cephalosporin use.   Sulfa Antibiotics Other (See Comments)   Unknown. Childhood Allergy      Medication List        Accurate as of 06/04/17 11:59 PM. Always use your most recent med list.          DIGESTIVE ENZYMES PO Take by mouth daily.   loratadine 10 MG tablet Commonly known as:  CLARITIN Take 10 mg by mouth daily.   PROBIOTIC ADVANCED PO Take by mouth daily.       Past Surgical History: Past Surgical History:  Procedure Laterality Date  . COLONOSCOPY WITH PROPOFOL N/A 04/15/2017   Performed by Benancio Deeds, MD at [facility] ENDOSCOPY  . HERNIA REPAIR    . IR GENERIC HISTORICAL  09/09/2016   IR RADIOLOGIST EVAL & MGMT 09/09/2016 Berdine Dance, MD [facility] INTERV RAD  . IR RADIOLOGIST EVAL & MGMT  09/23/2016  . KNEE SURGERY Left   . LAPAROSCOPIC SMALL BOWEL RESECTION POSSIBLE EXPLORATORY LAPAROTOMY AND POSSIBLE OPEN [place] OUT ileocectomy N/A 10/16/2016   Performed by Almond Lint, MD at [facility] ORS     Family History: Family History  Problem Relation Age of Onset  . Diabetes Father   . Cancer Paternal Grandfather        oral cancer  . Mental illness Mother   . Allergic rhinitis Mother   . Angioedema Neg Hx   . Asthma Neg Hx   . Eczema Neg Hx      Social History: [name] lives in a 36 year old house with his wife. There are no concerns for water or mildew damage. The flooring is vinyl and plank over a cement slab. Heating is gas and cooling is central. Fish and a dog are located in the home and the  dog does not sleep in the bedroom. Dust mite covers are in use. There is a concern for chemicals, fumes and dust exposure at the job site. 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    Review of Systems: a 14-point review of systems is pertinent for what is mentioned in HPI.  Otherwise, all other systems were negative. Constitutional: negative other than that listed in the HPI Eyes: negative other than that listed in the HPI Ears, nose, mouth, throat, and face: negative other than that listed in the HPI Respiratory: negative other than that listed in the HPI Cardiovascular: negative other than that listed in the HPI Gastrointestinal: negative other than that listed in the HPI Genitourinary: negative other than that listed in the HPI Integument: negative other than that listed in the HPI Hematologic: negative other than that listed in the HPI Musculoskeletal: negative other  than that listed in the HPI Neurological: negative other than that listed in the HPI Allergy/Immunologic: negative other than that listed in the HPI    Objective:   Blood pressure 140/86, pulse 96, temperature 98.3 F (36.8 C), temperature source Oral, resp. rate 20, height 6' 1.5" (1.867 m), weight 238 lb (108 kg). Body mass index is 30.97 kg/m.   Physical Exam:  General: Alert, interactive, in no acute distress. Eyes: No conjunctival injection present on the right and No conjunctival injection present on the left. PERRL bilaterally. EOMI without pain. No photophobia.  Ears: Right TM pearly gray with normal light reflex and Left TM pearly gray with normal light reflex.  Nose/Throat: External nose within normal limits and septum midline. Turbinates minimally edematous without discharge. Posterior oropharynx mildly erythematous without cobblestoning in the posterior oropharynx. Tonsils unremarklable without exudates.  Tongue without thrush. Neck: Supple without thyromegaly. Trachea midline. Adenopathy: no enlarged lymph  nodes appreciated in the occipital, axillary, epitrochlear, inguinal, or popliteal regions. Lungs: Clear to auscultation without wheezing, rhonchi or rales. No increased work of breathing. CV: Normal S1/S2. No murmurs. Capillary refill <2 seconds.  Abdomen: Nondistended, nontender. No guarding or rebound tenderness. Bowel sounds present in all fields  Skin: Warm and dry, without lesions or rashes. Extremities:  No clubbing, cyanosis or edema. Neuro:   Grossly intact. No focal deficits appreciated. Responsive to questions.  Allergy Studies:  Indoor/Outdoor Percutaneous Adult Environmental Panel: positive to bahia grass, [place] blue grass, meadow fescue grass, perennial rye grass, sweet vernal grass and timothy grass. Otherwise negative with adequate controls.   Indoor/Outdoor Selected Intradermal Environmental Panel: positive to mold mix #3, mold mix #4, cockroach and mite mix. Otherwise negative with adequate controls.  Most common foods positive to casein and negative to: peanut, soybean, wheat, corn, milk, chicken egg white, shellfish mix, fish mix, cashew, and hazelnut.    I performed a history and physical examination of the patient and discussed his management with the Nurse Practitioner. I reviewed the Nurse Practitioner's note and agree with the documented findings and plan of care. The note in its entirety was edited by myself, including the physical exam, assessment, and plan.    Malachi BondsJoel Gallagher, MD [facility]

## 2017-06-04 NOTE — Patient Instructions (Addendum)
1. Seasonal and perennial allergic rhinitis - Begin Dymista nasal spray 1 spray per nostril twice a day as needed for nasal symptoms - Continue OTC antihistamine as needed for runny nose - Begin Singulair 10 mg daily to help control nasal symptoms The risks and benefits of aeroallergen immunotherapy have been discussed. The patient is motivated to initiate immunotherapy to reduce symptoms and decrease medication requirement. Informed consent has been signed.   Reducing Pollen Exposure  The American Academy of Allergy, Asthma and Immunology suggests the following steps to reduce your exposure to pollen during allergy seasons.    1. Do not hang sheets or clothing out to dry; pollen may collect on these items. 2. Do not mow lawns or spend time around freshly cut grass; mowing stirs up pollen. 3. Keep windows closed at night.  Keep car windows closed while driving. 4. Minimize morning activities outdoors, a time when pollen counts are usually at their highest. 5. Stay indoors as much as possible when pollen counts or humidity is high and on windy days when pollen tends to remain in the air longer. 6. Use air conditioning when possible.  Many air conditioners have filters that trap the pollen spores. 7. Use a HEPA room air filter to remove pollen form the indoor air you breathe.   Control of Cockroach Allergen  Cockroach allergen has been identified as an important cause of acute attacks of asthma, especially in urban settings.  There are fifty-five species of cockroach that exist in the [place], however only three, the [place], [place] and Oriental species produce allergen that can affect patients with Asthma.  Allergens can be obtained from fecal particles, egg casings and secretions from cockroaches.    1. Remove food sources. 2. Reduce access to water. 3. Seal access and entry points. 4. Spray runways with 0.5-1% Diazinon or Chlorpyrifos 5. Blow boric acid power under stoves and  refrigerator. 6. Place bait stations (hydramethylnon) at feeding sites.   - Control of Mold Allergen  Mold and fungi can grow on a variety of surfaces provided certain temperature and moisture conditions exist.  Outdoor molds grow on plants, decaying vegetation and soil.  The major outdoor mold, Alternaria and Cladosporium, are found in very high numbers during hot and dry conditions.  Generally, a late Summer - Fall peak is seen for common outdoor fungal spores.  Rain will temporarily lower outdoor mold spore count, but counts rise rapidly when the rainy period ends.  The most important indoor molds are Aspergillus and Penicillium.  Dark, humid and poorly ventilated basements are ideal sites for mold growth.  The next most common sites of mold growth are the bathroom and the kitchen.  Outdoor MicrosoftMold Control 1. Use air conditioning and keep windows closed 2. Avoid exposure to decaying vegetation. 3. Avoid leaf raking. 4. Avoid grain handling. 5. Consider wearing a face mask if working in moldy areas.  Indoor Mold Control 1. Maintain humidity below 50%. 2. Clean washable surfaces with 5% bleach solution. 3. Remove sources e.g. Contaminated carpets.    2. Adverse food reaction, sequela - Your skin prink testing showed a positive reaction to casein. Avoid this food if it seems to bother you. You may continue to eat these foods if you are not adversely affected.   Follow up in 4 months or sooner as needed

## 2017-06-05 ENCOUNTER — Telehealth: Payer: Self-pay | Admitting: Gastroenterology

## 2017-06-05 NOTE — Telephone Encounter (Signed)
Spoke to patient, his instructions have been mailed twice. He will come by the office by 5:00 today to pick up 3rd set of instructions, no fax machine, no one else is able to pick this up.

## 2017-06-08 ENCOUNTER — Ambulatory Visit (INDEPENDENT_AMBULATORY_CARE_PROVIDER_SITE_OTHER): Payer: BLUE CROSS/BLUE SHIELD | Admitting: Gastroenterology

## 2017-06-08 DIAGNOSIS — R933 Abnormal findings on diagnostic imaging of other parts of digestive tract: Secondary | ICD-10-CM

## 2017-06-08 NOTE — Progress Notes (Signed)
Pt here for capsule endo, pt tolerated prep well. Capsule RU#[id number] Exp:  09/23/18

## 2017-06-10 ENCOUNTER — Telehealth: Payer: Self-pay | Admitting: Gastroenterology

## 2017-06-10 NOTE — Telephone Encounter (Signed)
Ruben FanningJulie can you let him know the capsule reached the colon. I spoke with [name] who has looked at it briefly but formal report pending. Given he just ingested it Monday, still may likely be in his colon if he hasn't seen it. I would give it more time, no real risk of obstruction as it has entered the colon. We will let him know the results early next week in light of the holiday weekend. thanks

## 2017-06-10 NOTE — Telephone Encounter (Signed)
Please advise, patient had capsule on Monday, 11/19.

## 2017-06-10 NOTE — Telephone Encounter (Signed)
Spoke to patient relayed information that capsule had reached his colon and will let him know next week about results.

## 2017-06-18 ENCOUNTER — Telehealth: Payer: Self-pay | Admitting: Gastroenterology

## 2017-06-18 NOTE — Telephone Encounter (Signed)
Capsule study performed and reviewed: Esophagitis noted in the distal esophagus Normal stomach Normal small bowel transit Essentially normal capsule endoscopy - no evidence of any inflammatory changes or Crohns disease.   Called the patient. He is feeling well. No abdominal pains. He has some baseline slightly loose stools. He is taking metamucil, supplemental digestive enzymes and probiotics to keep stools regular, and this is working for her. He states he had trauma from the prior NG placement during hospitalization, not sure if esophagitis noted on capsule is related or not. He will use some OTC pepsid or Zantac for the next month to help heal it.   Moving forward, capsule does not show any evidence of Crohn's disease. This, in combination with results from his prior surgery, all argue against Crohn's. Unclear if he may have had diverticulitis of his colon previously causing secondary inflammation of his small bowel, but that would be very unusual. He will monitor for recurrence of symptoms. If he has pain again or fever would have low threshold to reimage and give antibiotics.   I would like to see him again in 2-3 months. He agreed and will call in the interim with questions.  Raynelle FanningJulie can you please help book him a follow up appointment in February time frame. Thanks

## 2017-06-23 NOTE — Telephone Encounter (Signed)
Recall placed for 2-3 month office visit.

## 2017-07-03 ENCOUNTER — Encounter: Payer: Self-pay | Admitting: *Deleted

## 2017-07-03 DIAGNOSIS — J3089 Other allergic rhinitis: Secondary | ICD-10-CM | POA: Diagnosis not present

## 2017-07-03 NOTE — Progress Notes (Signed)
We received notification from [name] that he is interested in pursuing allergen immunotherapy. Prescriptions written and routed to the Immunotherapy Team.   Malachi BondsJoel Emanuele Mcwhirter, MD [facility] 

## 2017-07-03 NOTE — Addendum Note (Signed)
Addended by: Alfonse SpruceGALLAGHER, JOEL LOUIS on: 07/03/2017 06:44 AM   Modules accepted: Orders

## 2017-07-03 NOTE — Progress Notes (Signed)
4 VIAL SET MADE. EXP: 07-03-18. HV

## 2017-07-04 DIAGNOSIS — J302 Other seasonal allergic rhinitis: Secondary | ICD-10-CM | POA: Diagnosis not present

## 2017-07-06 ENCOUNTER — Telehealth: Payer: Self-pay | Admitting: Allergy & Immunology

## 2017-07-06 MED ORDER — AZELASTINE HCL 0.1 % NA SOLN: 1.0000 | Freq: Every day | NASAL | 3 refills | Status: DC | PRN

## 2017-07-06 MED ORDER — FLUTICASONE PROPIONATE 50 MCG/ACT NA SUSP: 1.0000 | Freq: Every day | NASAL | 3 refills | Status: DC | PRN

## 2017-07-06 NOTE — Telephone Encounter (Signed)
Sent in Flonase and azelastine to the pharmacy.

## 2017-07-06 NOTE — Telephone Encounter (Signed)
Pt called and needs to have Dymista called into [facility] [address]. [phone].

## 2017-07-10 ENCOUNTER — Ambulatory Visit (INDEPENDENT_AMBULATORY_CARE_PROVIDER_SITE_OTHER): Payer: BLUE CROSS/BLUE SHIELD

## 2017-07-10 DIAGNOSIS — J309 Allergic rhinitis, unspecified: Secondary | ICD-10-CM

## 2017-07-10 NOTE — Progress Notes (Signed)
Immunotherapy   Patient Details  Name: [name] MRN: [id number] Date of Birth: Mar 03, 1981  07/10/2017  [name] started injections for  mold,cr,grass,dm Following schedule: B  Frequency:1-2 times per week Epi-Pen:Prescription for Epi-Pen given Consent signed and patient instructions given.   Riley LamCatina Briton Sellman 07/10/2017, 2:13 PM

## 2017-07-17 ENCOUNTER — Telehealth: Payer: Self-pay

## 2017-07-17 ENCOUNTER — Ambulatory Visit (INDEPENDENT_AMBULATORY_CARE_PROVIDER_SITE_OTHER): Payer: BLUE CROSS/BLUE SHIELD

## 2017-07-17 DIAGNOSIS — J309 Allergic rhinitis, unspecified: Secondary | ICD-10-CM | POA: Diagnosis not present

## 2017-07-17 NOTE — Telephone Encounter (Signed)
Pt states that his mother-in-law is a Charity fundraiserN. He was wondering if she could be the one to give him the shots? They shots would be given at her home. Please advise.

## 2017-07-20 ENCOUNTER — Ambulatory Visit (INDEPENDENT_AMBULATORY_CARE_PROVIDER_SITE_OTHER): Payer: BLUE CROSS/BLUE SHIELD

## 2017-07-20 DIAGNOSIS — J309 Allergic rhinitis, unspecified: Secondary | ICD-10-CM | POA: Diagnosis not present

## 2017-07-20 NOTE — Telephone Encounter (Signed)
Yes, that will be fine. But please let him know that the first injection of each new vial will be done in the office, however.   Malachi BondsJoel Shelba Susi, MD [facility]

## 2017-07-22 NOTE — Telephone Encounter (Signed)
Pt advised. Will prepare paperwork for his mother-in-law to sign.

## 2017-07-24 ENCOUNTER — Ambulatory Visit (INDEPENDENT_AMBULATORY_CARE_PROVIDER_SITE_OTHER): Payer: BLUE CROSS/BLUE SHIELD

## 2017-07-24 DIAGNOSIS — J309 Allergic rhinitis, unspecified: Secondary | ICD-10-CM | POA: Diagnosis not present

## 2017-07-28 ENCOUNTER — Other Ambulatory Visit (INDEPENDENT_AMBULATORY_CARE_PROVIDER_SITE_OTHER): Payer: BLUE CROSS/BLUE SHIELD

## 2017-07-28 ENCOUNTER — Ambulatory Visit (INDEPENDENT_AMBULATORY_CARE_PROVIDER_SITE_OTHER): Payer: BLUE CROSS/BLUE SHIELD | Admitting: Physician Assistant

## 2017-07-28 ENCOUNTER — Encounter: Payer: Self-pay | Admitting: Physician Assistant

## 2017-07-28 VITALS — BP 126/76 | HR 96 | Ht 73.0 in | Wt 249.0 lb

## 2017-07-28 DIAGNOSIS — Z87898 Personal history of other specified conditions: Secondary | ICD-10-CM

## 2017-07-28 DIAGNOSIS — Z8719 Personal history of other diseases of the digestive system: Secondary | ICD-10-CM

## 2017-07-28 DIAGNOSIS — R109 Unspecified abdominal pain: Secondary | ICD-10-CM

## 2017-07-28 DIAGNOSIS — Z9049 Acquired absence of other specified parts of digestive tract: Secondary | ICD-10-CM | POA: Diagnosis not present

## 2017-07-28 LAB — SEDIMENTATION RATE: SED RATE: 3 mm/h (ref 0–15)

## 2017-07-28 LAB — HIGH SENSITIVITY CRP: CRP, High Sensitivity: 1.07 mg/L (ref 0.000–5.000)

## 2017-07-28 NOTE — Progress Notes (Signed)
Subjective:    Patient ID: [name], male    DOB: May 03, 1981, 37 y.o.   MRN: [id number]  HPI [name] is a pleasant 37 year old white male, known to Dr. Adela LankArmbruster and myself. He was last seen in the office in October 2018 after hospitalization in September with abdominal pain nausea and vomiting and was found to have a small bowel obstruction. CT scan at the time of that admission showed moderate inflammatory changes within the abdomen and upper pelvis with poorly defined inflammatory process in the right lower quadrant and upper pelvis centered around nondilated loops of small bowel. Could not rule out IBD. There was a dilated segment of small bowel in the upper pelvis and mild diffuse generalized inflammation in the peritoneal cavity. He was placed empirically on IV antibiotics and NG decompression. Follow-up CT scan 4 days later raised question of possible sigmoid diverticulitis with suspected fluid collection/abscess 11 cm in diameter along with thickening of the mid to distal small bowel. These images were reviewed by IR and after review was not felt that this fluid represented an abscess. Patient improved quickly was given a bowel prep and then underwent colonoscopy on 04/15/2017 with Dr. Adela LankArmbruster. This was a normal exam with and and ileocolonic anastomosis in the ascending colon and multiple left colon diverticuli, there was no evidence of diverticulitis and terminal ileum appeared normal. The TI was not able to be deeply intubated. Biopsies were taken and showed normal mucosa. Decision was made to proceed with capsule endoscopy to further investigate the possibility of IBD. Patient underwent capsule endoscopy on 06/08/2017. He had some evidence of esophagitis but otherwise this was a negative study, with good prep and no findings suggestive of inflammatory bowel disease. He comes back in today for follow-up. He says he has been doing well. He did have a brief episode around Christmas with  some constipation and abdominal cramping. He says this only lasted for a couple of days and he felt this was due to eating a lot of heavier an unusual foods for him. Once he was able to have a bowel movement his symptoms resolved and he has been feeling well since. Recalling his initial onset of symptoms were in January 2018 when he was admitted with a pneumoperitoneum and abdominal abscess as well as small bowel fistula of unknown etiology. He was treated initially with IV antibiotics and percutaneous drainage. He eventually had all drains removed by March 2018 and it was felt that the fistula had resolved. Unfortunately was readmitted in March 2018 with recurrent intra-abdominal abscesses with small bowel and mesenteric inflammation and ultimately required a diagnostic laparotomy and drainage of abdominal abscess and ileocecectomy per Dr. Rowan BlaseByerley. Path from the small bowel specimen was benign with serosal adhesions and serositis in areas of foreign body giant cell reaction presumably from prior perforation. He had a benign appendix, there was no evidence for IBD , dysplasia or malignancy.  Patient brought up today that he became acutely ill with his initial incident on 08/16/2016, the same day that he had eaten  Vilma MeckelJimmy Dean sausage which he had made at home. He says he was not sure if we were aware of this but that he had told the surgeons previously. He says he made sausage for sausage and biscuits that morning, and after he had eaten that he started eating a sausage patty, and says that he bit into something that he thinks was a hard piece of plastic. His wife also ate a sausage patty and pulled  a similar piece of questionable plastic out of hers. He says they were about the size of a fingernail clipping .They promptly threw  all the remaining meat away. Later that evening is when his initial symptoms started that took him to the hospital.   Review of Systems Pertinent positive and negative review of  systems were noted in the above HPI section.  All other review of systems was otherwise negative.  Outpatient Encounter Medications as of 07/28/2017  Medication Sig  . azelastine (ASTELIN) 0.1 % nasal spray Place 1 spray into both nostrils daily as needed for rhinitis.  Marland Kitchen DIGESTIVE ENZYMES PO Take by mouth daily.  Marland Kitchen FIBER PO Take 6 capsules by mouth per meal  . fluticasone (FLONASE) 50 MCG/ACT nasal spray Place 1 spray into both nostrils daily as needed for allergies or rhinitis.  Marland Kitchen loratadine (CLARITIN) 10 MG tablet Take 10 mg by mouth daily.  . Probiotic Product (PROBIOTIC ADVANCED PO) Take by mouth daily.   No facility-administered encounter medications on file as of 07/28/2017.    Allergies  Allergen Reactions  . Keflex [Cephalexin] Anaphylaxis and Rash  . Penicillins Other (See Comments)    Childhood Has patient had a PCN reaction causing immediate rash, facial/tongue/throat swelling, SOB or lightheadedness with hypotension: uknown Has patient had a PCN reaction causing severe rash involving mucus membranes or skin necrosis: unknown Has patient had a PCN reaction that required hospitalization unknown Has patient had a PCN reaction occurring within the last 10 years: uknown If all of the above answers are "NO", then may proceed with Cephalosporin use.   . Sulfa Antibiotics Other (See Comments)    Unknown. Childhood Allergy   Patient Active Problem List   Diagnosis Date Noted  . Seasonal and perennial allergic rhinitis 06/04/2017  . Adverse food reaction 06/04/2017  . History of small bowel obstruction 06/04/2017  . Abnormal CT of the abdomen   . Abdominal pain   . Sepsis (HCC) 04/06/2017  . Colitis 04/06/2017  . Pelvic abscess in male [facility]) 10/12/2016  . SBO (small bowel obstruction) (HCC) 08/21/2016  . Small bowel obstruction (HCC) 08/20/2016   Social History   Socioeconomic History  . Marital status: Married    Spouse name: Not on file  . Number of children: Not on file   . Years of education: Not on file  . Highest education level: Not on file  Social Needs  . Financial resource strain: Not on file  . Food insecurity - worry: Not on file  . Food insecurity - inability: Not on file  . Transportation needs - medical: Not on file  . Transportation needs - non-medical: Not on file  Occupational History  . Not on file  Tobacco Use  . Smoking status: Never Smoker  . Smokeless tobacco: Never Used  Substance and Sexual Activity  . Alcohol use: No    Alcohol/week: 0.0 oz  . Drug use: No  . Sexual activity: Yes    Comment: married, works at [employer]  Other Topics Concern  . Not on file  Social History Narrative  . Not on file    Mr. Demery family history includes Allergic rhinitis in his mother; Cancer in his paternal grandfather; Diabetes in his father; Mental illness in his mother.      Objective:    Vitals:   07/28/17 0828  BP: 126/76  Pulse: 96    Physical Exam well-developed white male in no acute distress, pleasant Blood pressure 126/76 pulse 96, height 6 foot  1, weight 249, BMI 32.8. 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 Eventually after all drains were removed in early March 2018 he was readmitted 10/12/2016 with recurrent intra-abdominal abscesses with small bowel and mesenteric inflammation and had  diagnostic laparotomy drainage of abdominal abscess and ileocecectomy per Dr. Rowan Blase. There was some question of underlying IBD but passed from the small bowel specimen was benign with serosal adhesions and serositis in the areas of foreign body giant cell reaction presumably from prior perforation. There was no evidence for IBD dysplasia or malignancy. Patient was readmitted September 2018 with small bowel obstruction which resolved quickly with NG decompression. He then underwent colonoscopy during that same admission which showed an end-to-end ileocolonic anastomosis in the ascending colon, multiple left colon diverticuli but no evidence of diverticulitis and the examined portion of the terminal ileum was normal. Subsequent capsule endoscopy done 11 2018 showed an unremarkable small bowel with no evidence of IBD. Patient is doing well currently. He is convinced that this point that his initial illness was caused by eating some small piece of plastic or other foreign body contained in the [employer] he had eaten earlier that same day.  There is no evidence for inflammatory bowel disease at this point, and never proven definitively that the patient had diverticulitis. It is certainly possible and likely that his initial inflammatory process and pneumoperitoneum were caused by a foreign body.  Plan; CBC with differential, sedimentation rate and CRP Patient is not having any active symptoms at this time, and does not need further GI workup at this point. We will observe him expectantly, and he is advised to call should he have any recurrent symptoms. He will follow-up with Dr. Adela Lank or myself on an as-needed basis.    Amy Oswald Hillock PA-C 07/28/2017   Cc: Donita Brooks, MD

## 2017-07-28 NOTE — Patient Instructions (Signed)
Please go to the basement level to have your labs drawn.  Follow up with Dr. Adela LankArmbruster as needed.  Call for any persistent abdominal pain.

## 2017-07-29 NOTE — Progress Notes (Signed)
Agree with assessment and plan as outlined. Unclear what has driven this presentation on this complex case. He initially presented with spontaneous small bowel abscess / fistula which required operative management, and surgical pathology was NOT consistent with Crohn's, but more so a foreign body reaction. In this light, it is possible his initial presentation could have been due to an ingested foreign body when eating sausage as he recalls. He had a subsequent recurrence of symptoms as outlined with a suspected small bowel obstruction. I had reviewed his CT scans extensively with surgical teams and radiology during that admission - it was not clear if he had a primary small bowel inflammatory process versus sigmoid diverticulitis during that admission. He improved with bowel rest and antibiotics. Subsequent evaluation with colonoscopy and capsule endoscopy has not shown any evidence of inflammatory bowel disease. He has since been doing pretty well without any significant recurrence of symptoms.   He will monitor symptoms over time, if he has any recurrence he needs to contact us, and would repeat cross sectional imaging. I would like to see him again in 6 months to ensure he is doing okay.

## 2017-07-31 ENCOUNTER — Ambulatory Visit (INDEPENDENT_AMBULATORY_CARE_PROVIDER_SITE_OTHER): Payer: BLUE CROSS/BLUE SHIELD

## 2017-07-31 DIAGNOSIS — J309 Allergic rhinitis, unspecified: Secondary | ICD-10-CM

## 2017-08-07 ENCOUNTER — Other Ambulatory Visit: Payer: Self-pay

## 2017-08-07 DIAGNOSIS — J309 Allergic rhinitis, unspecified: Secondary | ICD-10-CM

## 2017-08-07 MED ORDER — EPINEPHRINE 0.3 MG/0.3ML IJ SOAJ: 0.3000 mg | Freq: Once | INTRAMUSCULAR | 2 refills | Status: AC

## 2017-08-07 NOTE — Telephone Encounter (Signed)
Pt came by to pick up his blue allergy vials to be done at home by mother-in-law.   Vials include: Mold-CR Grass-DM  He was sent home with an instruction sheet and log sheet. His last dose was on 07/31/17 at 0.4 ml for both vials.  He will return next week to pick up his gold vials.

## 2017-08-07 NOTE — Progress Notes (Signed)
This encounter was created in error - please disregard.

## 2017-08-21 ENCOUNTER — Ambulatory Visit (INDEPENDENT_AMBULATORY_CARE_PROVIDER_SITE_OTHER): Payer: BLUE CROSS/BLUE SHIELD

## 2017-08-21 DIAGNOSIS — J309 Allergic rhinitis, unspecified: Secondary | ICD-10-CM

## 2017-08-21 NOTE — Progress Notes (Signed)
Immunotherapy   Patient Details  Name: [name] MRN: [id number] Date of Birth: Dec 15, 1980  08/21/2017   Pt was sent off with his new paperwork and his gold vials today. Pt was advised to come by next week to drop off his blue allergy vials. Pt acknowledged. He has had no issues with receiving his injections from his mother-in-law.   Amber Wood 08/21/2017, 12:08 PM

## 2017-10-02 ENCOUNTER — Ambulatory Visit (INDEPENDENT_AMBULATORY_CARE_PROVIDER_SITE_OTHER): Payer: BLUE CROSS/BLUE SHIELD

## 2017-10-02 DIAGNOSIS — J309 Allergic rhinitis, unspecified: Secondary | ICD-10-CM | POA: Diagnosis not present

## 2017-10-28 ENCOUNTER — Ambulatory Visit (INDEPENDENT_AMBULATORY_CARE_PROVIDER_SITE_OTHER): Payer: BLUE CROSS/BLUE SHIELD | Admitting: *Deleted

## 2017-10-28 DIAGNOSIS — J309 Allergic rhinitis, unspecified: Secondary | ICD-10-CM | POA: Diagnosis not present

## 2017-10-28 NOTE — Progress Notes (Signed)
Immunotherapy   Patient Details  Name: Ruben Haversathanael Kalan MRN: [id number] Date of Birth: 07-Aug-1980  10/28/2017  Ruben HaversNathanael Zaborowski here to pick up  mold-cr/grass-dm Following schedule: B  Frequency:2 times per week Epi-Pen:Epi-Pen Available  Consent signed and patient instructions given.n 1st injection from each vial given waited 30 min without difficulties. Paperwork and vials given to patient .   Bennye AlmMildred Filomeno Cromley 10/28/2017, 4:42 PM

## 2017-12-10 ENCOUNTER — Telehealth: Payer: Self-pay

## 2017-12-10 NOTE — Telephone Encounter (Signed)
Patient called and wanted to inform us that his mother has been giving him his allergy injections however she is not able to administer them any longer and he was wanting to know if the on site clinic at his job could and if there was anything that needed to be done. I informed patient that yes we would need consent from them stating that they will administer them. I have faxed the forms to his job and he will get it completed and sign and fax back.

## 2017-12-11 NOTE — Telephone Encounter (Signed)
Please review

## 2017-12-15 NOTE — Telephone Encounter (Deleted)
I will contact pt and inform of our shot hours in [place].

## 2018-02-22 ENCOUNTER — Ambulatory Visit (INDEPENDENT_AMBULATORY_CARE_PROVIDER_SITE_OTHER): Payer: BLUE CROSS/BLUE SHIELD | Admitting: *Deleted

## 2018-02-22 DIAGNOSIS — J309 Allergic rhinitis, unspecified: Secondary | ICD-10-CM | POA: Diagnosis not present

## 2018-02-22 NOTE — Progress Notes (Signed)
Immunotherapy   Patient Details  Name: Sallyanne Haversathanael Feig MRN: [id number] Date of Birth: 23-May-1981  02/22/2018  Sallyanne HaversNathanael Friend here to pick up  Grass-Dmite/Mold-Cr Red #1 1:100 Following schedule: B  Frequency:1 time per week Epi-Pen:Epi-Pen Available  Consent signed and patient instructions given. 1st injection given from each vial waited 30 min without difficulties. Paperwork and vials given to patient also advised on how to order next set of red vials and advised he needs to make a follow up appointment with Dr Dellis AnesGallagher. Patient verbalized understanding.   Bennye AlmMildred Iliza Blankenbeckler 02/22/2018, 4:34 PM

## 2018-03-29 ENCOUNTER — Encounter: Payer: Self-pay | Admitting: Allergy & Immunology

## 2018-03-29 ENCOUNTER — Ambulatory Visit (INDEPENDENT_AMBULATORY_CARE_PROVIDER_SITE_OTHER): Payer: BLUE CROSS/BLUE SHIELD | Admitting: Allergy & Immunology

## 2018-03-29 VITALS — BP 144/90 | HR 80 | Resp 16

## 2018-03-29 DIAGNOSIS — J3089 Other allergic rhinitis: Secondary | ICD-10-CM

## 2018-03-29 DIAGNOSIS — J302 Other seasonal allergic rhinitis: Secondary | ICD-10-CM | POA: Diagnosis not present

## 2018-03-29 NOTE — Progress Notes (Signed)
FOLLOW UP  Date of Service/Encounter:  03/29/18   Assessment:   Seasonal and perennial allergic rhinitis (grasses, molds, dust mite, cockroach) - on allergen immunotherapy  History of bowel obstruction  Plan/Recommendations:   1. Seasonal and perennial allergic rhinitis - Continue with allergy shots at the same schedule.  - Continue with Dymista 1-2 sprays per nostril nightly as needed.  - Continue with cetirizine 10mg  daily as needed. - Stop montelukast since you never started taking it.  [name] is up to date.  - I did ask [name] so  2. Return in about 1 year (around 03/30/2019).   Subjective:   [name] is a 37 y.o. male presenting today for follow up of  Chief Complaint  Patient presents with  . Allergies    [name] has a history of the following: Patient Active Problem List   Diagnosis Date Noted  . Seasonal and perennial allergic rhinitis 06/04/2017  . Adverse food reaction 06/04/2017  . History of small bowel obstruction 06/04/2017  . Abnormal CT of the abdomen   . Abdominal pain   . Sepsis (HCC) 04/06/2017  . Colitis 04/06/2017  . Pelvic abscess in male [facility]) 10/12/2016  . SBO (small bowel obstruction) (HCC) 08/21/2016  . Small bowel obstruction (HCC) 08/20/2016    History obtained from: chart review and patient.  [name]'s Primary Care Provider is Rogers, Ruben Heidelberg, MD.     [name] is a 37 y.o. male presenting for a follow up visit. He was last seen in November 2018. At that time, he underwent testing that demonstrated positives to grasses, molds, dust mite, and cockroach. We started him on Dymista 1-2 sprays per nostril daily as well as Singulair 10mg  daily and an OTC antihistamine. He was very interested in starting allergen immunotherapy and we did initiate this. He started the injections without adverse event. [name] - who is an Char[employer] fundraiser - administers the injections.   Since the last visit, he has done very well. He has  advanced and is now on the [employer]. He does report some large local reactions around the injection of the pollen containing vial. He does not have pictures of this. It does not seem that they ever adjust the dose with the reactions.   [name] is on allergen immunotherapy. He receives two injections. Immunotherapy script #1 contains molds and cockroach. He currently receives 0.35mL of the RED vial (1/100). Immunotherapy script #2 contains grasses and dust mites. He currently receives 0.65mL of the RED vial (1/100). He started shots December of 2018 and reached maintenance in August of 2019.  He is very pleased with how few sinus infections he has had since starting his immunotherapy. In fact, he has had none. Otherwise, there have been no changes to his past medical history, surgical history, family history, or social history.    Review of Systems: a 14-point review of systems is pertinent for what is mentioned in HPI.  Otherwise, all other systems were negative. Constitutional: negative other than that listed in the HPI Eyes: negative other than that listed in the HPI Ears, nose, mouth, throat, and face: negative other than that listed in the HPI Respiratory: negative other than that listed in the HPI Cardiovascular: negative other than that listed in the HPI Gastrointestinal: negative other than that listed in the HPI Genitourinary: negative other than that listed in the HPI Integument: negative other than that listed in the HPI Hematologic: negative other than that listed in the HPI Musculoskeletal:  negative other than that listed in the HPI Neurological: negative other than that listed in the HPI Allergy/Immunologic: negative other than that listed in the HPI    Objective:   Blood pressure (!) 144/90, pulse 80, resp. rate 16. There is no height or weight on file to calculate BMI.   Physical Exam:  General: Alert, interactive, in no acute distress. Pleasant male.  Eyes: No  conjunctival injection bilaterally, no discharge on the right, no discharge on the left and no Horner-Trantas dots present. PERRL bilaterally. EOMI without pain. No photophobia.  Ears: Right TM pearly gray with normal light reflex, Left TM pearly gray with normal light reflex, Right TM intact without perforation and Left TM intact without perforation.  Nose/Throat: nasal crease present and septum midline. Turbinates minimally edematous without discharge. Posterior oropharynx mildly erythematous without cobblestoning in the posterior oropharynx. Tonsils 2+ without exudates.  Tongue without thrush. Lungs: Clear to auscultation without wheezing, rhonchi or rales. No increased work of breathing. CV: Normal S1/S2. No murmurs. Capillary refill <2 seconds.  Skin: Warm and dry, without lesions or rashes. Neuro:   Grossly intact. No focal deficits appreciated. Responsive to questions.  Diagnostic studies: none      Ruben Bonds, MD  [facility]

## 2018-03-29 NOTE — Patient Instructions (Addendum)
1. Seasonal and perennial allergic rhinitis - Continue with allergy shots at the same schedule.  - Continue with Dymista 1-2 sprays per nostril nightly as needed.  - Continue with cetirizine 10mg  daily as needed. - Stop montelukast since you never started taking it.  [name] is up to date.   2. Return in about 1 year (around 03/30/2019).   Please inform us of any Emergency Department visits, hospitalizations, or changes in symptoms. Call us before going to the ED for breathing or allergy symptoms since we might be able to fit you in for a sick visit. Feel free to contact us anytime with any questions, problems, or concerns.  It was a pleasure to see you again today!  Websites that have reliable patient information: 1. American Academy of Asthma, Allergy, and Immunology: www.aaaai.org 2. Food Allergy Research and Education (FARE): foodallergy.org 3. Mothers of Asthmatics: http://www.asthmacommunitynetwork.org 4. American College of Allergy, Asthma, and Immunology: MissingWeapons.ca   Make sure you are registered to vote! If you have moved or changed any of your contact information, you will need to get this updated before voting!

## 2018-05-25 ENCOUNTER — Ambulatory Visit (INDEPENDENT_AMBULATORY_CARE_PROVIDER_SITE_OTHER): Payer: BLUE CROSS/BLUE SHIELD | Admitting: Gastroenterology

## 2018-05-25 ENCOUNTER — Other Ambulatory Visit (INDEPENDENT_AMBULATORY_CARE_PROVIDER_SITE_OTHER): Payer: BLUE CROSS/BLUE SHIELD

## 2018-05-25 ENCOUNTER — Encounter: Payer: Self-pay | Admitting: Gastroenterology

## 2018-05-25 VITALS — BP 124/74 | HR 108 | Ht 73.0 in | Wt 252.4 lb

## 2018-05-25 DIAGNOSIS — Z8719 Personal history of other diseases of the digestive system: Secondary | ICD-10-CM

## 2018-05-25 DIAGNOSIS — Z87898 Personal history of other specified conditions: Secondary | ICD-10-CM

## 2018-05-25 LAB — CBC WITH DIFFERENTIAL/PLATELET
BASOS PCT: 0.4 % (ref 0.0–3.0)
Basophils Absolute: 0 10*3/uL (ref 0.0–0.1)
EOS ABS: 0.1 10*3/uL (ref 0.0–0.7)
Eosinophils Relative: 0.9 % (ref 0.0–5.0)
HEMATOCRIT: 46.6 % (ref 39.0–52.0)
HEMOGLOBIN: 16.3 g/dL (ref 13.0–17.0)
LYMPHS PCT: 13 % (ref 12.0–46.0)
Lymphs Abs: 1.4 10*3/uL (ref 0.7–4.0)
MCHC: 35 g/dL (ref 30.0–36.0)
MCV: 92 fl (ref 78.0–100.0)
MONO ABS: 1.1 10*3/uL — AB (ref 0.1–1.0)
Monocytes Relative: 10.3 % (ref 3.0–12.0)
Neutro Abs: 8.3 10*3/uL — ABNORMAL HIGH (ref 1.4–7.7)
Neutrophils Relative %: 75.4 % (ref 43.0–77.0)
Platelets: 198 10*3/uL (ref 150.0–400.0)
RBC: 5.06 Mil/uL (ref 4.22–5.81)
RDW: 12.6 % (ref 11.5–15.5)
WBC: 11.1 10*3/uL — ABNORMAL HIGH (ref 4.0–10.5)

## 2018-05-25 MED ORDER — DICYCLOMINE HCL 10 MG PO CAPS: 10.0000 mg | ORAL_CAPSULE | Freq: Three times a day (TID) | ORAL | 1 refills | Status: DC | PRN

## 2018-05-25 NOTE — Patient Instructions (Addendum)
If you are age 37 or older, your body mass index should be between 23-30. Your Body mass index is 33.3 kg/m. If this is out of the aforementioned range listed, please consider follow up with your Primary Care Provider.  If you are age 90 or younger, your body mass index should be between 19-25. Your Body mass index is 33.3 kg/m. If this is out of the aformentioned range listed, please consider follow up with your Primary Care Provider.   We have sent the following medications to your pharmacy for you to pick up at your convenience: Bentyl  Your provider has requested that you go to the basement level for lab work before leaving today. Press "B" on the elevator. The lab is located at the first door on the left as you exit the elevator.  Follow up in 6 months. We will contact you closer to that time.   It was a pleasure to see you today!  Dr. Adela Lank

## 2018-05-25 NOTE — Progress Notes (Signed)
HPI :  37 year old male here for follow-up visit. 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 He was treated with percutaneous drainage initially and IV antibiotics. All drains were removed by March 2018 at which time the fistula had apparently resolved. Readmitted 10/12/2016 with recurrent intra-abdominal abscesses and bowel and mesenteric inflammation. He ultimately required a diagnostic laparotomy and drainage of abdominal abscess and ileocecectomy per Dr. Rowan Blase. Path from the small bowel specimen was benign with serosal adhesions and serositis and areas of foreign body giant cell reaction presumably from prior perforation, he had a benign appendix, no evidence for IBD, dysplasia or malignancy.  Readmitted in September 2018 with abdominal pain nausea and vomiting and was diagnosed with a small bowel obstruction. CT scan on 04/06/2017 showed moderate inflammatory changes within the abdomen and upper pelvis, poorly defined inflammatory process in the right lower quadrant/upper pelvis centered around nondilated loops of small bowel could not rule out IBD was a dilated segment of small bowel in the upper pelvis and mild diffuse generalized inflammation within the peritoneal cavity and central mesentery sigmoid colon diverticular disease. He was initially treated with antibiotics, and NG decompression. He had repeat CT scan on 04/10/2017 which raised question of possible sigmoid diverticulitis with suspected fluid collection/abscess 11 cm in diameter along with thickening of the mid to distal small bowel. Images were reviewed with IR or potential drainage and it was not felt that the fluid represented an abscess., and did not feel it would be accessible via IR approach. Patient improved quickly, tolerated a bowel prep and underwent  colonoscopy on 04/15/2017 with Dr. Adela Lank. This was a normal exam with end-to-end ileocolonic anastomosis in the ascending colon multiple left colon diverticuli no evidence of diverticulitis and he had a normal terminal ileum. TI was unable to be deeply intubated due to angulation. Biopsies were taken and these returned showing normal mucosa".  Since his last clinic visit with [place] he underwent a capsule endoscopy in November 2019 - the exam showed a normal small bowel transit and essentially normal mucosa - there was no evidence of any inflammatory changes or Crohn's disease.   We have been monitoring him since that time. He reports his energy has returned and he is since been able to exercise more and is more active. He does have some incisional pain in his abdomen which can bother him at times, he also has some episodic diarrhea at times. He took Imodium last week for diarrhea and became constipated and had some significant cramping with this. Outside of that he has not had much abdominal pain. He has some particular foods which can trigger some loose stools which she tries to avoid. He denies any blood in the stools. He is trying to lose weight and has been successful in losing about 10 pounds over the past 2 months. He has been using a fiber supplement to help control his bowels. Overall we discussed his course or a bit, he was concerned he had eaten a sausage that had a contained foreign-body which caused all of these issues leading to his operation.      Past Medical History:  Diagnosis Date  . Legionella pneumonia (HCC)    2009     Past Surgical History:  Procedure Laterality Date  . COLONOSCOPY WITH PROPOFOL N/A 04/15/2017   Procedure: COLONOSCOPY WITH PROPOFOL;  Surgeon: Adela Lank,  Willaim Rayas, MD;  Location: [facility];  Service: Gastroenterology;  Laterality: N/A;  . HERNIA REPAIR    . IR GENERIC HISTORICAL  09/09/2016   IR RADIOLOGIST EVAL & MGMT 09/09/2016 Berdine Dance, MD [facility]  INTERV RAD  . IR RADIOLOGIST EVAL & MGMT  09/23/2016  . KNEE SURGERY Left   . LAPAROSCOPIC SMALL BOWEL RESECTION N/A 10/16/2016   Procedure: LAPAROSCOPIC SMALL BOWEL RESECTION POSSIBLE EXPLORATORY LAPAROTOMY AND POSSIBLE OPEN WASH OUT ileocectomy;  Surgeon: Almond Lint, MD;  Location: [facility];  Service: General;  Laterality: N/A;   Family History  Problem Relation Age of Onset  . Diabetes Father   . Cancer Paternal Grandfather        oral cancer  . Mental illness Mother   . Allergic rhinitis Mother   . Angioedema Neg Hx   . Asthma Neg Hx   . Eczema Neg Hx    Social History   Tobacco Use  . Smoking status: Never Smoker  . Smokeless tobacco: Never Used  Substance Use Topics  . Alcohol use: No    Alcohol/week: 0.0 standard drinks  . Drug use: No   Current Outpatient Medications  Medication Sig Dispense Refill  . AMBULATORY NON FORMULARY MEDICATION Allergy injections Once a week    . azelastine (ASTELIN) 0.1 % nasal spray Place 1 spray into both nostrils daily as needed for rhinitis. 30 mL 3  . cetirizine (ZYRTEC) 10 MG tablet Take 10 mg by mouth daily.    Marland Kitchen FIBER PO Take 2-5 capsules by mouth per meal    . fluticasone (FLONASE) 50 MCG/ACT nasal spray Place 1 spray into both nostrils daily as needed for allergies or rhinitis. 16 g 3   No current facility-administered medications for this visit.    Allergies  Allergen Reactions  . Keflex [Cephalexin] Anaphylaxis and Rash  . Penicillins Other (See Comments)    Childhood Has patient had a PCN reaction causing immediate rash, facial/tongue/throat swelling, SOB or lightheadedness with hypotension: uknown Has patient had a PCN reaction causing severe rash involving mucus membranes or skin necrosis: unknown Has patient had a PCN reaction that required hospitalization unknown Has patient had a PCN reaction occurring within the last 10 years: uknown If all of the above answers are "NO", then may proceed with Cephalosporin use.   .  Sulfa Antibiotics Other (See Comments)    Unknown. Childhood Allergy     Review of Systems: All systems reviewed and negative except where noted in HPI.   Lab Results  Component Value Date   WBC 11.1 (H) 05/25/2018   HGB 16.3 05/25/2018   HCT 46.6 05/25/2018   MCV 92.0 05/25/2018   PLT 198.0 05/25/2018    Lab Results  Component Value Date   CREATININE 0.81 04/15/2017   BUN 7 04/15/2017   NA 137 04/15/2017   K 3.9 04/15/2017   CL 105 04/15/2017   CO2 23 04/15/2017    Lab Results  Component Value Date   ALT 27 04/08/2017   AST 15 04/08/2017   ALKPHOS 50 04/08/2017   BILITOT 3.3 (H) 04/08/2017     Physical Exam: BP 124/74 (BP Location: Left Arm, Patient Position: Sitting, Cuff Size: Normal)   Pulse (!) 108   Ht 6\' 1"  (1.854 m)   Wt 252 lb 6 oz (114.5 kg)   BMI 33.30 kg/m  Constitutional: Pleasant,well-developed,  male in no acute distress. HEENT: Normocephalic and atraumatic. Conjunctivae are normal. No scleral icterus. Neck supple.  Cardiovascular: Normal rate, regular  rhythm.  Pulmonary/chest: Effort normal and breath sounds normal. No wheezing, rales or rhonchi. Abdominal: Soft, nondistended, some mild incisional tenderness along midline lower abdomen. There are no masses palpable. No hepatomegaly. Extremities: no edema Lymphadenopathy: No cervical adenopathy noted. Neurological: Alert and oriented to person place and time. Skin: Skin is warm and dry. No rashes noted. Psychiatric: Normal mood and affect. Behavior is normal.   ASSESSMENT AND PLAN: 37 year old male here for reassessment of the following issues:  History of small bowel obstruction / history of intra-abdominal abscess - long history as outlined above with multiple hospitalizations and operation. Overall the pathology results from his operation showed a foreign body reaction. The patient subjectively feels this was due to a foreign body ingestion when he ate a bad piece of sausage. His follow-up  capsule endoscopy showed no evidence of Crohn's disease. He has some occasional loose stools and altered bowels depending on what he eats, but generally does not have much abdominal pain, occasional cramps. Overall he is significantly improved compared to previous and no recurrence since his last hospitalization over a year ago. Based on his initial presentation, while Crohn's disease was high on the differential, if he had Crohn's at this point I think it would continue to have issues by now without being medicated for IBD, and that has not been the case. I will check a CBC today to make sure this is stable. Otherwise I will have him back in 6 months for reassessment or sooner if any issues. In the interim I counseled him on a trial of a low FODMAP diet to see if that helps some of his bowel symptoms, I'll also provide Bentyl to use as needed for cramps.  Ileene Patrick, MD [facility]

## 2018-05-26 ENCOUNTER — Telehealth: Payer: Self-pay | Admitting: Gastroenterology

## 2018-05-26 NOTE — Telephone Encounter (Signed)
Spoke to patient, after he left here yesterday he started having a difficult time with sinus drainage and pressure. States mostly clear, draining in throat. He did take his temp and about 30 minutes ago it was 99.9, he had taken Tylenol prior to that. Patient also wanting to know about lab results.

## 2018-05-26 NOTE — Telephone Encounter (Signed)
Labs show no anemia which is good news. WBC mildly elevated - nonspecific, could be related to a viral URI or infection. If he is having a fever he should see his PCP to ensure no sinus infection. Otherwise I will see him in 6 months. Thanks

## 2018-05-27 NOTE — Telephone Encounter (Signed)
Patient advised of labs and recommendation to contact his PCP.

## 2018-06-03 DIAGNOSIS — J301 Allergic rhinitis due to pollen: Secondary | ICD-10-CM

## 2018-06-03 NOTE — Progress Notes (Signed)
VIALS EXP 06-04-19 

## 2018-06-16 ENCOUNTER — Ambulatory Visit (INDEPENDENT_AMBULATORY_CARE_PROVIDER_SITE_OTHER): Payer: BLUE CROSS/BLUE SHIELD | Admitting: *Deleted

## 2018-06-16 DIAGNOSIS — J309 Allergic rhinitis, unspecified: Secondary | ICD-10-CM

## 2018-06-16 NOTE — Progress Notes (Signed)
Immunotherapy   Patient Details  Name: [name] MRN: [id number] Date of Birth: 1981-06-14  06/16/2018  Ruben HaversNathanael Choyce here to pick up Red vial #1 <Grass-Dmite & Mold-CR> Following schedule: A  Frequency:1 time per week Epi-Pen:Epi-Pen Available  Consent signed and patient instructions given. No problems after 30 minutes in the office. Patient did not make it to the stop due to reactions. Previous records scanned into the chart.   Mariane DuvalHeather L Titus Drone 06/16/2018, 11:58 AM

## 2018-06-22 ENCOUNTER — Other Ambulatory Visit: Payer: Self-pay | Admitting: Allergy & Immunology

## 2018-07-02 ENCOUNTER — Encounter: Payer: Self-pay | Admitting: Family Medicine

## 2018-07-02 ENCOUNTER — Ambulatory Visit (INDEPENDENT_AMBULATORY_CARE_PROVIDER_SITE_OTHER): Payer: BLUE CROSS/BLUE SHIELD | Admitting: Family Medicine

## 2018-07-02 VITALS — BP 128/72 | HR 100 | Temp 98.5°F | Resp 16 | Ht 73.0 in | Wt 248.0 lb

## 2018-07-02 DIAGNOSIS — Z0000 Encounter for general adult medical examination without abnormal findings: Secondary | ICD-10-CM

## 2018-07-02 DIAGNOSIS — Z23 Encounter for immunization: Secondary | ICD-10-CM | POA: Diagnosis not present

## 2018-07-02 LAB — COMPLETE METABOLIC PANEL WITH GFR
AG Ratio: 1.9 (calc) (ref 1.0–2.5)
ALBUMIN MSPROF: 4.6 g/dL (ref 3.6–5.1)
ALKALINE PHOSPHATASE (APISO): 75 U/L (ref 40–115)
ALT: 81 U/L — ABNORMAL HIGH (ref 9–46)
AST: 41 U/L — ABNORMAL HIGH (ref 10–40)
BUN: 21 mg/dL (ref 7–25)
CHLORIDE: 105 mmol/L (ref 98–110)
CO2: 25 mmol/L (ref 20–32)
Calcium: 9.9 mg/dL (ref 8.6–10.3)
Creat: 0.85 mg/dL (ref 0.60–1.35)
GFR, Est African American: 129 mL/min/{1.73_m2} (ref >=60)
GFR, Est Non African American: 111 mL/min/{1.73_m2} (ref >=60)
GLUCOSE: 91 mg/dL (ref 65–99)
Globulin: 2.4 g/dL (calc) (ref 1.9–3.7)
Potassium: 4.4 mmol/L (ref 3.5–5.3)
Sodium: 140 mmol/L (ref 135–146)
Total Bilirubin: 2 mg/dL — ABNORMAL HIGH (ref 0.2–1.2)
Total Protein: 7 g/dL (ref 6.1–8.1)

## 2018-07-02 LAB — LIPID PANEL
CHOLESTEROL: 169 mg/dL (ref <200)
HDL: 40 mg/dL — ABNORMAL LOW (ref >40)
LDL CHOLESTEROL (CALC): 108 mg/dL — AB
Non-HDL Cholesterol (Calc): 129 mg/dL (calc) (ref <130)
Total CHOL/HDL Ratio: 4.2 (calc) (ref <5.0)
Triglycerides: 113 mg/dL (ref <150)

## 2018-07-02 NOTE — Progress Notes (Signed)
Subjective:    Patient ID: [name], male    DOB: 1980/10/30, 37 y.o.   MRN: [id number]  HPI  04/2017 The patient has a very complicated PMH.  He was originally admitted on January 31 with pneumoperitoneum secondary to microperforation due to foreign body.  This was treated by surgery with percutaneous drain. CT showed resolution of pneumoperitoneum and he was discharged on February 7 and the last drain was removed on March 6. He was then readmitted on March 25 and was found to have an abscess in the right mid pelvis. He underwent diagnostic laparoscopy and drainage by surgery and was discharged on April 2. He had done well until 04/05/2002 admitted with sepsis due to colitis with small bowel obstruction. I have copied the CT results and included them below for my reference: 9/17 Moderate inflammatory changes within the abdomen and upper pelvis with poorly defined inflammatory process in the right lower quadrant/upper pelvis. This appears to be centered around non dilated loops of small bowel, suggesting small bowel inflammation, possibly due to inflammatory bowel disease. Dilated segment of small bowel within the upper pelvis could relate to localized ileus. Mild diffuse generalized inflammation within the peritoneal cavity/central mesentery. No free air at this time. No organized abscess. There are mild changes of the transverse colon which could be due to mild colitis. 2. Sigmoid colon diverticular disease. 3. Hepatic steatosis. Stable small hyperenhancing focus in the dome of the liver, possibly a flash hemangioma. 9/21 Multiple previous examinations have been reviewed and I am concerned the patient has experienced a recurrent episode of acute diverticulitis secondary to a small diverticulum arising from the anterior aspect of the sigmoid colon. This small diverticulum is associated with several small foci of pneumoperitoneum and appears to contain a serpiginous communication  to a developing suspected abscess within the root of the abdominal mesentery measuring approximately 11.6 cm in diameter. 2. Interval progression of circumferential bowel wall thickening involving several loops of mid and distal small bowel, not resulting in enteric obstruction - while potentially secondary to a concomitant inflammatory etiology (such as Crohn's colitis), I favor these loops are secondarily involved due to their proximity to the suspected developing abscess within the root of the abdominal Mesentery. Patient was treated with several weeks of antibiotics and was discharged from the hospital on September 27. Patient had a colonoscopy prior to discharge from the hospital that was reportedly negative and showed no evidence of Crohn's colitis he is still perplexed as to the reason this continues to happen. He is yet to follow-up with GI. He would like a referral to see an allergist. He believes that he has food allergies that may be triggering this. Particularly he becomes extremely nauseated and experiences reflux nausea and abdominal pain whenever he eats chicken.  He denies any bloody diarrhea.  At that time, my plan was: I will gladly consult an allergist although as I explained to the patient, I do not feel food allergies are the root of his problem.  I feel one of 2 processes are at play. Either the patient has Crohn's colitis and is having recurrent exacerbations that lead to infections/abscess or patient continues to have episodic leakage from a microperforation in a diverticuli that was the original event in March but continues to have frequent exacerbations requiring hospitalization. I have recommended that he follow-up with GI for capsule endoscopy to rule out inflammatory bowel disease.  If the patient continues to have recurrent diverticulitis with pelvic abscess, he may require  partial bowel resection of the involved portion of the sigmoid colon with a microperforation occurred.  I will defer to GI and surgery at the present time. I will consult an allergist per the patient's request  07/02/18 Patient is here today for a complete physical exam.  He states that he somewhat feels back to his normal self now.  He states that once or twice a month he will get severe crampy intestinal pain which he treats with Bentyl.  This will be followed by some diarrhea however the Bentyl helps ease the pain and make it abate over a few minutes.  Otherwise he is doing well.  He denies any fevers chills weight loss or hematochezia.  He is otherwise doing well.  His flu shot is up-to-date.  He is due for a tetanus shot.  Due to his age he does not require any cancer screening.  He had a CBC obtained at his gastroenterologist office that was significant for a white blood cell count of 11.1 however he states that he had a sinus infection that day.  Overall he is doing well.  He denies any fevers chills or night sweats or body aches.  Past Medical History:  Diagnosis Date  . Legionella pneumonia (HCC)    2009   Past Surgical History:  Procedure Laterality Date  . COLONOSCOPY WITH PROPOFOL N/A 04/15/2017   Procedure: COLONOSCOPY WITH PROPOFOL;  Surgeon: Benancio Deeds, MD;  Location: [facility];  Service: Gastroenterology;  Laterality: N/A;  . HERNIA REPAIR    . IR GENERIC HISTORICAL  09/09/2016   IR RADIOLOGIST EVAL & MGMT 09/09/2016 Berdine Dance, MD [facility] INTERV RAD  . IR RADIOLOGIST EVAL & MGMT  09/23/2016  . KNEE SURGERY Left   . LAPAROSCOPIC SMALL BOWEL RESECTION N/A 10/16/2016   Procedure: LAPAROSCOPIC SMALL BOWEL RESECTION POSSIBLE EXPLORATORY LAPAROTOMY AND POSSIBLE OPEN WASH OUT ileocectomy;  Surgeon: Almond Lint, MD;  Location: [facility]S;  Service: General;  Laterality: N/A;   Current Outpatient Medications on File Prior to Visit  Medication Sig Dispense Refill  . AMBULATORY NON FORMULARY MEDICATION Allergy injections Once a week    . azelastine (ASTELIN) 0.1 % nasal spray  PLACE 1 SPRAY INTO BOTH NOSTRILS DAILY AS NEEDED FOR RHINITIS. 30 mL 5  . cetirizine (ZYRTEC) 10 MG tablet Take 10 mg by mouth daily.    Marland Kitchen dicyclomine (BENTYL) 10 MG capsule Take 1 capsule (10 mg total) by mouth every 8 (eight) hours as needed for spasms. 30 capsule 1  . FIBER PO Take 2-5 capsules by mouth per meal    . fluticasone (FLONASE) 50 MCG/ACT nasal spray PLACE 1 SPRAY INTO BOTH NOSTRILS DAILY AS NEEDED FOR ALLERGIES OR RHINITIS. 16 g 5   No current facility-administered medications on file prior to visit.    Allergies  Allergen Reactions  . Keflex [Cephalexin] Anaphylaxis and Rash  . Penicillins Other (See Comments)    Childhood Has patient had a PCN reaction causing immediate rash, facial/tongue/throat swelling, SOB or lightheadedness with hypotension: uknown Has patient had a PCN reaction causing severe rash involving mucus membranes or skin necrosis: unknown Has patient had a PCN reaction that required hospitalization unknown Has patient had a PCN reaction occurring within the last 10 years: uknown If all of the above answers are "NO", then may proceed with Cephalosporin use.   . Sulfa Antibiotics Other (See Comments)    Unknown. Childhood Allergy   Social History   Socioeconomic History  . Marital status: Married  Spouse name: Not on file  . Number of children: Not on file  . Years of education: Not on file  . Highest education level: Not on file  Occupational History  . Not on file  Social Needs  . Financial resource strain: Not on file  . Food insecurity:    Worry: Not on file    Inability: Not on file  . Transportation needs:    Medical: Not on file    Non-medical: Not on file  Tobacco Use  . Smoking status: Never Smoker  . Smokeless tobacco: Never Used  Substance and Sexual Activity  . Alcohol use: No    Alcohol/week: 0.0 standard drinks  . Drug use: No  . Sexual activity: Yes    Comment: married, works at [employer]  Lifestyle  . Physical activity:     Days per week: Not on file    Minutes per session: Not on file  . Stress: Not on file  Relationships  . Social connections:    Talks on phone: Not on file    Gets together: Not on file    Attends religious service: Not on file    Active member of club or organization: Not on file    Attends meetings of clubs or organizations: Not on file    Relationship status: Not on file  . Intimate partner violence:    Fear of current or ex partner: Not on file    Emotionally abused: Not on file    Physically abused: Not on file    Forced sexual activity: Not on file  Other Topics Concern  . Not on file  Social History Narrative  . Not on file    Review of Systems  All other systems reviewed and are negative.      Objective:   Physical Exam  Constitutional: He is oriented to person, place, and time. He appears well-developed and well-nourished. No distress.  HENT:  Head: Normocephalic and atraumatic.  Right Ear: External ear normal.  Left Ear: External ear normal.  Nose: Nose normal.  Mouth/Throat: Oropharynx is clear and moist. No oropharyngeal exudate.  Eyes: Pupils are equal, round, and reactive to light. Conjunctivae and EOM are normal. Right eye exhibits no discharge. Left eye exhibits no discharge. No scleral icterus.  Neck: Normal range of motion. Neck supple. No JVD present. No tracheal deviation present. No thyromegaly present.  Cardiovascular: Normal rate, regular rhythm, normal heart sounds and intact distal pulses. Exam reveals no gallop and no friction rub.  No murmur heard. Pulmonary/Chest: Effort normal and breath sounds normal. No stridor. No respiratory distress. He has no wheezes. He has no rales. He exhibits no tenderness.  Abdominal: Soft. Bowel sounds are normal. He exhibits no distension and no mass. There is no abdominal tenderness. There is no rebound and no guarding.  Musculoskeletal: Normal range of motion.        General: No tenderness, deformity or edema.    Lymphadenopathy:    He has no cervical adenopathy.  Neurological: He is alert and oriented to person, place, and time. He has normal reflexes. He displays normal reflexes. No cranial nerve deficit. He exhibits normal muscle tone. Coordination normal.  Skin: Skin is warm. No rash noted. He is not diaphoretic. No erythema. No pallor.  Psychiatric: He has a normal mood and affect. His behavior is normal. Judgment and thought content normal.  Vitals reviewed.  Surgical scar in lower pelvis.       Assessment & Plan:  General  medical exam - Plan: COMPLETE METABOLIC PANEL WITH GFR, Lipid panel Patient's physical exam is normal.  His blood pressure is excellent.  He received his tetanus shot.  I will also check a CMP and a fasting lipid panel.  Ideally for his height I would like to see his weight close to 200 pounds.  If we can get him under 220 pounds, I believe that that would be reasonable.  Patient is starting to work on diet and exercise to help achieve this.  Otherwise he is doing very well.  I am glad to see his gastrointestinal issues have improved.

## 2018-07-02 NOTE — Addendum Note (Signed)
Addended by: Legrand RamsWILLIS, SANDY B on: 07/02/2018 12:26 PM   Modules accepted: Orders

## 2018-08-04 ENCOUNTER — Telehealth: Payer: Self-pay | Admitting: *Deleted

## 2018-08-04 NOTE — Telephone Encounter (Signed)
Patient called stating he has a sinus infection that will not go away, patient states he took predisone for it but it hasn't helped. Please advise

## 2018-08-04 NOTE — Telephone Encounter (Signed)
Patient states if Dr Dellis Anes can't call something in, he doesn't mind driving to [place] to be seen.

## 2018-08-04 NOTE — Telephone Encounter (Signed)
Dr. Gallagher, please advise. 

## 2018-08-05 ENCOUNTER — Encounter: Payer: Self-pay | Admitting: Allergy

## 2018-08-05 ENCOUNTER — Ambulatory Visit (INDEPENDENT_AMBULATORY_CARE_PROVIDER_SITE_OTHER): Payer: BLUE CROSS/BLUE SHIELD | Admitting: Allergy

## 2018-08-05 VITALS — BP 130/76 | HR 99 | Temp 98.5°F | Resp 17 | Ht 73.0 in | Wt 249.0 lb

## 2018-08-05 DIAGNOSIS — J302 Other seasonal allergic rhinitis: Secondary | ICD-10-CM | POA: Diagnosis not present

## 2018-08-05 DIAGNOSIS — J3089 Other allergic rhinitis: Secondary | ICD-10-CM | POA: Diagnosis not present

## 2018-08-05 DIAGNOSIS — R42 Dizziness and giddiness: Secondary | ICD-10-CM

## 2018-08-05 MED ORDER — FLUTICASONE PROPIONATE 93 MCG/ACT NA EXHU: 2.0000 | INHALANT_SUSPENSION | Freq: Two times a day (BID) | NASAL | 5 refills | Status: DC

## 2018-08-05 NOTE — Patient Instructions (Addendum)
1. Seasonal and perennial allergic rhinitis  Fax over the allergy shot schedule that you have been getting. ([phone])  Hold dose for next week at red 0.4cc.  Start Xhance 2 sprays twice a day. This replaces Flonase.  May still use azelastine 1 spray twice a day as needed for runny nose and postnasal drip.  May use over the counter antihistamines such as Zyrtec (cetirizine), Claritin (loratadine), Allegra (fexofenadine), or Xyzal (levocetirizine) daily as needed.  If your vertigo is not better let us know.  Follow up in 2 months

## 2018-08-05 NOTE — Assessment & Plan Note (Signed)
Past history - 2018 skin testing was positive to grass, mold, cockroach and dust mites. Interim history - tolerating buildup allergy injections however noticing some dizziness after shots for the last few weeks.  Fax over the allergy shot schedule that you have been getting.  Hold dose for next week at red 0.4cc and monitor symptoms.   Start Xhance 2 sprays twice a day. This replaces Flonase.  Demonstrated proper use and sample given.  May still use azelastine 1 spray twice a day as needed for runny nose and postnasal drip.  May use over the counter antihistamines such as Zyrtec (cetirizine), Claritin (loratadine), Allegra (fexofenadine), or Xyzal (levocetirizine) daily as needed.

## 2018-08-05 NOTE — Assessment & Plan Note (Signed)
Dizziness started after a sinus infection in December 2019. Treated with prednisone and meclizine which helped but now noticing flare after allergy injections. No ear pain or hearing loss. No recent ENT evaluation.  Patient may have some eustachian tube dysfunction contributing to the dizziness.  Changed nasal spray around. Declines oral decongestants as they cause palpitations.  No indication for any prednisone or antibiotics at this time.  Will hold allergy injection dose at same dose for the next week.  If symptoms not improved then advised patient to let us know.

## 2018-08-05 NOTE — Progress Notes (Signed)
Follow Up Note  RE: [name] MRN: [id number] DOB: August 11, 1980 Date of Office Visit: 08/05/2018  Referring provider: Donita Brooks, MD Primary care provider: Donita Brooks, MD  Chief Complaint: Dizziness  History of Present Illness: I had the pleasure of seeing [name] for a follow up visit at the [facility] on 08/05/2018. He is a 38 y.o. male, who is being followed for allergic rhinitis. Today he is here for new complaint of dizziness. His previous allergy office visit was on 03/29/2018 with Dr. Dellis Anes.   He had a sinus infection in late November which cleared up with antibiotics but the some of the symptoms started to come back around Christmas with vertigo, headache and sinus pressure. He went to the clinic at work and was prescribed prednisone and meclizine which helped. A few weeks ago he noticed the vertigo coming and going. It seems to flare the day after his allergy injections and improves over the week. He is still having some sinus pressure and pain. No drainage and no post nasal drip. Denies any fevers or chills.   Currently on allergy injections weekly, Flonase + azelastine 1 spray once a day, zyrtec 10mg  daily. Does not tolerate decongestants as they cause palpitations. No vomiting but dizziness worsens with head movement.   He had vertigo about 10+ years ago secondary to sinus infection.   No recent ENT evaluation. No issues with hearing.  Last injection was on 08/02/2017 red 0.4cc.   Assessment and Plan: [name] is a 38 y.o. male with: Seasonal and perennial allergic rhinitis Past history - 2018 skin testing was positive to grass, mold, cockroach and dust mites. Interim history - tolerating buildup allergy injections however noticing some dizziness after shots for the last few weeks.  Fax over the allergy shot schedule that you have been getting.  Hold dose for next week at red 0.4cc and monitor symptoms.   Start Xhance 2  sprays twice a day. This replaces Flonase.  Demonstrated proper use and sample given.  May still use azelastine 1 spray twice a day as needed for runny nose and postnasal drip.  May use over the counter antihistamines such as Zyrtec (cetirizine), Claritin (loratadine), Allegra (fexofenadine), or Xyzal (levocetirizine) daily as needed.    Dizziness Dizziness started after a sinus infection in December 2019. Treated with prednisone and meclizine which helped but now noticing flare after allergy injections. No ear pain or hearing loss. No recent ENT evaluation.  Patient may have some eustachian tube dysfunction contributing to the dizziness.  Changed nasal spray around. Declines oral decongestants as they cause palpitations.  No indication for any prednisone or antibiotics at this time.  Will hold allergy injection dose at same dose for the next week.  If symptoms not improved then advised patient to let us know.   Return in about 2 months (around 10/04/2018).  Meds ordered this encounter  Medications  . Fluticasone Propionate (XHANCE) 93 MCG/ACT EXHU    Sig: Place 2 sprays into the nose 2 (two) times daily.    Dispense:  32 mL    Refill:  5    [phone] (M)   Diagnostics: None.  Medication List:  Current Outpatient Medications  Medication Sig Dispense Refill  . AMBULATORY NON FORMULARY MEDICATION Allergy injections Once a week    . azelastine (ASTELIN) 0.1 % nasal spray PLACE 1 SPRAY INTO BOTH NOSTRILS DAILY AS NEEDED FOR RHINITIS. 30 mL 5  . cetirizine (ZYRTEC) 10 MG tablet  Take 10 mg by mouth daily.    Marland Kitchen dicyclomine (BENTYL) 10 MG capsule Take 1 capsule (10 mg total) by mouth every 8 (eight) hours as needed for spasms. 30 capsule 1  . FIBER PO Take 2-5 capsules by mouth per meal    . meclizine (ANTIVERT) 25 MG tablet TAKE 1 TABLET BY MOUTH 3 TIMES DAILY AS NEEDED FOR UP TO 10 DAYS FOR DIZZINESS.    Marland Kitchen Fluticasone Propionate (XHANCE) 93 MCG/ACT EXHU Place 2 sprays into the  nose 2 (two) times daily. 32 mL 5   No current facility-administered medications for this visit.    Allergies: Allergies  Allergen Reactions  . Keflex [Cephalexin] Anaphylaxis and Rash  . Penicillins Other (See Comments)    Childhood Has patient had a PCN reaction causing immediate rash, facial/tongue/throat swelling, SOB or lightheadedness with hypotension: uknown Has patient had a PCN reaction causing severe rash involving mucus membranes or skin necrosis: unknown Has patient had a PCN reaction that required hospitalization unknown Has patient had a PCN reaction occurring within the last 10 years: uknown If all of the above answers are "NO", then may proceed with Cephalosporin use.   . Sulfa Antibiotics Other (See Comments)    Unknown. Childhood Allergy   I reviewed his past medical history, social history, family history, and environmental history and no significant changes have been reported from previous visit on 03/29/2018.  Review of Systems  Constitutional: Negative for appetite change, chills, fever and unexpected weight change.  HENT: Positive for sinus pressure. Negative for congestion and rhinorrhea.   Eyes: Negative for itching.  Respiratory: Negative for cough, chest tightness, shortness of breath and wheezing.   Gastrointestinal: Negative for abdominal pain.  Skin: Negative for rash.  Allergic/Immunologic: Positive for environmental allergies.  Neurological: Positive for dizziness and headaches.   Objective: BP 130/76 (BP Location: Left Arm, Patient Position: Sitting, Cuff Size: Normal)   Pulse 99   Temp 98.5 F (36.9 C) (Oral)   Resp 17   Ht 6\' 1"  (1.854 m)   Wt 249 lb (112.9 kg)   SpO2 97%   BMI 32.85 kg/m  Body mass index is 32.85 kg/m. Physical Exam  Constitutional: He is oriented to person, place, and time. He appears well-developed and well-nourished.  HENT:  Head: Normocephalic and atraumatic.  Right Ear: External ear normal.  Left Ear: External  ear normal.  Nose: Nose normal.  Mouth/Throat: Oropharynx is clear and moist.  Eyes: Conjunctivae and EOM are normal.  Neck: Neck supple.  Cardiovascular: Normal rate, regular rhythm and normal heart sounds. Exam reveals no gallop and no friction rub.  No murmur heard. Pulmonary/Chest: Effort normal and breath sounds normal. He has no wheezes. He has no rales.  Lymphadenopathy:    He has no cervical adenopathy.  Neurological: He is alert and oriented to person, place, and time.  Skin: Skin is warm. No rash noted.  Psychiatric: He has a normal mood and affect. His behavior is normal.  Nursing note and vitals reviewed.  Previous notes and tests were reviewed. The plan was reviewed with the patient/family, and all questions/concerned were addressed.  It was my pleasure to see Kristian today and participate in his care. Please feel free to contact me with any questions or concerns.  Sincerely,  Wyline Mood, DO Allergy & Immunology  [facility] office: [phone] [facility] office: [phone]

## 2018-08-06 NOTE — Telephone Encounter (Signed)
Thanks for seeing him, Dr. Selena Batten!   Malachi Bonds, MD [facility]

## 2018-08-18 IMAGING — CT CT ABD-PELV W/ CM
2 of 4 series · 15 of 46 positions shown, 17 images · IV contrast (ISOVUE)
Comparison: None.

CLINICAL DATA: Abdominal pain, vomiting

EXAM:
CT ABDOMEN AND PELVIS WITH CONTRAST
TECHNIQUE: Multidetector CT imaging of the abdomen and pelvis was performed
using the standard protocol following bolus administration of
intravenous contrast.
CONTRAST:  100mL 4F3RHK-3MM IOPAMIDOL (4F3RHK-3MM) INJECTION 61%

[Series 2: abd/pel with · axial · 0.82mm/px · z∈[+777,+1217]mm · 12 of 106 slices shown, 14 images]
[im 9/106  soft-tissue]
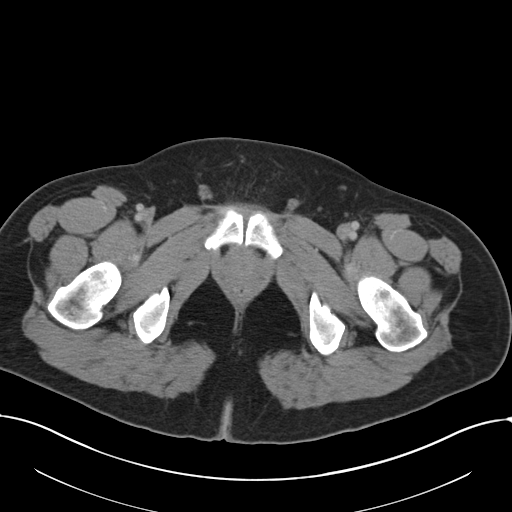
[im 9/106  bone]
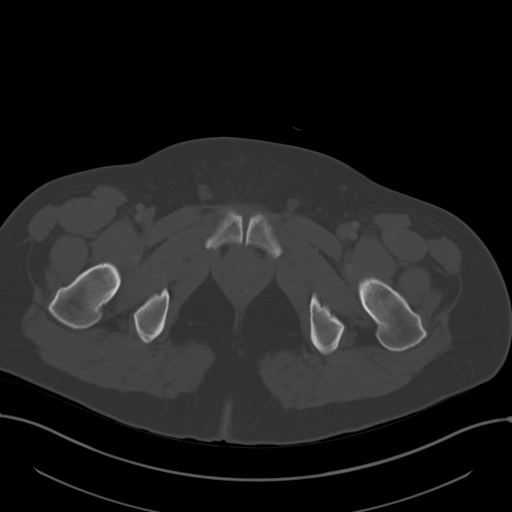
[im 17/106  soft-tissue]
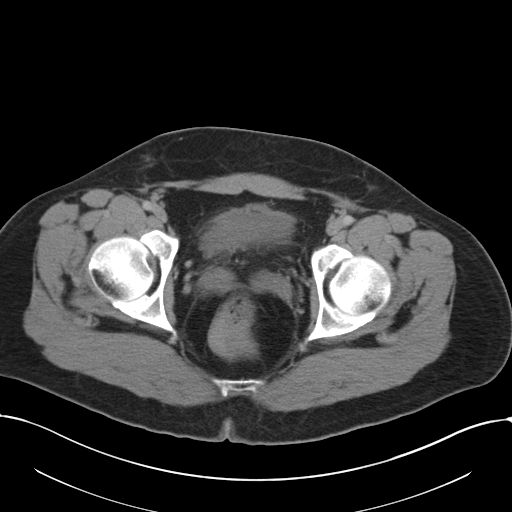
[im 25/106  soft-tissue]
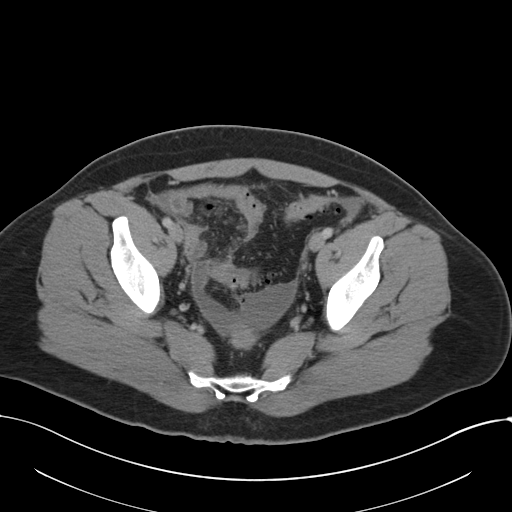
[im 33/106  soft-tissue]
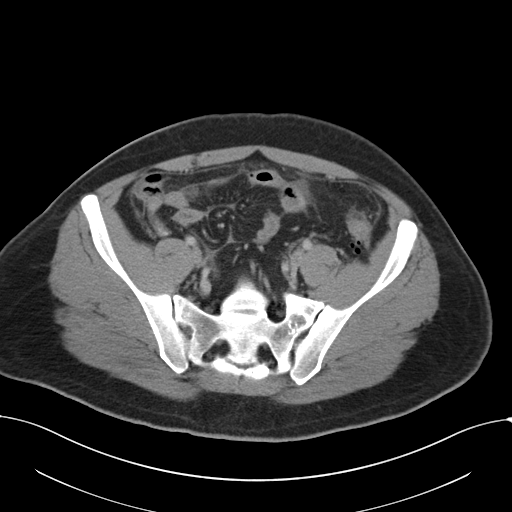
[im 41/106  soft-tissue]
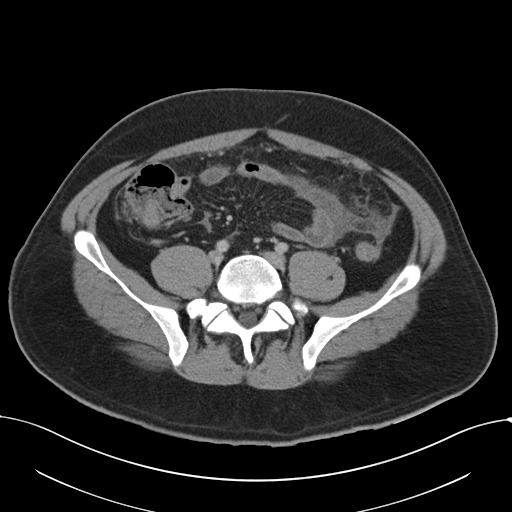
[im 49/106  soft-tissue]
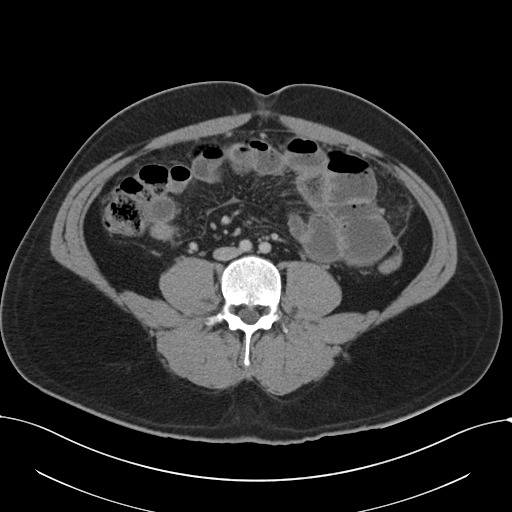
[im 57/106  soft-tissue]
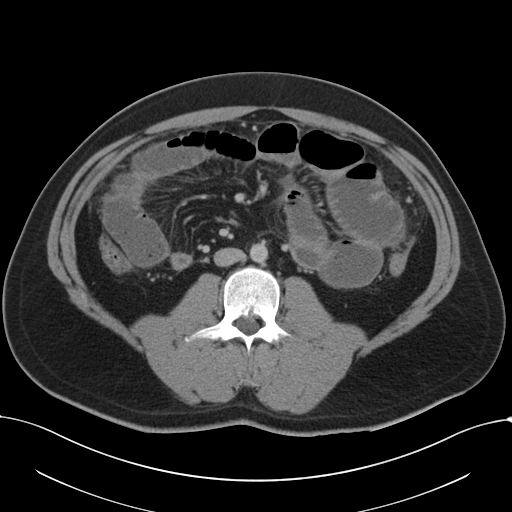
[im 65/106  soft-tissue]
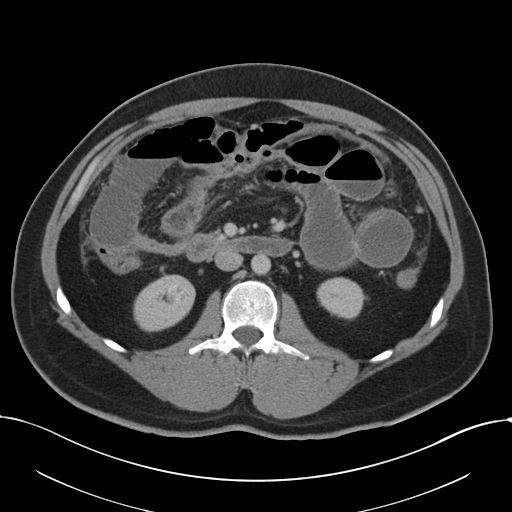
[im 73/106  soft-tissue]
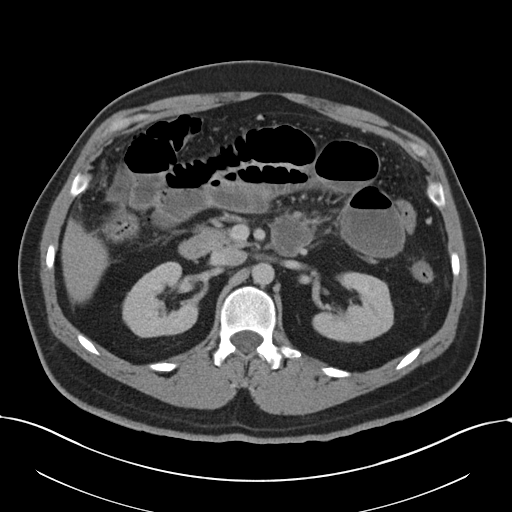
[im 73/106  bone]
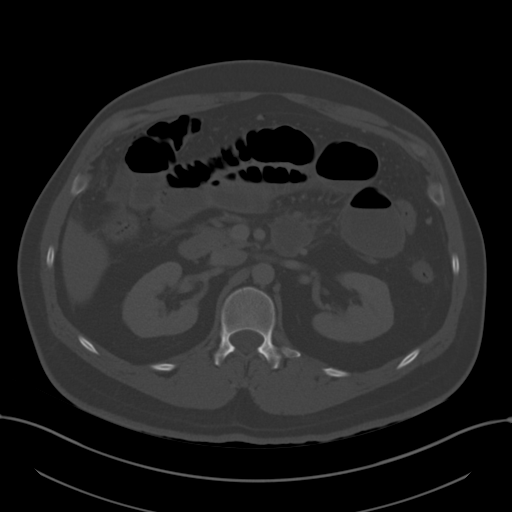
[im 81/106  soft-tissue]
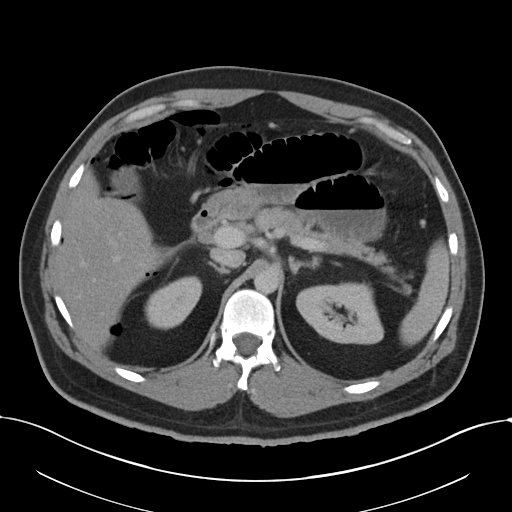
[im 89/106  soft-tissue]
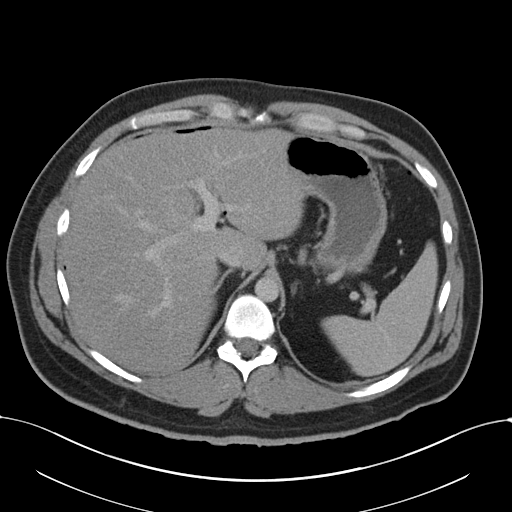
[im 97/106  soft-tissue]
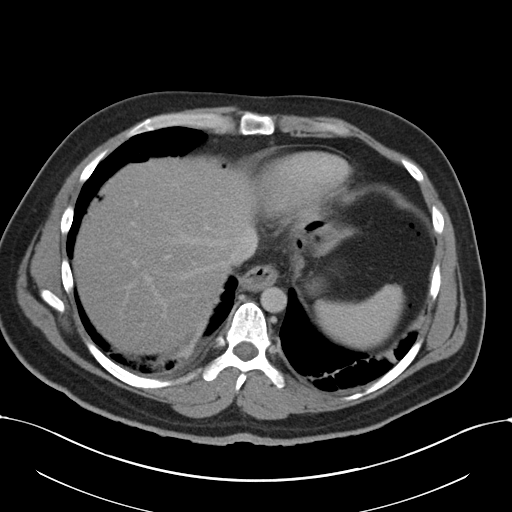

[Series 3: coronal a/|p · coronal · 0.82mm/px · 3 of 130 slices shown]
[im 44/130  soft-tissue]
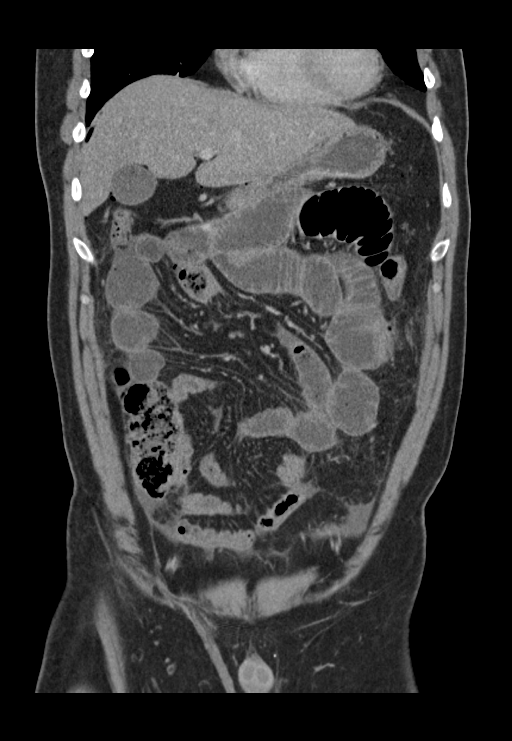
[im 58/130  soft-tissue]
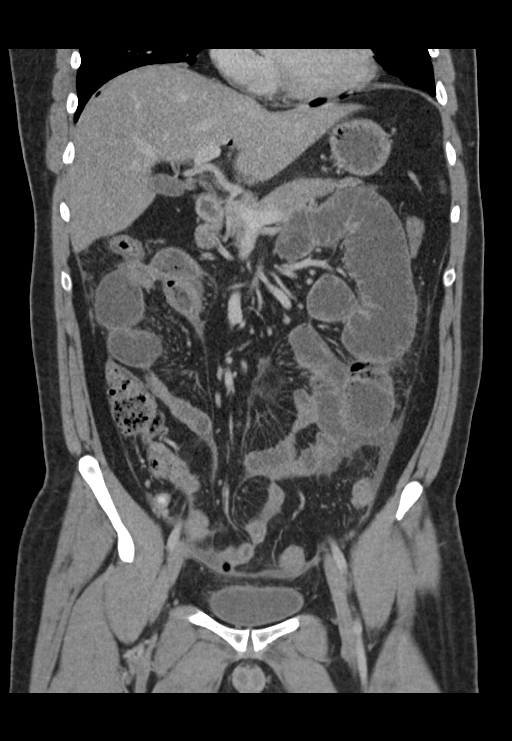
[im 72/130  soft-tissue]
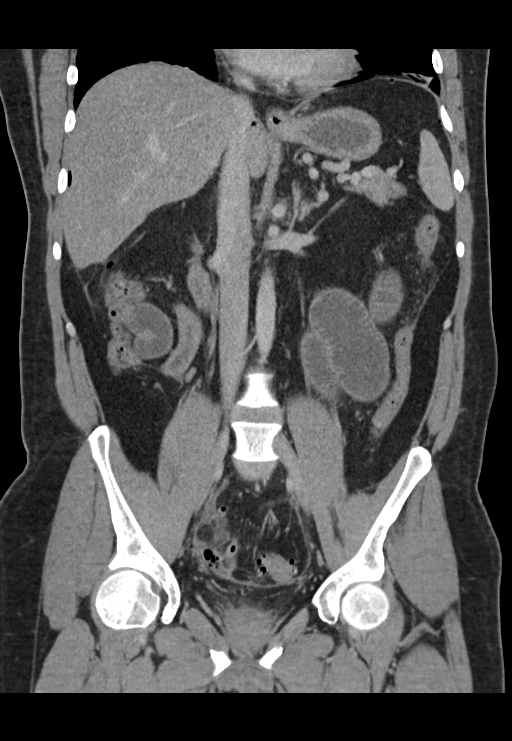

[15 of 46 positions shown; findings below may reference images not displayed]

FINDINGS: Lower chest: Airspace opacities noted in both lung bases, likely
atelectasis although early infiltrates cannot be excluded
particularly in the right lung base. Trace right pleural effusion.
Heart is normal size.

Hepatobiliary: No focal hepatic abnormality. Gallbladder
unremarkable.

Pancreas: No focal abnormality or ductal dilatation.

Spleen: No focal abnormality.  Normal size.

Adrenals/Urinary Tract: No adrenal abnormality. No focal renal
abnormality. No stones or hydronephrosis. Urinary bladder is
unremarkable.

Stomach/Bowel: Left colonic diverticulosis. No active
diverticulitis. There are dilated fluid-filled small bowel loops in
the abdomen and into the pelvis. Distal small bowel loops are
decompressed. Exact transition point is not visualized, but findings
are compatible with small bowel obstruction. In addition, there is
free air in the abdomen concerning for bowel perforation.

Vascular/Lymphatic: No evidence of aneurysm or adenopathy.

Reproductive: No visible focal abnormality.

Other: Moderate free fluid in the pelvis. Free air noted in the
abdomen as mentioned previously.

Musculoskeletal: No acute bony abnormality or focal bone lesion.
IMPRESSION: Dilated small bowel loops with decompressed distal small bowel loops
compatible with small bowel obstruction.

Free air and free fluid in the abdomen and pelvis compatible with
bowel perforation. Exact source/ site not visualized.

Colonic diverticulosis.  No active diverticulitis.

Predominately linear densities in the lung bases, likely atelectasis
although early infiltrates cannot be excluded. Trace right pleural
effusion.

Critical Value/emergent results were called by telephone at the time
of interpretation on 08/20/2016 at [DATE] to Dr. MILUSKA GENOVA , who
verbally acknowledged these results.

## 2018-09-21 IMAGING — RF DG SINUS / FISTULA TRACT / ABSCESSOGRAM
3 series · 8 of 8 positions shown · IV contrast (agent unspecified)
Comparison: Injection 09/09/2016, CT 09/09/2016 and 08/26/2016

CLINICAL DATA: 35-year-old male with a history of pelvic abscess,
status post CT-guided drainage through trans gluteal drain, now
removed, as well as an anterior drain placed.

He returns today for injection
EXAM:
ABSCESS INJECTION
CONTRAST:  10 cc
FLUOROSCOPY TIME:  Radiation Exposure Index (if provided by the
fluoroscopic device): 26.4 mGy

[Series 1: one shot · 1 of 1 slices shown (1 of 2)]
[im 1/1]
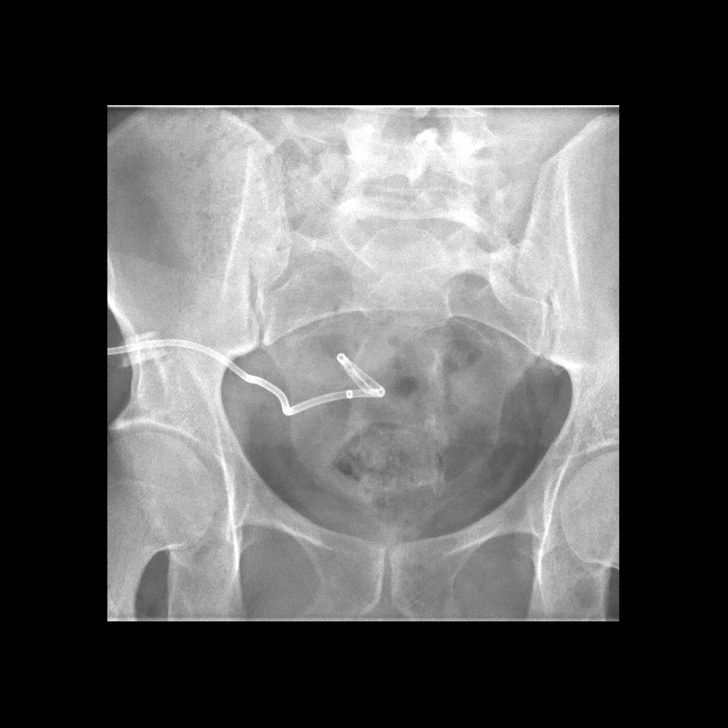

[Series 2: sequence · 4 of 59 frames shown]
[frame 9/59]
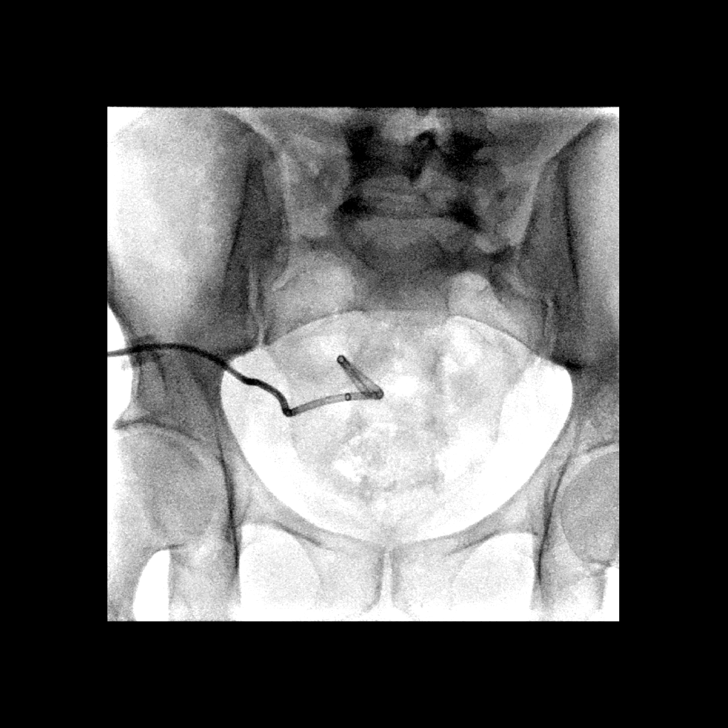
[frame 25/59]
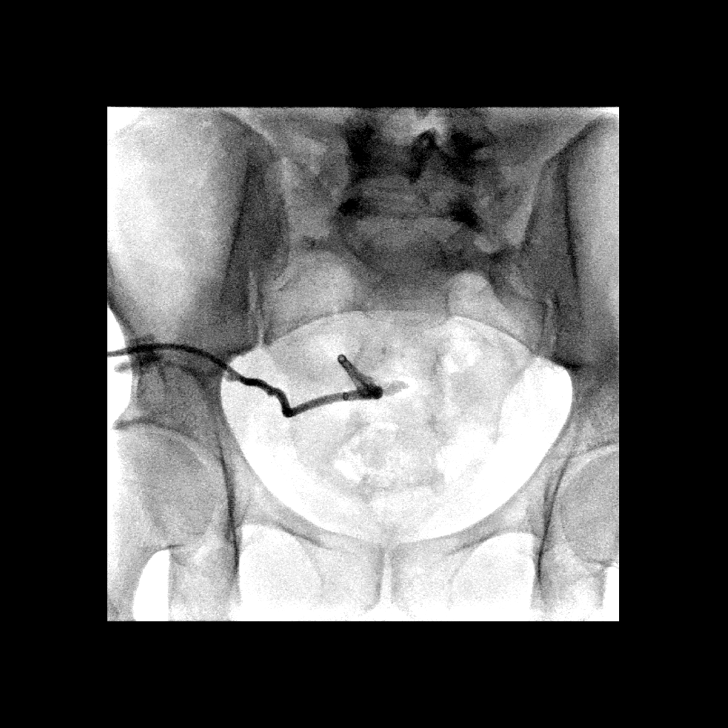
[frame 30/59]
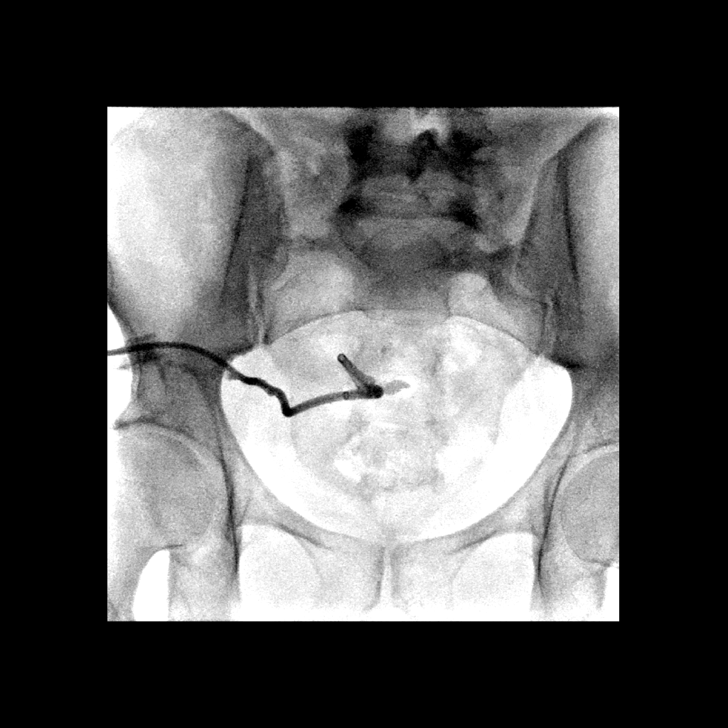
[frame 51/59]
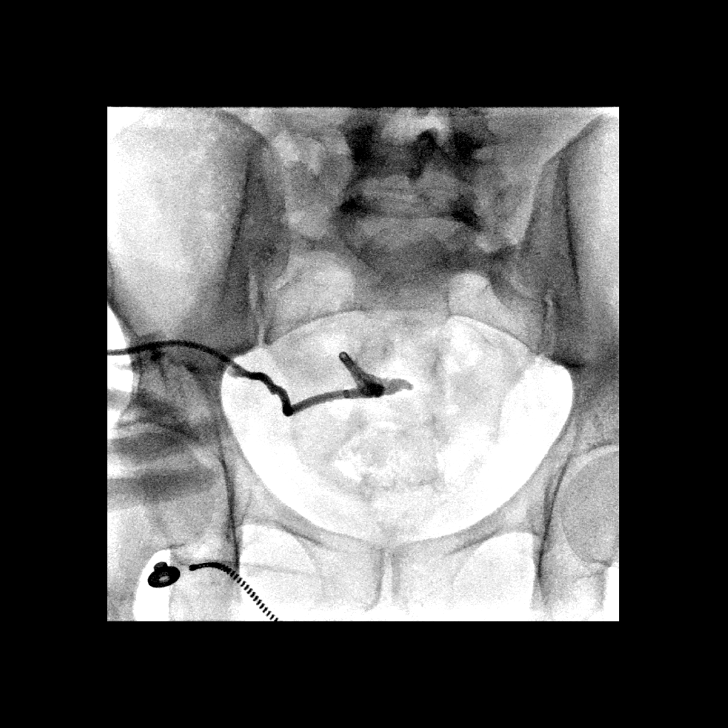

[Series 3: one shot · 3 of 3 slices shown (2 of 2)]
[im 1/3]
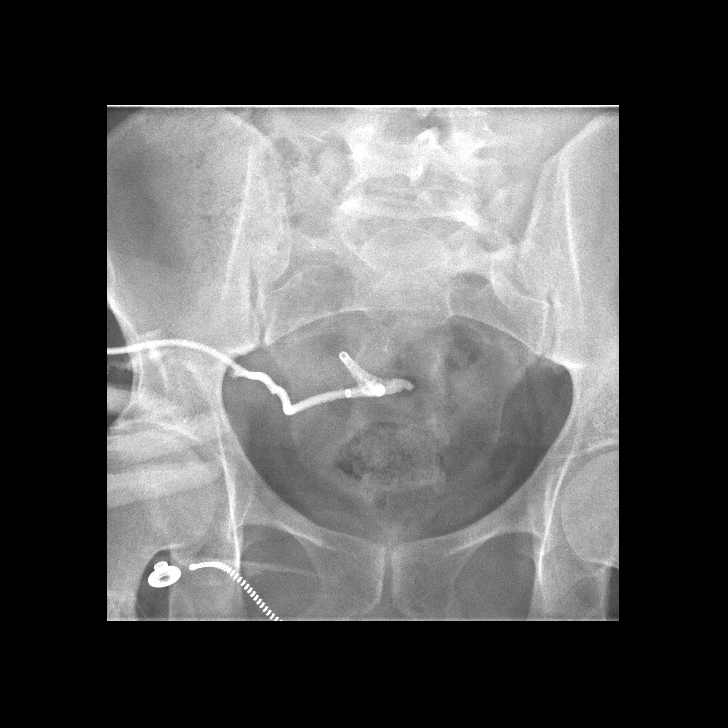
[im 2/3]
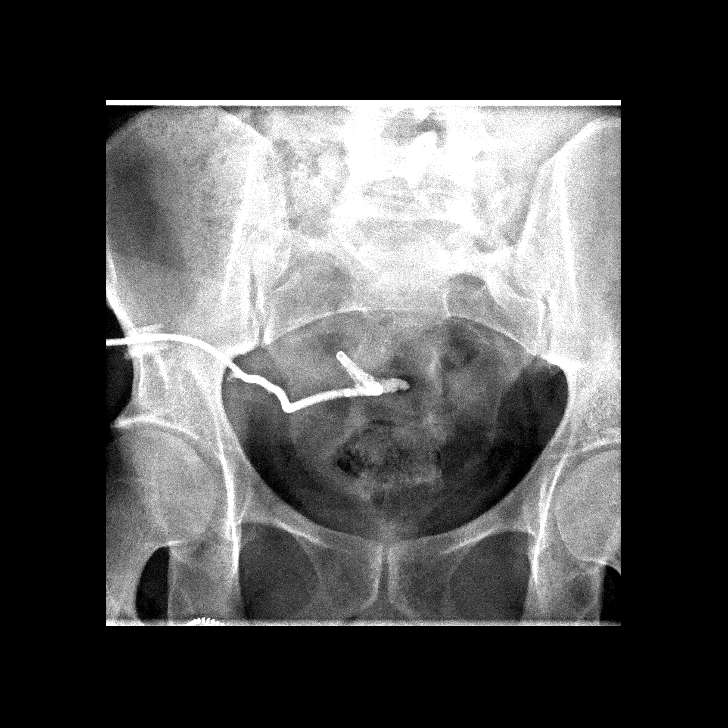
[im 3/3]
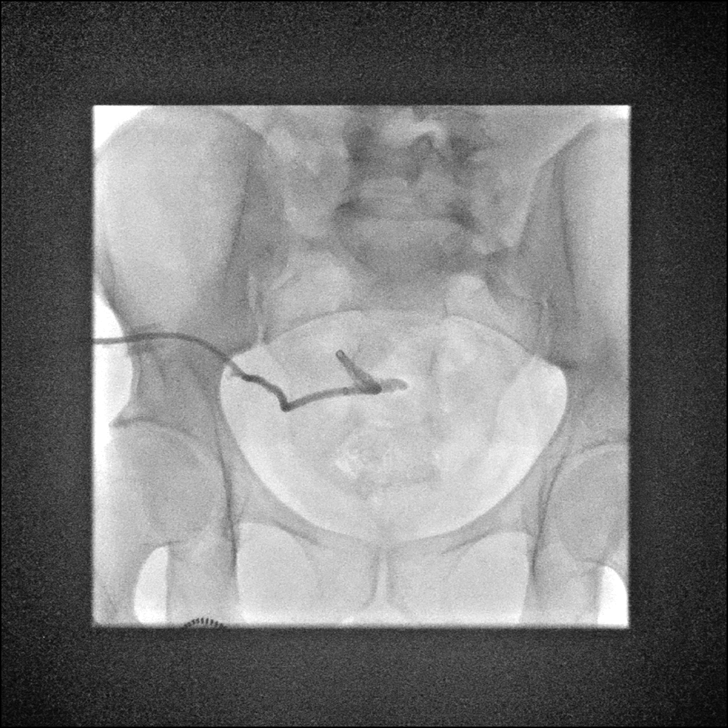

[8 of 8 positions shown; findings below may reference images not displayed]

FINDINGS: Patient positioned supine position on the fluoroscopy table.

Scout image of the pelvis performed.

The abscess cavity drain was then injected with contrast.

At the conclusion, findings were discussed with Yamaguchi. Tiger of
surgery and the drain was removed at the bedside.
IMPRESSION: Status post injection of abscess drain demonstrating no evidence of
fistula and collapse of the abscess cavity.

Drain was removed at the bedside.

## 2018-09-24 ENCOUNTER — Telehealth: Payer: Self-pay | Admitting: *Deleted

## 2018-09-24 NOTE — Telephone Encounter (Signed)
Patient called stating that he just finished his red vials at home and needs to come in and pick up his new red vials. An appointment has been made for him to come pick up his new vials on 10/08/2018. Please advise.

## 2018-09-27 NOTE — Progress Notes (Signed)
VIALS EXP 09-27-2019

## 2018-09-28 DIAGNOSIS — J301 Allergic rhinitis due to pollen: Secondary | ICD-10-CM

## 2018-10-06 ENCOUNTER — Ambulatory Visit: Payer: BLUE CROSS/BLUE SHIELD | Admitting: Allergy

## 2018-10-08 ENCOUNTER — Ambulatory Visit: Payer: Self-pay | Admitting: *Deleted

## 2018-10-08 ENCOUNTER — Other Ambulatory Visit: Payer: Self-pay

## 2018-10-08 ENCOUNTER — Ambulatory Visit (INDEPENDENT_AMBULATORY_CARE_PROVIDER_SITE_OTHER): Payer: BLUE CROSS/BLUE SHIELD | Admitting: *Deleted

## 2018-10-08 DIAGNOSIS — J309 Allergic rhinitis, unspecified: Secondary | ICD-10-CM

## 2018-10-08 NOTE — Progress Notes (Signed)
Immunotherapy   Patient Details  Name: [name] MRN: [id number] Date of Birth: Jan 14, 1981  10/08/2018  [name] here to pick up  MOLD-CR and G-DM Red VIALS. Following schedule: C  Frequency: Weekly, Every 2 Weeks at 0.50 Epi-Pen:Epi-Pen Available   Pt came in to pick up new red vials today. Pt received first dose of .38mL of MOLD-CR in RUA and .5mL of G-DM in LUA. Pt waited 30 minutes and did not experience any issues. Pt took vials home to be administered by mother-in-law which is an Charity fundraiser.  Ashleigh Fernandez-Vernon 10/08/2018, 3:56 PM

## 2018-11-22 NOTE — Progress Notes (Signed)
Prescreened pt for 5-5 appt/ confirmed doximity

## 2018-11-23 ENCOUNTER — Ambulatory Visit (INDEPENDENT_AMBULATORY_CARE_PROVIDER_SITE_OTHER): Payer: BLUE CROSS/BLUE SHIELD | Admitting: Gastroenterology

## 2018-11-23 ENCOUNTER — Encounter: Payer: Self-pay | Admitting: Gastroenterology

## 2018-11-23 ENCOUNTER — Other Ambulatory Visit: Payer: Self-pay

## 2018-11-23 VITALS — Ht 74.0 in | Wt 235.0 lb

## 2018-11-23 DIAGNOSIS — Z8719 Personal history of other diseases of the digestive system: Secondary | ICD-10-CM | POA: Diagnosis not present

## 2018-11-23 DIAGNOSIS — R748 Abnormal levels of other serum enzymes: Secondary | ICD-10-CM

## 2018-11-23 DIAGNOSIS — Z87898 Personal history of other specified conditions: Secondary | ICD-10-CM | POA: Diagnosis not present

## 2018-11-23 MED ORDER — DICYCLOMINE HCL 10 MG PO CAPS: 10.0000 mg | ORAL_CAPSULE | Freq: Three times a day (TID) | ORAL | 3 refills | Status: DC | PRN

## 2018-11-23 NOTE — Patient Instructions (Signed)
If you are age 38 or older, your body mass index should be between 23-30. Your Body mass index is 30.17 kg/m. If this is out of the aforementioned range listed, please consider follow up with your Primary Care Provider.  If you are age 77 or younger, your body mass index should be between 19-25. Your Body mass index is 30.17 kg/m. If this is out of the aformentioned range listed, please consider follow up with your Primary Care Provider.   Please go to the lab in the basement of our building to have lab work done. Hit "B" for basement when you get on the elevator.  When the doors open the lab is on your left.  We will call you with the results. Thank you.  We have sent the following medications to your pharmacy for you to pick up at your convenience: Bentyl 10mg   Thank you for entrusting me with your care and for choosing [facility], Dr. Ileene Patrick

## 2018-11-23 NOTE — Progress Notes (Signed)
Virtual Visit via Video Note  I connected with [name] on 11/23/18 at  8:30 AM EDT by a video enabled telemedicine application and verified that I am speaking with the correct person using two identifiers.  I discussed the limitations of evaluation and management by telemedicine and the availability of in person appointments. The patient expressed understanding and agreed to proceed.  THIS ENCOUNTER IS A VIRTUAL VISIT DUE TO COVID-19 - PATIENT WAS NOT SEEN IN THE OFFICE. PATIENT HAS CONSENTED TO VIRTUAL VISIT / TELEMEDICINE VISIT VIA DOXIMITY APP   Location of patient: home Location of provider: office Persons participating: myself, patient   HPI :  38 year old male here for follow-up visit. 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 He was treated with percutaneous drainage initially and IV antibiotics. All drains were removed by March 2018 at which time the fistula had apparently resolved. Readmitted 10/12/2016 with recurrent intra-abdominal abscesses and bowel and mesenteric inflammation. He ultimately required a diagnostic laparotomy and drainage of abdominal abscess and ileocecectomy per Dr. Rowan Blase. Path from the small bowel specimen was benign with serosal adhesions and serositis and areas of foreign body giant cell reaction presumably from prior perforation, he had a benign appendix, no evidence for IBD, dysplasia or malignancy.    Readmitted in September 2018 with abdominal pain nausea and vomiting and was diagnosed with a small bowel obstruction. CT scan on 04/06/2017 showed moderate inflammatory changes within the abdomen and upper pelvis, poorly defined inflammatory process in the right lower quadrant/upper pelvis centered around nondilated loops of small bowel could not rule out IBD was a dilated segment  of small bowel in the upper pelvis and mild diffuse generalized inflammation within the peritoneal cavity and central mesentery sigmoid colon diverticular disease. He was initially treated with antibiotics, and NG decompression. He had repeat CT scan on 04/10/2017 which raised question of possible sigmoid diverticulitis with suspected fluid collection/abscess 11 cm in diameter along with thickening of the mid to distal small bowel. Images were reviewed with IR or potential drainage and it was not felt that the fluid represented an abscess., and did not feel it would be accessible via IR approach. Patient improved quickly, tolerated a bowel prep and underwent colonoscopy on 04/15/2017 with Dr. Adela Lank. This was a normal exam with end-to-end ileocolonic anastomosis in the ascending colon, multiple left colon diverticuli no evidence of diverticulitis and he had a normal terminal ileum. TI was unable to be deeply intubated due to angulation. Biopsies were taken and these returned showing normal mucosa".  Capsule endoscopy in November 2019 - the exam showed a normal small bowel transit and essentially normal mucosa - there was no evidence of any inflammatory changes or Crohn's disease.    Since the last visit with me in early November 2019 he has been doing well. He's had no recurrence of any severe symptoms of episodes which bothered him previously. He does have some sporadic cramps at times, he thinks it can often be produced by specific trigger foods, such as fresh vegetables. He has been using bentyl PRN and it works really well for him when he takes it, aborts the cramps quickly. No trouble of constipation or diarrhea. No blood in the stools. He thinks certain foods can trigger his symptoms at times, tries to avoid fried foods. No obstructions or hospitalizations. Weight is stable, trying to  lose weight. He is trying to work with a Systems analyst to help with that.   Of note he had LFTs done by PCP in  December, found to have newly elevated ALT to 81 and AST to 41. T bil noted to be 2.0 but not fractionated. He has had some sporadic elevations in bili in the past (?Gilbert"s?), but don't see that it has been fractionated. He denies any history of known liver disease or FH of liver disease.    Past Medical History:  Diagnosis Date  . Legionella pneumonia (HCC)    2009     Past Surgical History:  Procedure Laterality Date  . COLONOSCOPY WITH PROPOFOL N/A 04/15/2017   Procedure: COLONOSCOPY WITH PROPOFOL;  Surgeon: Benancio Deeds, MD;  Location: [facility] END[facility]COPY;  Service: Gastroenterology;  Laterality: N/A;  . HERNIA REPAIR    . IR GENERIC HISTORICAL  09/09/2016   IR RADIOLOGIST EVAL & MGMT 09/09/2016 Berdine Dance, MD [facility] INTERV RAD  . IR RADIOLOGIST EVAL & MGMT  09/23/2016  . KNEE SURGERY Left   . LAPAROSCOPIC SMALL BOWEL RESECTION N/A 10/16/2016   Procedure: LAPAROSCOPIC SMALL BOWEL RESECTION POSSIBLE EXPLORATORY LAPAROTOMY AND POSSIBLE OPEN WASH OUT ileocectomy;  Surgeon: Almond Lint, MD;  Location: [facility] ORS;  Service: General;  Laterality: N/A;   Family History  Problem Relation Age of Onset  . Diabetes Father   . Cancer Paternal Grandfather        oral cancer  . Mental illness Mother   . Allergic rhinitis Mother   . Angioedema Neg Hx   . Asthma Neg Hx   . Eczema Neg Hx   . Colon cancer Neg Hx   . Esophageal cancer Neg Hx   . Stomach cancer Neg Hx    Social History   Tobacco Use  . Smoking status: Never Smoker  . Smokeless tobacco: Never Used  Substance Use Topics  . Alcohol use: No    Alcohol/week: 0.0 standard drinks  . Drug use: No   Current Outpatient Medications  Medication Sig Dispense Refill  . AMBULATORY NON FORMULARY MEDICATION Allergy injections Every other week    . azelastine (ASTELIN) 0.1 % nasal spray PLACE 1 SPRAY INTO BOTH NOSTRILS DAILY AS NEEDED FOR RHINITIS. 30 mL 5  . dicyclomine (BENTYL) 10 MG capsule Take 1 capsule (10 mg total) by mouth  every 8 (eight) hours as needed for spasms. 30 capsule 1  . fexofenadine (ALLEGRA) 180 MG tablet Take 180 mg by mouth daily.    Marland Kitchen FIBER PO Take 2-5 capsules by mouth per meal    . meclizine (ANTIVERT) 25 MG tablet TAKE 1 TABLET BY MOUTH 3 TIMES DAILY AS NEEDED FOR UP TO 10 DAYS FOR DIZZINESS.     No current facility-administered medications for this visit.    Allergies  Allergen Reactions  . Keflex [Cephalexin] Anaphylaxis and Rash  . Penicillins Other (See Comments)    Childhood Has patient had a PCN reaction causing immediate rash, facial/tongue/throat swelling, SOB or lightheadedness with hypotension: uknown Has patient had a PCN reaction causing severe rash involving mucus membranes or skin necrosis: unknown Has patient had a PCN reaction that required hospitalization unknown Has patient had a PCN reaction occurring within the last 10 years: uknown If all of the above answers are "NO", then may proceed with Cephalosporin use.   . Sulfa Antibiotics Other (See Comments)    Unknown. Childhood Allergy     Review of Systems: All systems reviewed and negative except where noted  in HPI.   Lab Results  Component Value Date   ALT 81 (H) 07/02/2018   AST 41 (H) 07/02/2018   ALKPHOS 50 04/08/2017   BILITOT 2.0 (H) 07/02/2018    Lab Results  Component Value Date   WBC 11.1 (H) 05/25/2018   HGB 16.3 05/25/2018   HCT 46.6 05/25/2018   MCV 92.0 05/25/2018   PLT 198.0 05/25/2018    Lab Results  Component Value Date   CREATININE 0.85 07/02/2018   BUN 21 07/02/2018   NA 140 07/02/2018   K 4.4 07/02/2018   CL 105 07/02/2018   CO2 25 07/02/2018     Physical Exam: There were no vitals taken for this visit. NA   ASSESSMENT AND PLAN: 38 y/o male here for reassessment of the following issues:  H/o intra-abdominal abscess / history of SBO - history as outlined above, multiple hospitalizations in 2018, s/p surgical resection with path c/w foreign body reaction. Follow up  colonoscopy and capsule endoscopy has not shown any evidence of Crohn's disease, which was the main concern at that time. There was a question per radiology at one point at whether or not he had complicated diverticular disease. Over time he has not had any further recurrence and generally doing well, making Crohn's unlikely at this point. He does have some food intolerances, particularly to fresh vegetables, which he has been avoiding, and using Bentyl PRN which works great for his periodic cramps. We will continue to monitor for now. If he has recurrence of any significant symptomatic episodes I asked him to contact me, would have low threshold for reimaging him with an enterography study, but otherwise if feeling well will continue to monitor, and I will see him in a year for follow up. Refilled Bentyl to use PRN.  Elevated liver enzymes - ALT newly elevated as of December, he's also had elevated bilirubin levels in the past as well. Will repeat now that it has been a few months and fractionate bilirubin. If LAEs persistently remain elevated will need further workup. He agreed.   Ileene PatrickSteven Armbruster, MD [facility]

## 2018-12-10 ENCOUNTER — Other Ambulatory Visit (INDEPENDENT_AMBULATORY_CARE_PROVIDER_SITE_OTHER): Payer: BLUE CROSS/BLUE SHIELD

## 2018-12-10 DIAGNOSIS — Z8719 Personal history of other diseases of the digestive system: Secondary | ICD-10-CM | POA: Diagnosis not present

## 2018-12-10 DIAGNOSIS — R748 Abnormal levels of other serum enzymes: Secondary | ICD-10-CM

## 2018-12-10 DIAGNOSIS — Z87898 Personal history of other specified conditions: Secondary | ICD-10-CM

## 2018-12-10 LAB — HEPATIC FUNCTION PANEL
ALT: 87 U/L — ABNORMAL HIGH (ref 0–53)
AST: 39 U/L — ABNORMAL HIGH (ref 0–37)
Albumin: 4.7 g/dL (ref 3.5–5.2)
Alkaline Phosphatase: 68 U/L (ref 39–117)
Bilirubin, Direct: 0.3 mg/dL (ref 0.0–0.3)
Total Bilirubin: 2 mg/dL — ABNORMAL HIGH (ref 0.2–1.2)
Total Protein: 7.1 g/dL (ref 6.0–8.3)

## 2018-12-14 ENCOUNTER — Other Ambulatory Visit: Payer: Self-pay

## 2018-12-14 DIAGNOSIS — R748 Abnormal levels of other serum enzymes: Secondary | ICD-10-CM

## 2018-12-21 ENCOUNTER — Other Ambulatory Visit: Payer: BLUE CROSS/BLUE SHIELD

## 2018-12-21 DIAGNOSIS — R748 Abnormal levels of other serum enzymes: Secondary | ICD-10-CM

## 2018-12-23 LAB — IRON,TIBC AND FERRITIN PANEL
%SAT: 27 % (calc) (ref 20–48)
Ferritin: 301 ng/mL (ref 38–380)
Iron: 95 ug/dL (ref 50–180)
TIBC: 348 mcg/dL (calc) (ref 250–425)

## 2018-12-23 LAB — HEPATITIS A ANTIBODY, TOTAL: Hepatitis A AB,Total: NONREACTIVE

## 2018-12-23 LAB — ALPHA-1-ANTITRYPSIN: A-1 Antitrypsin, Ser: 161 mg/dL (ref 83–199)

## 2018-12-23 LAB — ANA: Anti Nuclear Antibody (ANA): NEGATIVE

## 2018-12-23 LAB — CERULOPLASMIN: Ceruloplasmin: 23 mg/dL (ref 18–36)

## 2018-12-23 LAB — ANTI-SMOOTH MUSCLE ANTIBODY, IGG: Actin (Smooth Muscle) Antibody (IGG): <20 U (ref <20)

## 2018-12-23 LAB — HEPATITIS B SURFACE ANTIBODY, QUANTITATIVE: Hepatitis B-Post: 273 m[IU]/mL (ref >=10)

## 2018-12-23 LAB — HEPATITIS C ANTIBODY
Hepatitis C Ab: NONREACTIVE
SIGNAL TO CUT-OFF: 0.02 (ref <1.00)

## 2018-12-23 LAB — HEPATITIS B SURFACE ANTIGEN: Hepatitis B Surface Ag: NONREACTIVE

## 2019-02-02 DIAGNOSIS — J301 Allergic rhinitis due to pollen: Secondary | ICD-10-CM

## 2019-02-02 NOTE — Progress Notes (Unsigned)
VIALS EXP 02-02-2020 

## 2019-02-14 ENCOUNTER — Ambulatory Visit (INDEPENDENT_AMBULATORY_CARE_PROVIDER_SITE_OTHER): Payer: BC Managed Care – PPO

## 2019-02-14 ENCOUNTER — Other Ambulatory Visit: Payer: Self-pay

## 2019-02-14 ENCOUNTER — Telehealth: Payer: Self-pay

## 2019-02-14 DIAGNOSIS — J309 Allergic rhinitis, unspecified: Secondary | ICD-10-CM | POA: Diagnosis not present

## 2019-02-14 NOTE — Telephone Encounter (Signed)
Please call patient and let him know that okay to stop the steroid nasal sprays.  Thank you.

## 2019-02-14 NOTE — Telephone Encounter (Signed)
Patient came into the clinic today to get his injections and informed me that per his doctor he recommends patient to stop all steroid nasal sprays due to posterior subcapsular cataracts which is mildly affecting his vision currently. Patient brought a note from his doctor which will get scanned into his chart.

## 2019-02-14 NOTE — Progress Notes (Signed)
Immunotherapy   Patient Details  Name: [name] MRN: [id number] Date of Birth: [birth year]  02/14/2019   Eustaquio Maize came in to get his first injection out of a new red vial and take them out. Patient received 0.32ml of both his red vials. One with MOLD-CR and the other with G-DM. Patient waited 30 minutes with no problems. Following schedule: C  Frequency: Weekly, Every 2 Weeks at 0.50 Epi-Pen:Epi-Pen Available  Pt took vials home to be administered by mother-in-law which is an Therapist, sports. Patient forgot his previous records and will email them to the same person he did last time. I informed patient to call and let us know what email address he sends them to, so we can be sure to get them in his chart.  Herbie Drape 02/14/2019, 2:35 PM

## 2019-02-14 NOTE — Telephone Encounter (Signed)
Call to patient to notify him that it was okay to stop the nasal sprays.  Pt verbalized understanding, no further questions.

## 2019-02-15 NOTE — Telephone Encounter (Signed)
That's reasonable. We could restart at least the antihistamine if needed. The nasal steroids affected ocular pressures more than the antihistamines.   Salvatore Marvel, MD [facility] [place]

## 2019-04-21 ENCOUNTER — Other Ambulatory Visit: Payer: Self-pay

## 2019-04-21 ENCOUNTER — Telehealth: Payer: Self-pay

## 2019-04-21 DIAGNOSIS — R748 Abnormal levels of other serum enzymes: Secondary | ICD-10-CM

## 2019-04-21 NOTE — Telephone Encounter (Signed)
-----   Message from Hughie Closs, RN sent at 12/27/2018  1:42 PM EDT ----- Call pt. And have come in for 4  month F/U LFTs=04/28/19. Order in [place]

## 2019-04-21 NOTE — Telephone Encounter (Signed)
Called patient to have him come in for 4 month F/U  LFTs. And scheduled for Hep A vaccines

## 2019-04-26 ENCOUNTER — Other Ambulatory Visit (INDEPENDENT_AMBULATORY_CARE_PROVIDER_SITE_OTHER): Payer: BC Managed Care – PPO

## 2019-04-26 DIAGNOSIS — R748 Abnormal levels of other serum enzymes: Secondary | ICD-10-CM

## 2019-04-26 LAB — HEPATIC FUNCTION PANEL
ALT: 102 U/L — ABNORMAL HIGH (ref 0–53)
AST: 41 U/L — ABNORMAL HIGH (ref 0–37)
Albumin: 4.6 g/dL (ref 3.5–5.2)
Alkaline Phosphatase: 74 U/L (ref 39–117)
Bilirubin, Direct: 0.2 mg/dL (ref 0.0–0.3)
Total Bilirubin: 1.4 mg/dL — ABNORMAL HIGH (ref 0.2–1.2)
Total Protein: 7 g/dL (ref 6.0–8.3)

## 2019-04-27 ENCOUNTER — Other Ambulatory Visit: Payer: Self-pay

## 2019-04-27 ENCOUNTER — Ambulatory Visit (INDEPENDENT_AMBULATORY_CARE_PROVIDER_SITE_OTHER): Payer: BC Managed Care – PPO | Admitting: Gastroenterology

## 2019-04-27 DIAGNOSIS — R748 Abnormal levels of other serum enzymes: Secondary | ICD-10-CM

## 2019-04-28 DIAGNOSIS — Z23 Encounter for immunization: Secondary | ICD-10-CM

## 2019-04-29 ENCOUNTER — Telehealth: Payer: Self-pay | Admitting: Gastroenterology

## 2019-04-29 NOTE — Telephone Encounter (Signed)
Called patient back and gave him Dr. Doyne Keel comments. He is feeling a little better. He'll try some tylenol. Ask him to contact his PCP if he gets a fever or feels worse

## 2019-04-29 NOTE — Telephone Encounter (Signed)
I guess it's possible could be a reaction but not too common that I have seen. If it is a reaction should get better with time. He should take some tylenol as needed and see how things go. If febrile or worsening should touch base with his PCP, not sure if he would need covid testing. Thanks

## 2019-04-29 NOTE — Progress Notes (Signed)
error 

## 2019-05-12 IMAGING — DX DG ABDOMEN 1V
1 series · 1 of 1 positions shown · non-contrast
Comparison: 04/12/2017, 04/10/2017

CLINICAL DATA: History of bowel perforation and resection in Sunday September, 2016

EXAM:
ABDOMEN - 1 VIEW

[abdomen kub]
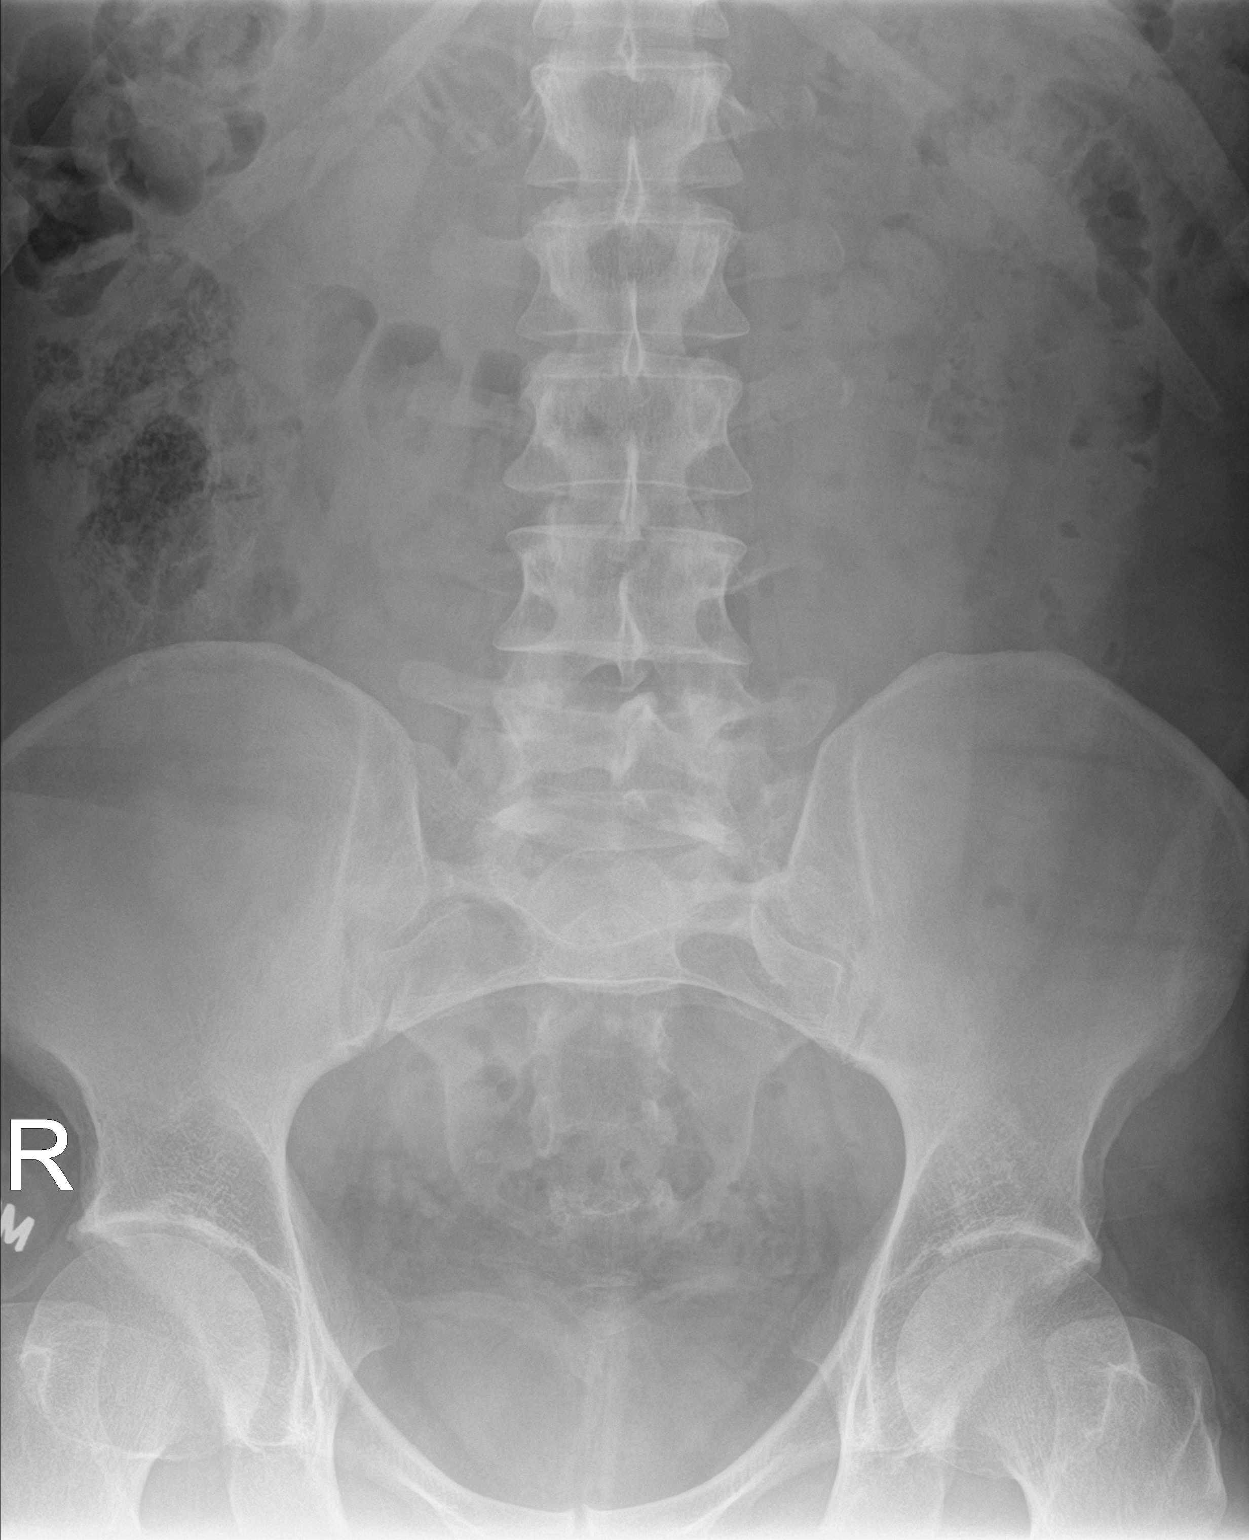

[1 of 1 positions shown; findings below may reference images not displayed]

FINDINGS: Nonobstructive bowel gas pattern with stool in the right colon. No
abnormal calcifications.
IMPRESSION: Nonobstructed bowel-gas pattern.

## 2019-07-05 NOTE — Progress Notes (Signed)
Vials exp 07-04-20 

## 2019-07-06 DIAGNOSIS — J301 Allergic rhinitis due to pollen: Secondary | ICD-10-CM

## 2019-07-20 ENCOUNTER — Ambulatory Visit (INDEPENDENT_AMBULATORY_CARE_PROVIDER_SITE_OTHER): Payer: BC Managed Care – PPO | Admitting: *Deleted

## 2019-07-20 ENCOUNTER — Other Ambulatory Visit: Payer: Self-pay

## 2019-07-20 DIAGNOSIS — J309 Allergic rhinitis, unspecified: Secondary | ICD-10-CM | POA: Diagnosis not present

## 2019-07-20 NOTE — Progress Notes (Signed)
Immunotherapy   Patient Details  Name: [name] MRN: [id number] Date of Birth: Oct 30, 1980  07/20/2019  [name] in to get his first injection out of a new red vial and take them out. Patient received 0.61ml of both his red vials. One withMOLD-CR and the other with G-DM. Patient left without waiting Following schedule:C Frequency:Weekly, Every 2 Weeks at 0.50 Epi-Pen:Epi-Pen Available Pt took vials home to be administered by mother-in-law which is an Therapist, sports.  [name] 07/20/2019, 9:40 AM

## 2019-07-25 ENCOUNTER — Other Ambulatory Visit: Payer: Self-pay

## 2019-07-25 DIAGNOSIS — R748 Abnormal levels of other serum enzymes: Secondary | ICD-10-CM

## 2019-07-26 ENCOUNTER — Telehealth: Payer: Self-pay

## 2019-07-26 NOTE — Telephone Encounter (Signed)
-----   Message from Antony Blackbird, RN sent at 04/28/2019 12:35 PM EDT ----- Put in Epic and call patient for 3 month F/U LFTs = 07/29/19. Also try to schedule an ov w/Dr.A if he hasn't already had one recently

## 2019-07-26 NOTE — Telephone Encounter (Signed)
Called patient and asked him to come into our lab for a 3 month repeat LFTs. Patient agreed to come in this week. He will call back to schedule an office visit with Dr. Adela Lank, said he needs to check his schedule

## 2019-08-01 ENCOUNTER — Other Ambulatory Visit (INDEPENDENT_AMBULATORY_CARE_PROVIDER_SITE_OTHER): Payer: BC Managed Care – PPO

## 2019-08-01 DIAGNOSIS — R748 Abnormal levels of other serum enzymes: Secondary | ICD-10-CM | POA: Diagnosis not present

## 2019-08-01 LAB — HEPATIC FUNCTION PANEL
ALT: 67 U/L — ABNORMAL HIGH (ref 0–53)
AST: 35 U/L (ref 0–37)
Albumin: 4.6 g/dL (ref 3.5–5.2)
Alkaline Phosphatase: 66 U/L (ref 39–117)
Bilirubin, Direct: 0.2 mg/dL (ref 0.0–0.3)
Total Bilirubin: 1.2 mg/dL (ref 0.2–1.2)
Total Protein: 7.1 g/dL (ref 6.0–8.3)

## 2019-08-09 ENCOUNTER — Other Ambulatory Visit: Payer: Self-pay

## 2019-08-09 ENCOUNTER — Ambulatory Visit (INDEPENDENT_AMBULATORY_CARE_PROVIDER_SITE_OTHER): Payer: BC Managed Care – PPO | Admitting: Allergy & Immunology

## 2019-08-09 ENCOUNTER — Encounter: Payer: Self-pay | Admitting: Allergy & Immunology

## 2019-08-09 VITALS — BP 142/88 | HR 91 | Temp 98.0°F | Resp 18 | Ht 74.0 in | Wt 248.4 lb

## 2019-08-09 DIAGNOSIS — J302 Other seasonal allergic rhinitis: Secondary | ICD-10-CM

## 2019-08-09 DIAGNOSIS — J3089 Other allergic rhinitis: Secondary | ICD-10-CM

## 2019-08-09 MED ORDER — EPINEPHRINE 0.3 MG/0.3ML IJ SOAJ: 0.3000 mg | Freq: Once | INTRAMUSCULAR | 1 refills | Status: AC

## 2019-08-09 NOTE — Progress Notes (Signed)
FOLLOW UP  Date of Service/Encounter:  08/09/19   Assessment:   Seasonal and perennial allergic rhinitis (grasses, molds, dust mite, cockroach) - on allergen immunotherapy with maintenance reached in August 2019  History of elevated liver enzymes - improved  History of cataracts - improved since stopping the fluticasone  History of bowel obstruction   Plan/Recommendations:   1. Seasonal and perennial allergic rhinitis - Continue with allergy shots at the same schedule.  - Continue with Astelin as needed.  - Continue with alternating antihistamines as needed. [name] updated today.   2. Return in about 1 year (around 08/08/2020). This can be an in-person, a virtual Webex or a telephone follow up visit.  Subjective:   [name] is a 39 y.o. male presenting today for follow up of  Chief Complaint  Patient presents with  . Follow-up    [name] has a history of the following: Patient Active Problem List   Diagnosis Date Noted  . Dizziness 08/05/2018  . Seasonal and perennial allergic rhinitis 06/04/2017  . Adverse food reaction 06/04/2017  . History of small bowel obstruction 06/04/2017  . Abnormal CT of the abdomen   . Abdominal pain   . Sepsis (HCC) 04/06/2017  . Colitis 04/06/2017  . Pelvic abscess in male [facility]) 10/12/2016  . SBO (small bowel obstruction) (HCC) 08/21/2016  . Small bowel obstruction (HCC) 08/20/2016    History obtained from: chart review and patient.  [name] is a 39 y.o. male presenting for a follow up visit. He was last seen in September 2019 by myself. At that time, we continued with allergy shots at the same schedule. We also continued with Dymista and cetirizine. We stopped the montelukast since he was not taking it on a routine basis anyway.  Since last visit, he has done well.  Allergy shots are going well without any issues.  He did have a few large local reactions, but they were nothing that interrupted his advance.  He  went off of the Flonase because he was developing " mild cataracts".  He is now only on Astelin on a as needed basis.  He does use an antihistamine, which he alternates every 2 to 3 months.  He sees an ophthalmologist regularly.  [name] is on allergen immunotherapy. He receives two injections. Immunotherapy script #1 contains molds and cockroach. He currently receives 0.69mL of the RED vial (1/100). Immunotherapy script #2 contains grasses and dust mites. He currently receives 0.23mL of the RED vial (1/100). He started shots December of 2018 and reached maintenance in August of 2019. He mostly has tolerated them without large local reactions.  He does report some sinus pain which has been ongoing since this morning.  He is going to push through it and hopefully will be better by the end of the week.  He will call us if there are any issues.  He has not needed antibiotics in several months, likely close to when we first started seeing him.  Allergy shots have decreased his incidence of sinusitis.  He has been having some episodes of elevated liver enzymes.  He does not drink at all, but determined this was because he was not drinking enough fluids.  He increase his fluid intake and started a vegetable supplement with improvement in his liver function testing.  He was having issues with whole vegetables because of all the fiber in his history of bowel obstruction and subsequent removal.  However, with the vegetable supplements, he gets the benefits of  the vitamins and minerals without the drawbacks of the fiber.  Otherwise, there have been no changes to his past medical history, surgical history, family history, or social history.    Review of Systems  Constitutional: Negative.  Negative for chills, fever, malaise/fatigue and weight loss.  HENT: Negative.  Negative for congestion, ear discharge and ear pain.   Eyes: Negative for pain, discharge and redness.  Respiratory: Negative for cough, sputum  production, shortness of breath and wheezing.   Cardiovascular: Negative.  Negative for chest pain and palpitations.  Gastrointestinal: Negative for abdominal pain, heartburn, nausea and vomiting.  Skin: Negative.  Negative for itching and rash.  Neurological: Positive for headaches. Negative for dizziness.  Endo/Heme/Allergies: Negative for environmental allergies. Does not bruise/bleed easily.       Objective:   Blood pressure (!) 142/88, pulse 91, temperature 98 F (36.7 C), temperature source Temporal, resp. rate 18, height 6\' 2"  (1.88 m), weight 248 lb 6.4 oz (112.7 kg), SpO2 97 %. Body mass index is 31.89 kg/m.   Physical Exam:  Physical Exam  Constitutional: He appears well-developed.  Pleasant talkative male.  HENT:  Head: Normocephalic and atraumatic.  Right Ear: Tympanic membrane, external ear and ear canal normal.  Left Ear: Tympanic membrane, external ear and ear canal normal.  Nose: Mucosal edema present. No rhinorrhea, nasal deformity or septal deviation. No epistaxis. Right sinus exhibits no maxillary sinus tenderness and no frontal sinus tenderness. Left sinus exhibits no maxillary sinus tenderness and no frontal sinus tenderness.  Mouth/Throat: Uvula is midline and oropharynx is clear and moist. Mucous membranes are not pale and not dry.  Eyes: Pupils are equal, round, and reactive to light. Conjunctivae and EOM are normal. Right eye exhibits no chemosis and no discharge. Left eye exhibits no chemosis and no discharge. Right conjunctiva is not injected. Left conjunctiva is not injected.  Cardiovascular: Normal rate, regular rhythm and normal heart sounds.  Respiratory: Effort normal and breath sounds normal. No accessory muscle usage. No tachypnea. No respiratory distress. He has no wheezes. He has no rhonchi. He has no rales. He exhibits no tenderness.  Moving air well in all lung fields.  Lymphadenopathy:    He has no cervical adenopathy.  Neurological: He is  alert.  Skin: No abrasion, no petechiae and no rash noted. Rash is not papular, not vesicular and not urticarial. No erythema. No pallor.  Psychiatric: He has a normal mood and affect.     Diagnostic studies: none       Salvatore Marvel, MD  [facility] [place]

## 2019-08-09 NOTE — Patient Instructions (Addendum)
1. Seasonal and perennial allergic rhinitis - Continue with allergy shots at the same schedule.  - Continue with Astelin as needed.  - Continue with alternating antihistamines as needed. [name] updated today.   2. Return in about 1 year (around 08/08/2020). This can be an in-person, a virtual Webex or a telephone follow up visit.   Please inform us of any Emergency Department visits, hospitalizations, or changes in symptoms. Call us before going to the ED for breathing or allergy symptoms since we might be able to fit you in for a sick visit. Feel free to contact us anytime with any questions, problems, or concerns.  It was a pleasure to see you again today!  Websites that have reliable patient information: 1. American Academy of Asthma, Allergy, and Immunology: www.aaaai.org 2. Food Allergy Research and Education (FARE): foodallergy.org 3. Mothers of Asthmatics: http://www.asthmacommunitynetwork.org 4. American College of Allergy, Asthma, and Immunology: www.acaai.org   COVID-19 Vaccine Information can be found at: [email or web address] For questions related to vaccine distribution or appointments, please email [email or web address] or call [phone].     "Like" [place] on Facebook and Instagram for our latest updates!        Make sure you are registered to vote! If you have moved or changed any of your contact information, you will need to get this updated before voting!  In some cases, you MAY be able to register to vote online: [email or web address]

## 2019-08-31 ENCOUNTER — Ambulatory Visit (INDEPENDENT_AMBULATORY_CARE_PROVIDER_SITE_OTHER): Payer: BC Managed Care – PPO | Admitting: Gastroenterology

## 2019-08-31 ENCOUNTER — Encounter: Payer: Self-pay | Admitting: Gastroenterology

## 2019-08-31 VITALS — BP 140/80 | HR 92 | Temp 98.6°F | Ht 74.0 in | Wt 251.4 lb

## 2019-08-31 DIAGNOSIS — Z87898 Personal history of other specified conditions: Secondary | ICD-10-CM | POA: Diagnosis not present

## 2019-08-31 DIAGNOSIS — K760 Fatty (change of) liver, not elsewhere classified: Secondary | ICD-10-CM | POA: Diagnosis not present

## 2019-08-31 DIAGNOSIS — Z8719 Personal history of other diseases of the digestive system: Secondary | ICD-10-CM | POA: Diagnosis not present

## 2019-08-31 NOTE — Patient Instructions (Addendum)
If you are age 39 or older, your body mass index should be between 23-30. Your Body mass index is 32.27 kg/m. If this is out of the aforementioned range listed, please consider follow up with your Primary Care Provider.  If you are age 37 or younger, your body mass index should be between 19-25. Your Body mass index is 32.27 kg/m. If this is out of the aformentioned range listed, please consider follow up with your Primary Care Provider.   You will be due for LFTs lab work in July.  Please go to the lab in the basement of our building. They are open Monday through Friday 8:00am - 5:00pm. No appointment is necessary.  Continue working on weight loss.  Your next Hepatitis A vaccine is scheduled for Friday, 10-28-19 at 10:30am.   Thank you for entrusting me with your care and for choosing [facility], Dr. Ileene Patrick

## 2019-08-31 NOTE — Progress Notes (Signed)
HPI :  39 year old male here for a follow-up visit for history of intra-abdominal abscess / small bowel obstructions, and fatty liver.  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 He was treated with percutaneous drainage initially and IV antibiotics. All drains were removed by March 2018 at which timethe fistula had apparently resolved.Readmitted 10/12/2016 with recurrent intra-abdominal abscesses and bowel and mesenteric inflammation. He ultimately required a diagnostic laparotomy and drainage of abdominal abscess and ileocecectomy per Dr. Rowan Blase. Path from the small bowel specimen was benign with serosal adhesions and serositis and areas of foreign body giant cell reaction presumably from prior perforation, he had a benign appendix, no evidence for IBD, dysplasia or malignancy.   Readmitted in September 2018 with abdominal pain nausea and vomiting and was diagnosed with a small bowel obstruction. CT scan on 04/06/2017 showed moderate inflammatory changes within the abdomen and upper pelvis, poorly defined inflammatory process in the right lower quadrant/upper pelvis centered around nondilated loops of small bowel could not rule out IBD was a dilated segment of small bowel in the upper pelvis and mild diffuse generalized inflammation within the peritoneal cavity and central mesentery sigmoid colon diverticular disease. He was initially treated with antibiotics, and NG decompression. He had repeat CT scan on 04/10/2017 which raised question of possible sigmoid diverticulitis with suspected fluid collection/abscess 11 cm in diameter along with thickening of the mid to distal small bowel. Images were reviewed with IR or potential drainage and it was not felt that the fluid represented an abscess.,and did not feel it would be  accessible via IR approach.Patient improved quickly, tolerated a bowel prep and underwent colonoscopy on 04/15/2017. This was a normal exam with end-to-end ileocolonic anastomosis in the ascending colon, multiple left colon diverticuli no evidence of diverticulitis and he had a normal terminal ileum. TI was unable to be deeply intubated due to angulation. Biopsies were taken and these returned showing normal mucosa".  Capsule endoscopy in November 2019 - the exam showed a normal small bowel transit and essentially normal mucosa - there was no evidence of any inflammatory changes or Crohn's disease.    Since the patient has recovered from this hospitalization he has been doing quite well.  He has not had any recurrence of abdominal pains or abscess that he is aware of.  He does have some ongoing intolerance to certain trigger foods which can lead to loose stools and bloating.  Specifically vegetables have caused him problems, he has been using powdered vegetables and fruits which minimizes symptoms and he tolerates it pretty well.  He has no abdominal pains that have bothered him otherwise that are similar to his previous complaints.  He moves his bowels pretty regularly at baseline.  No blood in his stools.  He did have an uptrend of his ALT to low 100s since have seen him.  He has had an extensive serologic work-up which has been negative.  He has had fatty liver noted on prior imaging studies.  His weight peaked at 256, we had discussed options and I recommended weight loss.  He states he is lost about 13 to 15 pounds over the past 3 to 4 months with diet and exercise.  He still is quite motivated to lose more weight.  At his home scale he weighs 244, noted to be 251 wearing clothes and shoes today  in the office.  He otherwise had a hepatitis A vaccine, states he felt pretty poorly after having the first dose which lasted about 24 hours.  He is not drinking any alcohol.  After losing weight we repeated  his LFTs last month and his ALT has since dropped to 60s.  AST normal.     39 y/o male here for reassessment of the following issues:  H/o intra-abdominal abscess / history of SBO - history as outlined above, multiple hospitalizations in 2018, s/p surgical resection with path c/w foreign body reaction. Follow up colonoscopy and capsule endoscopy has not shown any evidence of Crohn's disease, which was the main concern at that time. There was a question per radiology at one point at whether or not he had complicated diverticular disease. Over time he has not had any further recurrence and generally doing well, making Crohn's unlikely at this point. He does have some food intolerances, particularly to fresh vegetables, which he has been avoiding, and using Bentyl PRN which works great for his periodic cramps. We will continue to monitor for now. If he has recurrence of any significant symptomatic episodes I asked him to contact me, would have low threshold for reimaging him with an enterography study, but otherwise if feeling well will continue to monitor, and I will see him in a year for follow up. Refilled Bentyl to use PRN.  Elevated liver enzymes - ALT newly elevated as of December, he's also had elevated bilirubin levels in the past as well. Will repeat now that it has been a few months and fractionate bilirubin. If LAEs persistently remain elevated will need further workup. He agreed.    Workup for elevation in LFTs negative Suspected to be due to fatty liver, vaccinated to hep A  LFTs 1 month ago better     Past Medical History:  Diagnosis Date  . Legionella pneumonia (HCC)    2009     Past Surgical History:  Procedure Laterality Date  . COLONOSCOPY WITH PROPOFOL N/A 04/15/2017   Procedure: COLONOSCOPY WITH PROPOFOL;  Surgeon: Benancio Deeds, MD;  Location: [facility] END[facility]COPY;  Service: Gastroenterology;  Laterality: N/A;  . HERNIA REPAIR    . IR GENERIC HISTORICAL  09/09/2016    IR RADIOLOGIST EVAL & MGMT 09/09/2016 Berdine Dance, MD [facility] INTERV RAD  . IR RADIOLOGIST EVAL & MGMT  09/23/2016  . KNEE SURGERY Left   . LAPAROSCOPIC SMALL BOWEL RESECTION N/A 10/16/2016   Procedure: LAPAROSCOPIC SMALL BOWEL RESECTION POSSIBLE EXPLORATORY LAPAROTOMY AND POSSIBLE OPEN WASH OUT ileocectomy;  Surgeon: Almond Lint, MD;  Location: [facility] ORS;  Service: General;  Laterality: N/A;   Family History  Problem Relation Age of Onset  . Diabetes Father   . Cancer Paternal Grandfather        oral cancer  . Mental illness Mother   . Allergic rhinitis Mother   . Angioedema Neg Hx   . Asthma Neg Hx   . Eczema Neg Hx   . Colon cancer Neg Hx   . Esophageal cancer Neg Hx   . Stomach cancer Neg Hx    Social History   Tobacco Use  . Smoking status: Never Smoker  . Smokeless tobacco: Never Used  Substance Use Topics  . Alcohol use: No    Alcohol/week: 0.0 standard drinks  . Drug use: No   Current Outpatient Medications  Medication Sig Dispense Refill  . AMBULATORY NON FORMULARY MEDICATION Allergy injections Every other week    . azelastine (ASTELIN) 0.1 %  nasal spray PLACE 1 SPRAY INTO BOTH NOSTRILS DAILY AS NEEDED FOR RHINITIS. 30 mL 5  . dicyclomine (BENTYL) 10 MG capsule Take 1 capsule (10 mg total) by mouth every 8 (eight) hours as needed for spasms. 180 capsule 3  . FIBER PO Take 2-5 capsules by mouth per meal    . loratadine (CLARITIN) 10 MG tablet Take 10 mg by mouth daily.    . meclizine (ANTIVERT) 25 MG tablet TAKE 1 TABLET BY MOUTH 3 TIMES DAILY AS NEEDED FOR UP TO 10 DAYS FOR DIZZINESS.     No current facility-administered medications for this visit.   Allergies  Allergen Reactions  . Keflex [Cephalexin] Anaphylaxis and Rash  . Penicillins Other (See Comments)    Childhood Has patient had a PCN reaction causing immediate rash, facial/tongue/throat swelling, SOB or lightheadedness with hypotension: uknown Has patient had a PCN reaction causing severe rash involving  mucus membranes or skin necrosis: unknown Has patient had a PCN reaction that required hospitalization unknown Has patient had a PCN reaction occurring within the last 10 years: uknown If all of the above answers are "NO", then may proceed with Cephalosporin use.   . Sulfa Antibiotics Other (See Comments)    Unknown. Childhood Allergy     Review of Systems: All systems reviewed and negative except where noted in HPI.    No results found.  Physical Exam: BP 140/80   Pulse 92   Temp 98.6 F (37 C)   Ht 6\' 2"  (1.88 m)   Wt 251 lb 6 oz (114 kg)   BMI 32.27 kg/m  Constitutional: Pleasant,well-developed, male in no acute distress. HEENT: Normocephalic and atraumatic. Conjunctivae are normal. No scleral icterus. Neck supple.  Cardiovascular: Normal rate, regular rhythm.  Pulmonary/chest: Effort normal and breath sounds normal. No wheezing, rales or rhonchi. Abdominal: Soft, nondistended, nontender. There are no masses palpable. No hepatomegaly. Extremities: no edema Lymphadenopathy: No cervical adenopathy noted. Neurological: Alert and oriented to person place and time. Skin: Skin is warm and dry. No rashes noted. Psychiatric: Normal mood and affect. Behavior is normal.   ASSESSMENT AND PLAN: 39 y/o male here for reassessment of the following issues:  Fatty liver - this is the most likely cause for his ALT elevation.  He has been doing a really good job of diet and exercise to lose weight and his ALT has down trended nicely in recent months.  We discussed fatty liver in general, long-term risks of cirrhosis.  He will continue to work on weight loss and seems highly motivated to do so.  We will plan on repeating his liver enzymes in another 6 months to reassess.  Otherwise encourage routine coffee drinking, he is already doing that.  He is due for another hep A vaccine in a few months, he will think about if he wishes to have another dose given he had some malaise after the first  dose.  We discussed hep A in general, he will think about this.  H/o intra-abdominal abscess / history of SBO - history as outlined above, multiple hospitalizations in 2018, s/p surgical resection with path c/w foreign body reaction. Follow up colonoscopy and capsule endoscopy has not shown any evidence of Crohn's disease, which was the main concern at that time. There was a question per radiology at one point at whether or not he had complicated diverticular disease.  Since his last hospitalization has been doing really well without any recurrence of pain, however he does have some food intolerances, question  whether he has developed some irritable bowel syndrome after undergoing all this.  We have discussed the role of reimaging his abdomen to reassess things given the abnormalities on his prior exams.  He has had about 9 CT scans in 2018 alone, he is still paying medical bills from that hospitalization, given he feels well, he wishes to hold off on further imaging right now, but have a low threshold if he has any recurrence of symptoms he will contact me.  I think it is unlikely he has Crohn's disease at this point given work-up to date and stability since his last hospitalization.  He agreed  I spent 35 minutes of time, including in depth chart review, independent review of results as outlined above, communicating results with the patient directly, face-to-face time with the patient, coordinating care, and ordering studies and medications as appropriate, and documentation.  Ileene Patrick, MD [facility]

## 2019-11-04 ENCOUNTER — Ambulatory Visit (INDEPENDENT_AMBULATORY_CARE_PROVIDER_SITE_OTHER): Payer: BC Managed Care – PPO | Admitting: Gastroenterology

## 2019-11-04 VITALS — Temp 97.9°F

## 2019-11-04 DIAGNOSIS — Z23 Encounter for immunization: Secondary | ICD-10-CM

## 2019-11-07 NOTE — Progress Notes (Signed)
2nd Hep A immunization  given to patient in left Deltoid. Patient tolerated injection well and waited for 15 minutes post injection.

## 2019-12-05 ENCOUNTER — Telehealth: Payer: Self-pay | Admitting: *Deleted

## 2019-12-05 DIAGNOSIS — J3089 Other allergic rhinitis: Secondary | ICD-10-CM

## 2019-12-05 DIAGNOSIS — J302 Other seasonal allergic rhinitis: Secondary | ICD-10-CM

## 2019-12-05 NOTE — Telephone Encounter (Signed)
Patient called to have his next set of Red vials ordered and mentioned that you were wanting to increase the strength of his vials? Please advise? Would you be starting blue vials?

## 2019-12-06 NOTE — Telephone Encounter (Signed)
Called and left a voicemail asking for patient to call back to discuss and get him scheduled to come in and receive first injection and pick up vial(s).

## 2019-12-06 NOTE — Telephone Encounter (Signed)
New IT script written for the dust mites and grass allergy shot vial. Instructions on dosing within the script.   Ruben Bonds, MD [facility]

## 2019-12-06 NOTE — Addendum Note (Signed)
Addended by: Alfonse Spruce on: 12/06/2019 01:29 PM   Modules accepted: Orders

## 2019-12-07 NOTE — Telephone Encounter (Signed)
Patient called back and has been scheduled to pick up vials. Is it just the one vial prescription that is changing? Is the other vial still the same?

## 2019-12-08 NOTE — Telephone Encounter (Signed)
Correction the Mold and Cockroach vial stays the same. Not Mouse and Cockroach.

## 2019-12-08 NOTE — Progress Notes (Signed)
Vials exp 12-11-20

## 2019-12-08 NOTE — Telephone Encounter (Signed)
Yes we are just increasing the dosing on one vial.  The one containing mouse and cockroach will stay the same.  Malachi Bonds, MD [facility]

## 2019-12-08 NOTE — Telephone Encounter (Signed)
Patient has been scheduled to pick up Red vials on 01/02/20 at 3:30. The prescription for his pollen vial has changed. The prescription for his Mouse and Cockroach vial is staying the same.

## 2019-12-13 DIAGNOSIS — J3089 Other allergic rhinitis: Secondary | ICD-10-CM

## 2020-01-02 ENCOUNTER — Other Ambulatory Visit: Payer: Self-pay

## 2020-01-02 ENCOUNTER — Ambulatory Visit (INDEPENDENT_AMBULATORY_CARE_PROVIDER_SITE_OTHER): Payer: BC Managed Care – PPO

## 2020-01-02 DIAGNOSIS — J309 Allergic rhinitis, unspecified: Secondary | ICD-10-CM

## 2020-01-02 NOTE — Progress Notes (Signed)
Immunotherapy   Patient Details  Name: [name] MRN: [id number] Date of Birth: 1980/09/02  01/02/2020  [name] came intoget his first injection out of a new red vial and take them out. Patient received 0.51ml of both his red vials with an expiration of 12/11/2020. One withMOLD-CR andthe other withG-DM. Patient waited 30 minutes with no problem. Following schedule: C  Frequency: Weekly then @ 0.60ml every 3 weeks. 12/2020 patient will go to every 4 weeks Epi-Pen: Yes Consent signed and patient instructions given. Patient will continue to receive his injections at home by his mother-in-law who is an [employer] fundraiser.   [name] 01/02/2020, 3:31 PM

## 2020-01-19 ENCOUNTER — Telehealth: Payer: Self-pay

## 2020-01-19 NOTE — Telephone Encounter (Signed)
Called pt and reminded him to go to the lab. He wanted Dr. Mervyn Skeeters to know that unfortunately he has gained back the weight he has lost.

## 2020-01-19 NOTE — Telephone Encounter (Signed)
-----   Message from Cooper Render, CMA sent at 08/31/2019  2:58 PM EST ----- Regarding: LFTS due in July Remind pt to go to the lab for LFTs in July. Order is in

## 2020-01-27 ENCOUNTER — Other Ambulatory Visit (INDEPENDENT_AMBULATORY_CARE_PROVIDER_SITE_OTHER): Payer: BC Managed Care – PPO

## 2020-01-27 DIAGNOSIS — Z87898 Personal history of other specified conditions: Secondary | ICD-10-CM

## 2020-01-27 DIAGNOSIS — K760 Fatty (change of) liver, not elsewhere classified: Secondary | ICD-10-CM | POA: Diagnosis not present

## 2020-01-27 DIAGNOSIS — Z8719 Personal history of other diseases of the digestive system: Secondary | ICD-10-CM

## 2020-01-27 LAB — HEPATIC FUNCTION PANEL
ALT: 64 U/L — ABNORMAL HIGH (ref 0–53)
AST: 34 U/L (ref 0–37)
Albumin: 4.8 g/dL (ref 3.5–5.2)
Alkaline Phosphatase: 73 U/L (ref 39–117)
Bilirubin, Direct: 0.2 mg/dL (ref 0.0–0.3)
Total Bilirubin: 1.4 mg/dL — ABNORMAL HIGH (ref 0.2–1.2)
Total Protein: 6.8 g/dL (ref 6.0–8.3)

## 2020-08-27 ENCOUNTER — Telehealth: Payer: Self-pay | Admitting: *Deleted

## 2020-08-27 NOTE — Telephone Encounter (Signed)
Patient called and wanted to go ahead and order his next set of Red vials that he receives at home by a family member in the medical field. He has been placed on the schedule to come and pick up his vials and will bring in his shot records at that time.

## 2020-08-30 DIAGNOSIS — J3089 Other allergic rhinitis: Secondary | ICD-10-CM

## 2020-08-30 NOTE — Progress Notes (Signed)
VIALS EXP 08-30-21 

## 2020-09-12 NOTE — Patient Instructions (Signed)
Allergic rhinitis Continue allergen avoidance measures directed toward grass pollen, mold, dust mite, and cockroach as listed below Continue allergen immunotherapy once every 4 weeks and have access to an epinephrine autoinjector set Continue cetirizine 10 mg once a day as needed for a runny nose Continue azelastine 2 sprays in each nostril twice a day as needed for a runny nose Consider saline nasal rinses as needed for nasal symptoms. Use this before any medicated nasal sprays for best result  Call the clinic if this treatment plan is not working well for you  Follow up in 1 year or sooner if needed.  Reducing Pollen Exposure The American Academy of Allergy, Asthma and Immunology suggests the following steps to reduce your exposure to pollen during allergy seasons. 1. Do not hang sheets or clothing out to dry; pollen may collect on these items. 2. Do not mow lawns or spend time around freshly cut grass; mowing stirs up pollen. 3. Keep windows closed at night.  Keep car windows closed while driving. 4. Minimize morning activities outdoors, a time when pollen counts are usually at their highest. 5. Stay indoors as much as possible when pollen counts or humidity is high and on windy days when pollen tends to remain in the air longer. 6. Use air conditioning when possible.  Many air conditioners have filters that trap the pollen spores. 7. Use a HEPA room air filter to remove pollen form the indoor air you breathe.   Control of Dust Mite Allergen Dust mites play a major role in allergic asthma and rhinitis. They occur in environments with high humidity wherever human skin is found. Dust mites absorb humidity from the atmosphere (ie, they do not drink) and feed on organic matter (including shed human and animal skin). Dust mites are a microscopic type of insect that you cannot see with the naked eye. High levels of dust mites have been detected from mattresses, pillows, carpets, upholstered  furniture, bed covers, clothes, soft toys and any woven material. The principal allergen of the dust mite is found in its feces. A gram of dust may contain 1,000 mites and 250,000 fecal particles. Mite antigen is easily measured in the air during house cleaning activities. Dust mites do not bite and do not cause harm to humans, other than by triggering allergies/asthma.  Ways to decrease your exposure to dust mites in your home:  1. Encase mattresses, box springs and pillows with a mite-impermeable barrier or cover  2. Wash sheets, blankets and drapes weekly in hot water (130 F) with detergent and dry them in a dryer on the hot setting.  3. Have the room cleaned frequently with a vacuum cleaner and a damp dust-mop. For carpeting or rugs, vacuuming with a vacuum cleaner equipped with a high-efficiency particulate air (HEPA) filter. The dust mite allergic individual should not be in a room which is being cleaned and should wait 1 hour after cleaning before going into the room.  4. Do not sleep on upholstered furniture (eg, couches).  5. If possible removing carpeting, upholstered furniture and drapery from the home is ideal. Horizontal blinds should be eliminated in the rooms where the person spends the most time (bedroom, study, television room). Washable vinyl, roller-type shades are optimal.  6. Remove all non-washable stuffed toys from the bedroom. Wash stuffed toys weekly like sheets and blankets above.  7. Reduce indoor humidity to less than 50%. Inexpensive humidity monitors can be purchased at most hardware stores. Do not use a humidifier as  can make the problem worse and are not recommended.  Control of Cockroach Allergen  Cockroach allergen has been identified as an important cause of acute attacks of asthma, especially in urban settings.  There are fifty-five species of cockroach that exist in the [place], however only three, the [place], [place] species produce  allergen that can affect patients with Asthma.  Allergens can be obtained from fecal particles, egg casings and secretions from cockroaches.    1. Remove food sources. 2. Reduce access to water. 3. Seal access and entry points. 4. Spray runways with 0.5-1% Diazinon or Chlorpyrifos 5. Blow boric acid power under stoves and refrigerator. 6. Place bait stations (hydramethylnon) at feeding sites.  Control of Mold Allergen Mold and fungi can grow on a variety of surfaces provided certain temperature and moisture conditions exist.  Outdoor molds grow on plants, decaying vegetation and soil.  The major outdoor mold, Alternaria and Cladosporium, are found in very high numbers during hot and dry conditions.  Generally, a late Summer - Fall peak is seen for common outdoor fungal spores.  Rain will temporarily lower outdoor mold spore count, but counts rise rapidly when the rainy period ends.  The most important indoor molds are Aspergillus and Penicillium.  Dark, humid and poorly ventilated basements are ideal sites for mold growth.  The next most common sites of mold growth are the bathroom and the kitchen.  Outdoor [employer] 8. Use air conditioning and keep windows closed 9. Avoid exposure to decaying vegetation. 10. Avoid leaf raking. 11. Avoid grain handling. 12. Consider wearing a face mask if working in moldy areas.  Indoor Mold Control 1. Maintain humidity below 50%. 2. Clean washable surfaces with 5% bleach solution. 3. Remove sources e.g. Contaminated carpets.

## 2020-09-12 NOTE — Progress Notes (Signed)
[address] [address] Dept: [phone]  FOLLOW UP NOTE  Patient ID: [name], male    DOB: 03-11-1981  Age: 40 y.o. MRN: [id number] Date of Office Visit: 09/13/2020  Assessment  Chief Complaint: Allergic Rhinitis  (No sinus issues since treatment plan has ben in place.)  HPI [name] is a 40 year old male who presents to the clinic for follow-up visit.  [name] was last seen in this clinic on 08/09/2019 for evaluation of seasonal and perennial allergic rhinitis and history of cataracts.  At today's visit [name] reports allergic rhinitis has been well controlled with nasal congestion occurring for 1 to 3 days after receiving allergen immunotherapy and occasional postnasal drainage.  [name] continues Allegra during the fall and winter and uses Claritin the other seasons.  [name] continues azelastine as needed with relief of symptoms.  [name] stopped using Flonase at his last visit to this clinic due to cataracts [name] continues allergen immunotherapy with no large local reactions.  [name] reports a significant decrease in his symptoms of allergic rhinitis while continuing allergen immunotherapy.  His current medications are listed in the chart.   Drug Allergies:  Allergies  Allergen Reactions  . Keflex [Cephalexin] Anaphylaxis and Rash  . Penicillins Other (See Comments)    Childhood Has patient had a PCN reaction causing immediate rash, facial/tongue/throat swelling, SOB or lightheadedness with hypotension: uknown Has patient had a PCN reaction causing severe rash involving mucus membranes or skin necrosis: unknown Has patient had a PCN reaction that required hospitalization unknown Has patient had a PCN reaction occurring within the last 10 years: uknown If all of the above answers are "NO", then may proceed with Cephalosporin use.   . Sulfa Antibiotics Other (See Comments)    Unknown. Childhood Allergy    Physical Exam: BP 124/86   Pulse 85   Temp 98 F (36.7 C)   Resp  16   Ht 6\' 2"  (1.88 m)   Wt 269 lb 6.4 oz (122.2 kg)   SpO2 98%   BMI 34.59 kg/m    Physical Exam Vitals reviewed.  Constitutional:      Appearance: Normal appearance.  HENT:     Head: Normocephalic and atraumatic.     Right Ear: Tympanic membrane normal.     Left Ear: Tympanic membrane normal.     Nose:     Comments: Bilateral nares slightly erythematous with clear nasal drainage noted.  Pharynx normal.  Ears normal.  Eyes normal.    Mouth/Throat:     Pharynx: Oropharynx is clear.  Eyes:     Conjunctiva/sclera: Conjunctivae normal.  Cardiovascular:     Rate and Rhythm: Normal rate and regular rhythm.     Heart sounds: Normal heart sounds. No murmur heard.   Pulmonary:     Effort: Pulmonary effort is normal.     Breath sounds: Normal breath sounds.     Comments: Lungs clear to auscultation Musculoskeletal:        General: Normal range of motion.     Cervical back: Normal range of motion and neck supple.  Skin:    General: Skin is warm and dry.  Neurological:     Mental Status: [name] is alert and oriented to person, place, and time.  Psychiatric:        Mood and Affect: Mood normal.        Behavior: Behavior normal.        Thought Content: Thought content normal.  Judgment: Judgment normal.     Assessment and Plan: 1. Seasonal and perennial allergic rhinitis     Meds ordered this encounter  Medications  . azelastine (ASTELIN) 0.1 % nasal spray    Sig: Place 2 sprays into both nostrils daily as needed for rhinitis.    Dispense:  30 mL    Refill:  5  . EPINEPHrine (AUVI-Q) 0.3 mg/0.3 mL IJ SOAJ injection    Sig: Inject 0.3 mg into the muscle as needed for anaphylaxis.    Dispense:  2 each    Refill:  1    Patient Instructions  Allergic rhinitis Continue allergen avoidance measures directed toward grass pollen, mold, dust mite, and cockroach as listed below Continue allergen immunotherapy once every 4 weeks and have access to an epinephrine autoinjector  set Continue cetirizine 10 mg once a day as needed for a runny nose Continue azelastine 2 sprays in each nostril twice a day as needed for a runny nose Consider saline nasal rinses as needed for nasal symptoms. Use this before any medicated nasal sprays for best result  Call the clinic if this treatment plan is not working well for you  Follow up in 1 year or sooner if needed.    Return in about 1 year (around 09/13/2021), or if symptoms worsen or fail to improve.    Thank you for the opportunity to care for this patient.  Please do not hesitate to contact me with questions.  Thermon Leyland, FNP [facility]

## 2020-09-13 ENCOUNTER — Other Ambulatory Visit: Payer: Self-pay

## 2020-09-13 ENCOUNTER — Ambulatory Visit (INDEPENDENT_AMBULATORY_CARE_PROVIDER_SITE_OTHER): Payer: BC Managed Care – PPO | Admitting: Family Medicine

## 2020-09-13 ENCOUNTER — Encounter: Payer: Self-pay | Admitting: Family Medicine

## 2020-09-13 VITALS — BP 124/86 | HR 85 | Temp 98.0°F | Resp 16 | Ht 74.0 in | Wt 269.4 lb

## 2020-09-13 DIAGNOSIS — J3089 Other allergic rhinitis: Secondary | ICD-10-CM

## 2020-09-13 DIAGNOSIS — J302 Other seasonal allergic rhinitis: Secondary | ICD-10-CM | POA: Diagnosis not present

## 2020-09-13 MED ORDER — EPINEPHRINE 0.3 MG/0.3ML IJ SOAJ: 0.3000 mg | INTRAMUSCULAR | 1 refills | Status: DC | PRN

## 2020-09-13 MED ORDER — AZELASTINE HCL 0.1 % NA SOLN: 2.0000 | Freq: Every day | NASAL | 5 refills | Status: DC | PRN

## 2020-09-17 ENCOUNTER — Ambulatory Visit (INDEPENDENT_AMBULATORY_CARE_PROVIDER_SITE_OTHER): Payer: BC Managed Care – PPO | Admitting: *Deleted

## 2020-09-17 ENCOUNTER — Other Ambulatory Visit: Payer: Self-pay

## 2020-09-17 DIAGNOSIS — J309 Allergic rhinitis, unspecified: Secondary | ICD-10-CM | POA: Diagnosis not present

## 2020-09-17 NOTE — Progress Notes (Signed)
Immunotherapy   Patient Details  Name: [name] MRN: [id number] Date of Birth: [birth year]  09/17/2020  [name] here to pick up  GRASS-DM, MoLD-CR Following schedule: C  Frequency:1 time per week @ .50 Every 3 Weeks. (Every 4 Weeks 12/2020) Epi-Pen:Epi-Pen Available  Consent signed and patient instructions given. Patient received 0.50mL of GRASS-DM in the LUA and 0.60mL of MOLD-CR in the RUA. Patient waited 30 minutes and did not experience any issues. He picked up his vials and will continue receiving his injections at home by [name] who is an Char[employer] fundraiser.   [name] 09/17/2020, 3:17 PM

## 2021-05-15 ENCOUNTER — Encounter: Payer: Self-pay | Admitting: Gastroenterology

## 2021-05-15 ENCOUNTER — Ambulatory Visit (INDEPENDENT_AMBULATORY_CARE_PROVIDER_SITE_OTHER): Payer: BC Managed Care – PPO | Admitting: Gastroenterology

## 2021-05-15 VITALS — BP 152/80 | HR 96 | Ht 74.0 in | Wt 269.4 lb

## 2021-05-15 DIAGNOSIS — Z87898 Personal history of other specified conditions: Secondary | ICD-10-CM

## 2021-05-15 DIAGNOSIS — Z8719 Personal history of other diseases of the digestive system: Secondary | ICD-10-CM | POA: Diagnosis not present

## 2021-05-15 DIAGNOSIS — K760 Fatty (change of) liver, not elsewhere classified: Secondary | ICD-10-CM

## 2021-05-15 NOTE — Patient Instructions (Addendum)
If you are age 40 or older, your body mass index should be between 23-30. Your Body mass index is 34.59 kg/m. If this is out of the aforementioned range listed, please consider follow up with your Primary Care Provider.  If you are age 62 or younger, your body mass index should be between 19-25. Your Body mass index is 34.59 kg/m. If this is out of the aformentioned range listed, please consider follow up with your Primary Care Provider.   ________________________________________________________  The  GI providers would like to encourage you to use [facility] to communicate with providers for non-urgent requests or questions.  Due to long hold times on the telephone, sending your provider a message by [facility] may be a faster and more efficient way to get a response.  Please allow 48 business hours for a response.  Please remember that this is for non-urgent requests.  _______________________________________________________   Please go to the lab in the basement of our building to have fasting lab work tomorrow. Hit "B" for basement when you get on the elevator.  When the doors open the lab is on your left.  We will call you with the results. Thank you.   Thank you for entrusting me with your care and for choosing [facility], Dr. Ileene Patrick

## 2021-05-15 NOTE — Progress Notes (Signed)
HPI :  40 year old male here for a follow-up visit for history of intra-abdominal abscess / small bowel obstructions, and fatty liver.   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 He was treated with percutaneous drainage initially and IV antibiotics. All drains were removed by March 2018 at which time the fistula had apparently resolved. Readmitted 10/12/2016 with recurrent intra-abdominal abscesses and bowel and mesenteric inflammation. He ultimately required a diagnostic laparotomy and drainage of abdominal abscess and ileocecectomy per Dr. Rowan Blase. Path from the small bowel specimen was benign with serosal adhesions and serositis and areas of foreign body giant cell reaction presumably from prior perforation, he had a benign appendix, no evidence for IBD, dysplasia or malignancy.     Readmitted in September 2018 with abdominal pain nausea and vomiting and was diagnosed with a small bowel obstruction. CT scan on 04/06/2017 showed moderate inflammatory changes within the abdomen and upper pelvis, poorly defined inflammatory process in the right lower quadrant/upper pelvis centered around nondilated loops of small bowel could not rule out IBD was a dilated segment of small bowel in the upper pelvis and mild diffuse generalized inflammation within the peritoneal cavity and central mesentery sigmoid colon diverticular disease. He was initially treated with antibiotics, and NG decompression. He had repeat CT scan on 04/10/2017 which raised question of possible sigmoid diverticulitis with suspected fluid collection/abscess 11 cm in diameter along with thickening of the mid to distal small bowel. Images were reviewed with IR or potential drainage and it was not felt that the fluid represented an abscess., and did not feel it would be  accessible via IR approach. Patient improved quickly, tolerated a bowel prep and underwent colonoscopy on 04/15/2017. This was a normal exam with end-to-end ileocolonic anastomosis in the ascending colon, multiple left colon diverticuli no evidence of diverticulitis and he had a normal terminal ileum. TI was unable to be deeply intubated due to angulation. Biopsies were taken and these returned showing normal mucosa".   Capsule endoscopy in November 2019 - the exam showed a normal small bowel transit and essentially normal mucosa - there was no evidence of any inflammatory changes or Crohn's disease.       I have not seen the patient since February 2021.  He states he has been feeling really well in regards to his abdomen.  No abdominal pains or recurrence of obstructive symptoms.  He does have some food intolerance, has some loose stools after eating particular foods but generally not too bothersome.  He does not have any blood in his stools on a routine basis.  Recall that we have also been have been following him for fatty liver disease.  His ALT had been in the low 100s, he has had fatty liver noted on prior imaging studies.  Negative serologic work-up.  Since I last saw him he unfortunately had gained some weight, now 269 pounds.  This past March he bought a rowing machine and has been using that routinely and states that has really been helping and he thinks he will be able to get improvement with his weight loss using this.  He has not been using any alcohol at all.  He has been extremely busy at work and things seem to be slowing down a bit.  Otherwise he is feeling well without complaints.  He drinks coffee routinely. He  was immunized hepatitis A.   Component Ref Range & Units 1 yr ago  (01/27/20) 1 yr ago  (08/01/19) 2 yr ago  (04/26/19) 2 yr ago  (12/10/18) 2 yr ago  (07/02/18) 4 yr ago  (04/08/17) 4 yr ago  (04/07/17)  Total Bilirubin 0.2 - 1.2 mg/dL 1.4 High   1.2  1.4 High   2.0 High   2.0  High   3.3 High  R  4.5 High  R   Bilirubin, Direct 0.0 - 0.3 mg/dL 0.2  0.2  0.2  0.3      Alkaline Phosphatase 39 - 117 U/L 73  66  74  68   50 R  51 R   AST 0 - 37 U/L 34  35  41 High   39 High   41 High  R  15 R  16 R   ALT 0 - 53 U/L 64 High   67 High   102 High   87 High   81 High  R  27 R  34 R   Total Protein 6.0 - 8.3 g/dL 6.8  7.1  7.0  7.1  7.0 R  5.8 Low  R  5.7 Low  R   Albumin 3.5 - 5.2 g/dL 4.8  4.6  4.6  4.7   3.2 Low  R  3.4 Low  R       Past Medical History:  Diagnosis Date   Fatty liver    Legionella pneumonia (HCC)    2009     Past Surgical History:  Procedure Laterality Date   COLONOSCOPY WITH PROPOFOL N/A 04/15/2017   Procedure: COLONOSCOPY WITH PROPOFOL;  Surgeon: Benancio Deeds, MD;  Location: [facility];  Service: Gastroenterology;  Laterality: N/A;   HERNIA REPAIR     IR GENERIC HISTORICAL  09/09/2016   IR RADIOLOGIST EVAL & MGMT 09/09/2016 Berdine Dance, MD [facility] INTERV RAD   IR RADIOLOGIST EVAL & MGMT  09/23/2016   KNEE ARTHROSCOPY Right    KNEE SURGERY Left    LAPAROSCOPIC SMALL BOWEL RESECTION N/A 10/16/2016   Procedure: LAPAROSCOPIC SMALL BOWEL RESECTION POSSIBLE EXPLORATORY LAPAROTOMY AND POSSIBLE OPEN WASH OUT ileocectomy;  Surgeon: Almond Lint, MD;  Location: [facility] ORS;  Service: General;  Laterality: N/A;   Family History  Problem Relation Age of Onset   Mental illness Mother    Allergic rhinitis Mother    Diabetes Father    Cancer Paternal Grandfather        oral cancer   Angioedema Neg Hx    Asthma Neg Hx    Eczema Neg Hx    Colon cancer Neg Hx    Esophageal cancer Neg Hx    Stomach cancer Neg Hx    Pancreatic cancer Neg Hx    Liver disease Neg Hx    Social History   Tobacco Use   Smoking status: Never   Smokeless tobacco: Never  Vaping Use   Vaping Use: Never used  Substance Use Topics   Alcohol use: No    Alcohol/week: 0.0 standard drinks   Drug use: No   Current Outpatient Medications  Medication Sig Dispense  Refill   AMBULATORY NON FORMULARY MEDICATION Allergy injections-once monthly     EPINEPHrine (AUVI-Q) 0.3 mg/0.3 mL IJ SOAJ injection Inject 0.3 mg into the muscle as needed for anaphylaxis. 2 each 1   loratadine (CLARITIN) 10 MG tablet Take 10 mg by mouth daily.     meclizine (ANTIVERT) 25 MG tablet TAKE 1  TABLET BY MOUTH 3 TIMES DAILY AS NEEDED FOR UP TO 10 DAYS FOR DIZZINESS.     No current facility-administered medications for this visit.   Allergies  Allergen Reactions   Keflex [Cephalexin] Anaphylaxis and Rash   Penicillins Other (See Comments)    Childhood Has patient had a PCN reaction causing immediate rash, facial/tongue/throat swelling, SOB or lightheadedness with hypotension: uknown Has patient had a PCN reaction causing severe rash involving mucus membranes or skin necrosis: unknown Has patient had a PCN reaction that required hospitalization unknown Has patient had a PCN reaction occurring within the last 10 years: uknown If all of the above answers are "NO", then may proceed with Cephalosporin use.    Sulfa Antibiotics Other (See Comments)    Unknown. Childhood Allergy     Review of Systems: All systems reviewed and negative except where noted in HPI.    No results found.  Labs per HPI  Physical Exam: BP (!) 152/80   Pulse 96   Ht 6\' 2"  (1.88 m)   Wt 269 lb 6.4 oz (122.2 kg)   BMI 34.59 kg/m  Constitutional: Pleasant,well-developed, male in no acute distress. Neurological: Alert and oriented to person place and time. Psychiatric: Normal mood and affect. Behavior is normal.   ASSESSMENT AND PLAN: 40 year old male here for reassessment of following:  Fatty liver History of intra-abdominal abscess History of small bowel obstruction  Surgical history as outlined above, this ultimately was thought to be due to a foreign body reaction following ingestion.  It is not thought that he has had Crohn's disease.  There is been no evidence of that on follow-up  colonoscopy and since he has recovered from this hospital course he has not had any problems.  He will keep a very close eye on his moving forward.  He had 9 CT scans done in 2018, we had discussed surveillance imaging but will hold off on that for now given the amount of radiation he has had and that he is feeling well.  Otherwise he has had significant weight gain since have seen him and has a history of fatty liver with elevated liver enzymes.  We discussed history of fatty liver, risks of NASH and cirrhosis moving forward.  Really needs to work on weight loss and seems motivated to use his rowing machine to help do that.  He is due for LFTs, I will also screen for diabetes and check his lipids and is overdue for that as well.  Going to keep an eye on his LFTs moving forward.  He drinks coffee routinely which is good for his liver.  If enzymes are persistently elevated we may need to consider noninvasive measurement for fibrotic change or liver biopsy to see if he would be a candidate for vitamin E if he has developed NASH. He agrees.  2019, MD [facility]

## 2021-06-11 ENCOUNTER — Other Ambulatory Visit (INDEPENDENT_AMBULATORY_CARE_PROVIDER_SITE_OTHER): Payer: BC Managed Care – PPO

## 2021-06-11 ENCOUNTER — Other Ambulatory Visit: Payer: Self-pay

## 2021-06-11 DIAGNOSIS — Z87898 Personal history of other specified conditions: Secondary | ICD-10-CM

## 2021-06-11 DIAGNOSIS — R748 Abnormal levels of other serum enzymes: Secondary | ICD-10-CM

## 2021-06-11 DIAGNOSIS — Z8719 Personal history of other diseases of the digestive system: Secondary | ICD-10-CM

## 2021-06-11 DIAGNOSIS — K760 Fatty (change of) liver, not elsewhere classified: Secondary | ICD-10-CM | POA: Diagnosis not present

## 2021-06-11 LAB — LIPID PANEL
Cholesterol: 162 mg/dL (ref 0–200)
HDL: 42.1 mg/dL (ref >39.00)
LDL Cholesterol: 91 mg/dL (ref 0–99)
NonHDL: 120.04
Total CHOL/HDL Ratio: 4
Triglycerides: 143 mg/dL (ref 0.0–149.0)
VLDL: 28.6 mg/dL (ref 0.0–40.0)

## 2021-06-11 LAB — HEMOGLOBIN A1C: Hgb A1c MFr Bld: 5.1 % (ref 4.6–6.5)

## 2021-06-11 LAB — HEPATIC FUNCTION PANEL
ALT: 102 U/L — ABNORMAL HIGH (ref 0–53)
AST: 50 U/L — ABNORMAL HIGH (ref 0–37)
Albumin: 4.6 g/dL (ref 3.5–5.2)
Alkaline Phosphatase: 60 U/L (ref 39–117)
Bilirubin, Direct: 0.3 mg/dL (ref 0.0–0.3)
Total Bilirubin: 2 mg/dL — ABNORMAL HIGH (ref 0.2–1.2)
Total Protein: 6.9 g/dL (ref 6.0–8.3)

## 2021-06-24 ENCOUNTER — Encounter: Payer: Self-pay | Admitting: Family Medicine

## 2021-06-24 ENCOUNTER — Other Ambulatory Visit: Payer: Self-pay

## 2021-06-24 ENCOUNTER — Ambulatory Visit (INDEPENDENT_AMBULATORY_CARE_PROVIDER_SITE_OTHER): Payer: BC Managed Care – PPO | Admitting: Family Medicine

## 2021-06-24 VITALS — BP 138/78 | HR 83 | Temp 98.3°F | Resp 18 | Ht 74.0 in | Wt 257.0 lb

## 2021-06-24 DIAGNOSIS — Z0000 Encounter for general adult medical examination without abnormal findings: Secondary | ICD-10-CM

## 2021-06-24 NOTE — Progress Notes (Signed)
Subjective:    Patient ID: [name], male    DOB: Dec 10, 1980, 40 y.o.   MRN: [id number]  HPI  04/2017 The patient has a very complicated PMH.  He was originally admitted on January 31 with pneumoperitoneum secondary to microperforation due to foreign body.  This was treated by surgery with percutaneous drain. CT showed resolution of pneumoperitoneum and he was discharged on February 7 and the last drain was removed on March 6. He was then readmitted on March 25 and was found to have an abscess in the right mid pelvis. He underwent diagnostic laparoscopy and drainage by surgery and was discharged on April 2. He had done well until 04/05/2002 admitted with sepsis due to colitis with small bowel obstruction. I have copied the CT results and included them below for my reference: 9/17 Moderate inflammatory changes within the abdomen and upper pelvis with poorly defined inflammatory process in the right lower quadrant/upper pelvis. This appears to be centered around non dilated loops of small bowel, suggesting small bowel inflammation, possibly due to inflammatory bowel disease. Dilated segment of small bowel within the upper pelvis could relate to localized ileus. Mild diffuse generalized inflammation within the peritoneal cavity/central mesentery. No free air at this time. No organized abscess. There are mild changes of the transverse colon which could be due to mild colitis. 2. Sigmoid colon diverticular disease. 3. Hepatic steatosis. Stable small hyperenhancing focus in the dome of the liver, possibly a flash hemangioma. 9/21 Multiple previous examinations have been reviewed and I am concerned the patient has experienced a recurrent episode of acute diverticulitis secondary to a small diverticulum arising from the anterior aspect of the sigmoid colon. This small diverticulum is associated with several small foci of pneumoperitoneum and appears to contain a serpiginous communication  to a developing suspected abscess within the root of the abdominal mesentery measuring approximately 11.6 cm in diameter. 2. Interval progression of circumferential bowel wall thickening involving several loops of mid and distal small bowel, not resulting in enteric obstruction - while potentially secondary to a concomitant inflammatory etiology (such as Crohn's colitis), I favor these loops are secondarily involved due to their proximity to the suspected developing abscess within the root of the abdominal Mesentery. Patient was treated with several weeks of antibiotics and was discharged from the hospital on September 27. Patient had a colonoscopy prior to discharge from the hospital that was reportedly negative and showed no evidence of Crohn's colitis he is still perplexed as to the reason this continues to happen. He is yet to follow-up with GI. He would like a referral to see an allergist. He believes that he has food allergies that may be triggering this. Particularly he becomes extremely nauseated and experiences reflux nausea and abdominal pain whenever he eats chicken.  He denies any bloody diarrhea.  At that time, my plan was: I will gladly consult an allergist although as I explained to the patient, I do not feel food allergies are the root of his problem.  I feel one of 2 processes are at play. Either the patient has Crohn's colitis and is having recurrent exacerbations that lead to infections/abscess or patient continues to have episodic leakage from a microperforation in a diverticuli that was the original event in March but continues to have frequent exacerbations requiring hospitalization. I have recommended that he follow-up with GI for capsule endoscopy to rule out inflammatory bowel disease.  If the patient continues to have recurrent diverticulitis with pelvic abscess, he may require  partial bowel resection of the involved portion of the sigmoid colon with a microperforation occurred.  I will defer to GI and surgery at the present time. I will consult an allergist per the patient's request  07/02/18 Patient is here today for a complete physical exam.  He states that he somewhat feels back to his normal self now.  He states that once or twice a month he will get severe crampy intestinal pain which he treats with Bentyl.  This will be followed by some diarrhea however the Bentyl helps ease the pain and make it abate over a few minutes.  Otherwise he is doing well.  He denies any fevers chills weight loss or hematochezia.  He is otherwise doing well.  His flu shot is up-to-date.  He is due for a tetanus shot.  Due to his age he does not require any cancer screening.  He had a CBC obtained at his gastroenterologist office that was significant for a white blood cell count of 11.1 however he states that he had a sinus infection that day.  Overall he is doing well.  He denies any fevers chills or night sweats or body aches.  06/24/21  Patient is doing very well.  He is here today for physical exam.  He denies any GI issues.  He is seeing GI regarding fatty liver disease.  LFTs are slightly elevated earlier this year he is starting extensive exercise regimen and has lost about 10 pounds.  He is scheduled to see GI again in about 3 months repeat blood work.  They checked his cholesterol which was outstanding as well as blood sugar which was outstanding.  He has not had a creatinine checked for a CBC checked.  His immunizations are up-to-date except for the most recent COVID booster. 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   Past Surgical History:  Procedure Laterality Date   COLONOSCOPY WITH PROPOFOL N/A 04/15/2017   Procedure: COLONOSCOPY WITH PROPOFOL;  Surgeon: Yetta Flock, MD;  Location: [facility];  Service: Gastroenterology;  Laterality: N/A;   HERNIA REPAIR     IR GENERIC HISTORICAL  09/09/2016   IR RADIOLOGIST EVAL &  MGMT 09/09/2016 Greggory Keen, MD [facility] INTERV RAD   IR RADIOLOGIST EVAL & MGMT  09/23/2016   KNEE ARTHROSCOPY Right    KNEE SURGERY Left    LAPAROSCOPIC SMALL BOWEL RESECTION N/A 10/16/2016   Procedure: LAPAROSCOPIC SMALL BOWEL RESECTION POSSIBLE EXPLORATORY LAPAROTOMY AND POSSIBLE OPEN [place] OUT ileocectomy;  Surgeon: Stark Klein, MD;  Location: [facility];  Service: General;  Laterality: N/A;   Current Outpatient Medications on File Prior to Visit  Medication Sig Dispense Refill   EPINEPHrine (AUVI-Q) 0.3 mg/0.3 mL IJ SOAJ injection Inject 0.3 mg into the muscle as needed for anaphylaxis. 2 each 1   loratadine (CLARITIN) 10 MG tablet Take 10 mg by mouth daily.     meclizine (ANTIVERT) 25 MG tablet TAKE 1 TABLET BY MOUTH 3 TIMES DAILY AS NEEDED FOR UP TO 10 DAYS FOR DIZZINESS.     No current facility-administered medications on file prior to visit.   Allergies  Allergen Reactions   Keflex [Cephalexin] Anaphylaxis and Rash   Penicillins Other (See Comments)    Childhood Has patient had a PCN reaction causing immediate rash, facial/tongue/throat swelling, SOB or lightheadedness with hypotension: uknown Has patient had a PCN reaction causing severe rash involving mucus membranes or skin necrosis: unknown  Has patient had a PCN reaction that required hospitalization unknown Has patient had a PCN reaction occurring within the last 10 years: uknown If all of the above answers are "NO", then may proceed with Cephalosporin use.    Sulfa Antibiotics Other (See Comments)    Unknown. Childhood Allergy   Social History   Socioeconomic History   Marital status: Married    Spouse name: Not on file   Number of children: Not on file   Years of education: Not on file   Highest education level: Not on file  Occupational History   Not on file  Tobacco Use   Smoking status: Never   Smokeless tobacco: Never  Vaping Use   Vaping Use: Never used  Substance and Sexual Activity   Alcohol use: No     Alcohol/week: 0.0 standard drinks   Drug use: No   Sexual activity: Yes    Comment: married, works at [employer]  Other Topics Concern   Not on file  Social History Narrative   Not on file   Social Determinants of Health   Financial Resource Strain: Not on file  Food Insecurity: Not on file  Transportation Needs: Not on file  Physical Activity: Not on file  Stress: Not on file  Social Connections: Not on file  Intimate Partner Violence: Not on file    Review of Systems  All other systems reviewed and are negative.     Objective:   Physical Exam Vitals reviewed.  Constitutional:      General: He is not in acute distress.    Appearance: He is well-developed. He is not diaphoretic.  HENT:     Head: Normocephalic and atraumatic.     Right Ear: External ear normal.     Left Ear: External ear normal.     Nose: Nose normal.     Mouth/Throat:     Pharynx: No oropharyngeal exudate.  Eyes:     General: No scleral icterus.       Right eye: No discharge.        Left eye: No discharge.     Conjunctiva/sclera: Conjunctivae normal.     Pupils: Pupils are equal, round, and reactive to light.  Neck:     Thyroid: No thyromegaly.     Vascular: No JVD.     Trachea: No tracheal deviation.  Cardiovascular:     Rate and Rhythm: Normal rate and regular rhythm.     Heart sounds: Normal heart sounds. No murmur heard.   No friction rub. No gallop.  Pulmonary:     Effort: Pulmonary effort is normal. No respiratory distress.     Breath sounds: Normal breath sounds. No stridor. No wheezing or rales.  Chest:     Chest wall: No tenderness.  Abdominal:     General: Bowel sounds are normal. There is no distension.     Palpations: Abdomen is soft. There is no mass.     Tenderness: There is no abdominal tenderness. There is no guarding or rebound.  Musculoskeletal:        General: No tenderness or deformity. Normal range of motion.     Cervical back: Normal range of motion and neck supple.   Lymphadenopathy:     Cervical: No cervical adenopathy.  Skin:    General: Skin is warm.     Coloration: Skin is not pale.     Findings: No erythema or rash.  Neurological:     Mental Status: He is alert and oriented  to person, place, and time.     Cranial Nerves: No cranial nerve deficit.     Motor: No abnormal muscle tone.     Coordination: Coordination normal.     Deep Tendon Reflexes: Reflexes are normal and symmetric. Reflexes normal.  Psychiatric:        Behavior: Behavior normal.        Thought Content: Thought content normal.        Judgment: Judgment normal.   Surgical scar in lower pelvis.       Assessment & Plan:  General medical exam - Plan: CBC with Differential/Platelet, BASIC METABOLIC PANEL WITH GFR Physical exam today is completely normal.  Recommended diet exercise and weight loss.  Recommended the patient monitor his blood pressure as it is borderline.  Recommended a COVID booster.  Check BMP and CBC.  LFTs and cholesterol and A1c from GI were reviewed and were normal except for mild elevations in liver function tests which they are planning to repeat in 3 months

## 2021-06-25 LAB — CBC WITH DIFFERENTIAL/PLATELET
Absolute Monocytes: 508 cells/uL (ref 200–950)
Basophils Absolute: 50 cells/uL (ref 0–200)
Basophils Relative: 0.8 %
Eosinophils Absolute: 149 cells/uL (ref 15–500)
Eosinophils Relative: 2.4 %
HCT: 49.1 % (ref 38.5–50.0)
Hemoglobin: 16.9 g/dL (ref 13.2–17.1)
Lymphs Abs: 2213 cells/uL (ref 850–3900)
MCH: 32.4 pg (ref 27.0–33.0)
MCHC: 34.4 g/dL (ref 32.0–36.0)
MCV: 94.1 fL (ref 80.0–100.0)
MPV: 11.4 fL (ref 7.5–12.5)
Monocytes Relative: 8.2 %
Neutro Abs: 3280 cells/uL (ref 1500–7800)
Neutrophils Relative %: 52.9 %
Platelets: 194 10*3/uL (ref 140–400)
RBC: 5.22 10*6/uL (ref 4.20–5.80)
RDW: 12.2 % (ref 11.0–15.0)
Total Lymphocyte: 35.7 %
WBC: 6.2 10*3/uL (ref 3.8–10.8)

## 2021-06-25 LAB — BASIC METABOLIC PANEL WITH GFR
BUN: 19 mg/dL (ref 7–25)
CO2: 25 mmol/L (ref 20–32)
Calcium: 10.2 mg/dL (ref 8.6–10.3)
Chloride: 104 mmol/L (ref 98–110)
Creat: 0.87 mg/dL (ref 0.60–1.29)
Glucose, Bld: 95 mg/dL (ref 65–99)
Potassium: 4.9 mmol/L (ref 3.5–5.3)
Sodium: 139 mmol/L (ref 135–146)
eGFR: 112 mL/min/{1.73_m2} (ref >=60)

## 2021-09-19 ENCOUNTER — Ambulatory Visit: Payer: BC Managed Care – PPO | Admitting: Allergy & Immunology

## 2021-09-19 DIAGNOSIS — J309 Allergic rhinitis, unspecified: Secondary | ICD-10-CM

## 2021-10-09 ENCOUNTER — Telehealth: Payer: Self-pay

## 2021-10-09 NOTE — Telephone Encounter (Signed)
-----   Message from Yevette Edwards, RN sent at 06/11/2021  3:16 PM EST ----- ?Regarding: Labs ?LFT's, the order is in epic. ? ?

## 2021-10-09 NOTE — Telephone Encounter (Signed)
Spoke with patient to remind him that he is due for repeat labs at this time. No appointment is necessary. Patient is aware that he can stop by the lab in the basement at his convenience between 7:30 AM - 5 PM, Monday through Friday. Patient verbalized understanding and had no concerns at the end of the call.   

## 2021-10-14 ENCOUNTER — Other Ambulatory Visit: Payer: Self-pay

## 2021-10-14 ENCOUNTER — Other Ambulatory Visit: Payer: BC Managed Care – PPO

## 2021-10-14 DIAGNOSIS — R748 Abnormal levels of other serum enzymes: Secondary | ICD-10-CM

## 2021-10-14 DIAGNOSIS — K760 Fatty (change of) liver, not elsewhere classified: Secondary | ICD-10-CM

## 2021-10-14 LAB — HEPATIC FUNCTION PANEL
ALT: 76 U/L — ABNORMAL HIGH (ref 0–53)
AST: 41 U/L — ABNORMAL HIGH (ref 0–37)
Albumin: 4.6 g/dL (ref 3.5–5.2)
Alkaline Phosphatase: 67 U/L (ref 39–117)
Bilirubin, Direct: 0.1 mg/dL (ref 0.0–0.3)
Total Bilirubin: 1.4 mg/dL — ABNORMAL HIGH (ref 0.2–1.2)
Total Protein: 6.8 g/dL (ref 6.0–8.3)

## 2022-02-17 ENCOUNTER — Telehealth: Payer: Self-pay

## 2022-02-17 NOTE — Telephone Encounter (Signed)
-----   Message from Missy Sabins, RN sent at 10/14/2021  2:58 PM EDT ----- Regarding: Labs Hepatic function panel, the order is in epic

## 2022-02-17 NOTE — Telephone Encounter (Signed)
Attempted to reach pt twice. I received a message that the vm is invalid. Unable to leave a vm at this time.

## 2022-02-18 NOTE — Telephone Encounter (Signed)
2nd attempt to reach patient on his mobile phone. Automated prompt states that the vm is invalid. No option to leave a vm.  Will mail letter to patient with lab reminder.

## 2022-02-21 ENCOUNTER — Other Ambulatory Visit (INDEPENDENT_AMBULATORY_CARE_PROVIDER_SITE_OTHER): Payer: BC Managed Care – PPO

## 2022-02-21 DIAGNOSIS — K760 Fatty (change of) liver, not elsewhere classified: Secondary | ICD-10-CM

## 2022-02-21 DIAGNOSIS — R748 Abnormal levels of other serum enzymes: Secondary | ICD-10-CM

## 2022-02-21 LAB — HEPATIC FUNCTION PANEL
ALT: 69 U/L — ABNORMAL HIGH (ref 0–53)
AST: 36 U/L (ref 0–37)
Albumin: 4.4 g/dL (ref 3.5–5.2)
Alkaline Phosphatase: 75 U/L (ref 39–117)
Bilirubin, Direct: 0.2 mg/dL (ref 0.0–0.3)
Total Bilirubin: 1.4 mg/dL — ABNORMAL HIGH (ref 0.2–1.2)
Total Protein: 6.9 g/dL (ref 6.0–8.3)

## 2022-05-15 ENCOUNTER — Encounter: Payer: Self-pay | Admitting: Gastroenterology

## 2022-05-15 ENCOUNTER — Ambulatory Visit (INDEPENDENT_AMBULATORY_CARE_PROVIDER_SITE_OTHER): Payer: BC Managed Care – PPO | Admitting: Gastroenterology

## 2022-05-15 VITALS — BP 130/84 | HR 100 | Ht 74.0 in | Wt 266.0 lb

## 2022-05-15 DIAGNOSIS — K9049 Malabsorption due to intolerance, not elsewhere classified: Secondary | ICD-10-CM | POA: Diagnosis not present

## 2022-05-15 DIAGNOSIS — K760 Fatty (change of) liver, not elsewhere classified: Secondary | ICD-10-CM | POA: Diagnosis not present

## 2022-05-15 NOTE — Progress Notes (Signed)
HPI :  41 year old male here for a follow-up visit for history of intra-abdominal abscess / small bowel obstructions, and fatty liver.   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 He was treated with percutaneous drainage initially and IV antibiotics. All drains were removed by March 2018 at which time the fistula had apparently resolved. Readmitted 10/12/2016 with recurrent intra-abdominal abscesses and bowel and mesenteric inflammation. He ultimately required a diagnostic laparotomy and drainage of abdominal abscess and ileocecectomy per Dr. Cher Nakai. Path from the small bowel specimen was benign with serosal adhesions and serositis and areas of foreign body giant cell reaction presumably from prior perforation, he had a benign appendix, no evidence for IBD, dysplasia or malignancy.     Readmitted in September 2018 with abdominal pain nausea and vomiting and was diagnosed with a small bowel obstruction. CT scan on 04/06/2017 showed moderate inflammatory changes within the abdomen and upper pelvis, poorly defined inflammatory process in the right lower quadrant/upper pelvis centered around nondilated loops of small bowel could not rule out IBD was a dilated segment of small bowel in the upper pelvis and mild diffuse generalized inflammation within the peritoneal cavity and central mesentery sigmoid colon diverticular disease. He was initially treated with antibiotics, and NG decompression. He had repeat CT scan on 04/10/2017 which raised question of possible sigmoid diverticulitis with suspected fluid collection/abscess 11 cm in diameter along with thickening of the mid to distal small bowel. Images were reviewed with IR or potential drainage and it was not felt that the fluid represented an abscess., and did not feel it would be  accessible via IR approach. Patient improved quickly, tolerated a bowel prep and underwent colonoscopy on 04/15/2017. This was a normal exam with end-to-end ileocolonic anastomosis in the ascending colon, multiple left colon diverticuli no evidence of diverticulitis and he had a normal terminal ileum. TI was unable to be deeply intubated due to angulation. Biopsies were taken and these returned showing normal mucosa".   Capsule endoscopy in November 2019 - the exam showed a normal small bowel transit and essentially normal mucosa - there was no evidence of any inflammatory changes or Crohn's disease.      Patient here for follow-up visit regarding his fatty liver disease.  Recall he had an ALT elevation dating back to at least 2018. His ALT had been in the low 100s, he has had fatty liver noted on prior imaging studies.  Negative serologic work-up.  At her last visit we discussed the importance of routine exercise and dieting for weight loss, also discussed routine coffee drinking, minimizing alcohol use etc.  Has been doing a good job with exercise, rose frequently and states he feels much better being more active with this.  He is down about 9 pounds or so since I have seen him about a year ago.  He is lost about 2 sizes on his belt.  His ALT has down trended as below over the past several months.  Previously was over 100s, last checked in August was 52.  He does not drink any alcohol.  He drinks coffee daily. He was immunized hepatitis A.  I checked his cholesterol and A1c last year which looked good.  He otherwise has some chronic food intolerance to fresh fruits and vegetables, salads etc. which can make him have loose stools ever since his  bowel operation.  He uses a blender to make it easier for him to tolerate vegetables.   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     Past Surgical History:  Procedure Laterality Date   COLONOSCOPY WITH PROPOFOL N/A 04/15/2017   Procedure: COLONOSCOPY WITH PROPOFOL;  Surgeon: Yetta Flock, MD;  Location: [facility];  Service: Gastroenterology;  Laterality: N/A;   HERNIA REPAIR     IR GENERIC HISTORICAL  09/09/2016   IR RADIOLOGIST EVAL & MGMT 09/09/2016 Greggory Keen, MD [facility] INTERV RAD   IR RADIOLOGIST EVAL & MGMT  09/23/2016   KNEE ARTHROSCOPY Right    KNEE SURGERY Left    LAPAROSCOPIC SMALL BOWEL RESECTION N/A 10/16/2016   Procedure: LAPAROSCOPIC SMALL BOWEL RESECTION POSSIBLE EXPLORATORY LAPAROTOMY AND POSSIBLE OPEN [place] OUT ileocectomy;  Surgeon: Stark Klein, MD;  Location: [facility];  Service: General;  Laterality: N/A;   Family History  Problem Relation Age of Onset   Mental illness Mother    Allergic rhinitis Mother    Diabetes Father    Cancer Paternal Grandfather        oral cancer   Angioedema Neg Hx    Asthma Neg Hx    Eczema Neg Hx    Colon cancer Neg Hx    Esophageal cancer Neg Hx    Stomach cancer Neg Hx    Pancreatic cancer Neg Hx    Liver disease Neg Hx    Social History   Tobacco Use   Smoking status: Never   Smokeless tobacco: Never  Vaping Use   Vaping Use: Never used  Substance Use Topics   Alcohol use: No    Alcohol/week: 0.0 standard drinks of alcohol   Drug use: No   Current Outpatient Medications  Medication Sig Dispense Refill   fexofenadine-pseudoephedrine (ALLEGRA-D ALLERGY & CONGESTION) 180-240 MG 24 hr tablet Take 1 tablet by mouth daily.     meclizine (ANTIVERT) 25 MG tablet TAKE 1 TABLET BY MOUTH 3 TIMES DAILY AS NEEDED FOR UP TO 10 DAYS FOR DIZZINESS.     EPINEPHrine (AUVI-Q) 0.3 mg/0.3 mL IJ SOAJ injection Inject 0.3 mg into the muscle as needed for anaphylaxis. (Patient not taking: Reported on 05/15/2022) 2 each 1   No current facility-administered medications for this visit.   Allergies  Allergen  Reactions   Keflex [Cephalexin] Anaphylaxis and Rash   Penicillins Other (See Comments)    Childhood Has patient had a PCN reaction causing immediate rash, facial/tongue/throat swelling, SOB or lightheadedness with hypotension: uknown Has patient had a PCN reaction causing severe rash involving mucus membranes or skin necrosis: unknown Has patient had a PCN reaction that required hospitalization unknown Has patient had a PCN reaction occurring within the last 10 years: uknown If all of the above answers are "NO", then may proceed with Cephalosporin use.    Sulfa Antibiotics Other (See Comments)    Unknown. Childhood Allergy     Review of Systems: All systems reviewed and negative except where noted in HPI.   Lab Results  Component Value Date   WBC 6.2 06/24/2021   HGB 16.9 06/24/2021   HCT 49.1 06/24/2021   MCV 94.1 06/24/2021   PLT 194 06/24/2021    Lab Results  Component Value Date   ALT 69 (H) 02/21/2022   AST 36 02/21/2022   ALKPHOS 75 02/21/2022   BILITOT 1.4 (H) 02/21/2022     Physical Exam: BP 130/84   Pulse 100   Ht 6\' 2"  (1.88 m)   Wt 266 lb (120.7 kg)   BMI 34.15 kg/m  Constitutional: Pleasant,well-developed, male in no acute distress. Neurological: Alert and oriented to person place and time. Skin: Skin is warm and dry. No rashes noted. Psychiatric: Normal mood and affect. Behavior is normal.   ASSESSMENT: 41 y.o. male here for assessment of the following  1. Fatty liver   2. Food intolerance    Discussed fatty liver in general, risks for cirrhosis long-term which he is aware of.  He has been working really hard at rowing and routine exercise and has lost about 9 pounds since have last seen him.  His ALT has responded and is downtrending which is good.  We discussed portance of diet and weight loss which he has some ways to go but I think will really help him.  He is understanding of this.  Given the duration of how long he has had fatty liver with  his ALT elevation, I offered him an ultrasound with elastography to assess for fibrotic change noninvasively.  He is very interested in this however cannot schedule it now due to finances.  He may want to schedule this next year.  We will plan on repeating his LFTs in February.  Otherwise has some food intolerances since his bowel operations years ago, these are stable and he is doing well in this regard.  PLAN: - continue routine exercise, diet for weight loss, routine coffee intake - repeat LFTs in February 2024 - offered [place] elastography - he wants to pursue this but matter of timing with finances , likely next year - f/u yearly  Jolly Mango, MD [facility]

## 2022-05-15 NOTE — Patient Instructions (Addendum)
If you are age 41 or older, your body mass index should be between 23-30. Your Body mass index is 34.15 kg/m. If this is out of the aforementioned range listed, please consider follow up with your Primary Care Provider.  If you are age 80 or younger, your body mass index should be between 19-25. Your Body mass index is 34.15 kg/m. If this is out of the aformentioned range listed, please consider follow up with your Primary Care Provider.   ________________________________________________________   [name] will be due for labs in February 2024.  Thank you for entrusting me with your care and for choosing [facility], Dr. [place] Cellar

## 2022-07-01 ENCOUNTER — Encounter: Payer: Self-pay | Admitting: Family Medicine

## 2022-07-01 ENCOUNTER — Ambulatory Visit (INDEPENDENT_AMBULATORY_CARE_PROVIDER_SITE_OTHER): Payer: BC Managed Care – PPO | Admitting: Family Medicine

## 2022-07-01 VITALS — BP 126/82 | HR 70 | Ht 74.0 in | Wt 261.0 lb

## 2022-07-01 DIAGNOSIS — Z0000 Encounter for general adult medical examination without abnormal findings: Secondary | ICD-10-CM

## 2022-07-01 MED ORDER — TYPHOID VACCINE PO CPDR: 1.0000 | DELAYED_RELEASE_CAPSULE | ORAL | 0 refills | Status: AC

## 2022-07-01 NOTE — Progress Notes (Signed)
Subjective:    Patient ID: [name], male    DOB: [birth year], 41 y.o.   MRN: [id number]  HPI  Patient is a very pleasant 41 year old Caucasian gentleman here today for complete physical exam.  In February he is going to [place] on a missions trip.  Reviewing the CDC travel website recommends a typhoid vaccine.  I sent that prescription in for the patient and instructed him to take the last dose 1 week prior to travel.  Also recommended the COVID-vaccine.  Otherwise his immunizations are up-to-date.  He is not yet due for colonoscopy or prostate cancer screening.  His blood pressure is excellent.  He has had elevated liver function test presumed due to fatty liver disease. Past Medical History:  Diagnosis Date   Fatty liver    Legionella pneumonia (HCC)    2009   Past Surgical History:  Procedure Laterality Date   COLONOSCOPY WITH PROPOFOL N/A 04/15/2017   Procedure: COLONOSCOPY WITH PROPOFOL;  Surgeon: Benancio Deeds, MD;  Location: [facility];  Service: Gastroenterology;  Laterality: N/A;   HERNIA REPAIR     IR GENERIC HISTORICAL  09/09/2016   IR RADIOLOGIST EVAL & MGMT 09/09/2016 Berdine Dance, MD [facility]   IR RADIOLOGIST EVAL & MGMT  09/23/2016   KNEE ARTHROSCOPY Right    KNEE SURGERY Left    LAPAROSCOPIC SMALL BOWEL RESECTION N/A 10/16/2016   Procedure: LAPAROSCOPIC SMALL BOWEL RESECTION POSSIBLE EXPLORATORY LAPAROTOMY AND POSSIBLE OPEN WASH OUT ileocectomy;  Surgeon: Almond Lint, MD;  Location: [facility];  Service: General;  Laterality: N/A;   Current Outpatient Medications on File Prior to Visit  Medication Sig Dispense Refill   fexofenadine (ALLEGRA) 180 MG tablet Take 180 mg by mouth daily.     meclizine (ANTIVERT) 25 MG tablet TAKE 1 TABLET BY MOUTH 3 TIMES DAILY AS NEEDED FOR UP TO 10 DAYS FOR DIZZINESS.     No current facility-administered medications on file prior to visit.   Allergies  Allergen Reactions   Keflex [Cephalexin] Anaphylaxis and Rash    Penicillins Other (See Comments)    Childhood Has patient had a PCN reaction causing immediate rash, facial/tongue/throat swelling, SOB or lightheadedness with hypotension: uknown Has patient had a PCN reaction causing severe rash involving mucus membranes or skin necrosis: unknown Has patient had a PCN reaction that required hospitalization unknown Has patient had a PCN reaction occurring within the last 10 years: uknown If all of the above answers are "NO", then may proceed with Cephalosporin use.    Sulfa Antibiotics Other (See Comments)    Unknown. Childhood Allergy   Social History   Socioeconomic History   Marital status: Married    Spouse name: Not on file   Number of children: Not on file   Years of education: Not on file   Highest education level: Not on file  Occupational History   Not on file  Tobacco Use   Smoking status: Never   Smokeless tobacco: Never  Vaping Use   Vaping Use: Never used  Substance and Sexual Activity   Alcohol use: No    Alcohol/week: 0.0 standard drinks of alcohol   Drug use: No   Sexual activity: Yes    Comment: married, works at [employer]  Other Topics Concern   Not on file  Social History Narrative   Not on file   Social Determinants of Health   Financial Resource Strain: Not on file  Food Insecurity: Not on file  Transportation Needs: Not on  file  Physical Activity: Not on file  Stress: Not on file  Social Connections: Not on file  Intimate Partner Violence: Not on file    Review of Systems  All other systems reviewed and are negative.      Objective:   Physical Exam Vitals reviewed.  Constitutional:      General: He is not in acute distress.    Appearance: He is well-developed. He is not diaphoretic.  HENT:     Head: Normocephalic and atraumatic.     Right Ear: External ear normal.     Left Ear: External ear normal.     Nose: Nose normal.     Mouth/Throat:     Pharynx: No oropharyngeal exudate.  Eyes:      General: No scleral icterus.       Right eye: No discharge.        Left eye: No discharge.     Conjunctiva/sclera: Conjunctivae normal.     Pupils: Pupils are equal, round, and reactive to light.  Neck:     Thyroid: No thyromegaly.     Vascular: No JVD.     Trachea: No tracheal deviation.  Cardiovascular:     Rate and Rhythm: Normal rate and regular rhythm.     Heart sounds: Normal heart sounds. No murmur heard.    No friction rub. No gallop.  Pulmonary:     Effort: Pulmonary effort is normal. No respiratory distress.     Breath sounds: Normal breath sounds. No stridor. No wheezing or rales.  Chest:     Chest wall: No tenderness.  Abdominal:     General: Bowel sounds are normal. There is no distension.     Palpations: Abdomen is soft. There is no mass.     Tenderness: There is no abdominal tenderness. There is no guarding or rebound.  Musculoskeletal:        General: No tenderness or deformity. Normal range of motion.     Cervical back: Normal range of motion and neck supple.  Lymphadenopathy:     Cervical: No cervical adenopathy.  Skin:    General: Skin is warm.     Coloration: Skin is not pale.     Findings: No erythema or rash.  Neurological:     Mental Status: He is alert and oriented to person, place, and time.     Cranial Nerves: No cranial nerve deficit.     Motor: No abnormal muscle tone.     Coordination: Coordination normal.     Deep Tendon Reflexes: Reflexes are normal and symmetric. Reflexes normal.  Psychiatric:        Behavior: Behavior normal.        Thought Content: Thought content normal.        Judgment: Judgment normal.    Surgical scar in lower pelvis.       Assessment & Plan:  General medical exam - Plan: CBC with Differential/Platelet, Lipid panel, COMPLETE METABOLIC PANEL WITH GFR Physical exam is normal today aside from an elevated BMI.  Recommend diet exercise and weight loss.  Calculate fib 4.  If elevated would recommend GLP-1 agonist to  facilitate weight loss to help correct fatty liver disease.  Typhoid vaccine was sent in for the patient.  Recommended a COVID booster.

## 2022-07-02 LAB — CBC WITH DIFFERENTIAL/PLATELET
Absolute Monocytes: 411 cells/uL (ref 200–950)
Basophils Absolute: 42 cells/uL (ref 0–200)
Basophils Relative: 0.8 %
Eosinophils Absolute: 151 cells/uL (ref 15–500)
Eosinophils Relative: 2.9 %
HCT: 45.4 % (ref 38.5–50.0)
Hemoglobin: 16.2 g/dL (ref 13.2–17.1)
Lymphs Abs: 2226 cells/uL (ref 850–3900)
MCH: 33.1 pg — ABNORMAL HIGH (ref 27.0–33.0)
MCHC: 35.7 g/dL (ref 32.0–36.0)
MCV: 92.8 fL (ref 80.0–100.0)
MPV: 10.6 fL (ref 7.5–12.5)
Monocytes Relative: 7.9 %
Neutro Abs: 2371 cells/uL (ref 1500–7800)
Neutrophils Relative %: 45.6 %
Platelets: 186 10*3/uL (ref 140–400)
RBC: 4.89 10*6/uL (ref 4.20–5.80)
RDW: 12.4 % (ref 11.0–15.0)
Total Lymphocyte: 42.8 %
WBC: 5.2 10*3/uL (ref 3.8–10.8)

## 2022-07-02 LAB — COMPLETE METABOLIC PANEL WITH GFR
AG Ratio: 2.5 (calc) (ref 1.0–2.5)
ALT: 108 U/L — ABNORMAL HIGH (ref 9–46)
AST: 58 U/L — ABNORMAL HIGH (ref 10–40)
Albumin: 4.9 g/dL (ref 3.6–5.1)
Alkaline phosphatase (APISO): 56 U/L (ref 36–130)
BUN: 22 mg/dL (ref 7–25)
CO2: 25 mmol/L (ref 20–32)
Calcium: 10.1 mg/dL (ref 8.6–10.3)
Chloride: 106 mmol/L (ref 98–110)
Creat: 0.86 mg/dL (ref 0.60–1.29)
Globulin: 2 g/dL (calc) (ref 1.9–3.7)
Glucose, Bld: 94 mg/dL (ref 65–99)
Potassium: 4.6 mmol/L (ref 3.5–5.3)
Sodium: 140 mmol/L (ref 135–146)
Total Bilirubin: 3 mg/dL — ABNORMAL HIGH (ref 0.2–1.2)
Total Protein: 6.9 g/dL (ref 6.1–8.1)
eGFR: 112 mL/min/{1.73_m2} (ref >=60)

## 2022-07-02 LAB — LIPID PANEL
Cholesterol: 189 mg/dL (ref <200)
HDL: 44 mg/dL (ref >=40)
LDL Cholesterol (Calc): 115 mg/dL (calc) — ABNORMAL HIGH
Non-HDL Cholesterol (Calc): 145 mg/dL (calc) — ABNORMAL HIGH (ref <130)
Total CHOL/HDL Ratio: 4.3 (calc) (ref <5.0)
Triglycerides: 187 mg/dL — ABNORMAL HIGH (ref <150)

## 2022-08-13 ENCOUNTER — Telehealth: Payer: Self-pay

## 2022-08-13 NOTE — Telephone Encounter (Signed)
Pt states he has been unable to take the oral typhoid vaccine due to being sick since last visit. Pt states he just finished up his last antibiotic on Sunday 08/10/2022 and asks if it okay to take, since his cruise is coming up soon. Thank you.

## 2022-08-18 ENCOUNTER — Telehealth: Payer: Self-pay

## 2022-08-18 DIAGNOSIS — K760 Fatty (change of) liver, not elsewhere classified: Secondary | ICD-10-CM

## 2022-08-18 DIAGNOSIS — R748 Abnormal levels of other serum enzymes: Secondary | ICD-10-CM

## 2022-08-18 NOTE — Telephone Encounter (Signed)
-----  Message from Roetta Sessions, [place] sent at 05/15/2022  3:38 PM EDT ----- Regarding: labs due Feb Patient due for LFTs in Feb 2024 (patient indicated he may get at annual physical)

## 2022-08-18 NOTE — Telephone Encounter (Signed)
MyChart message to patient to go to lab for LFTs 

## 2022-08-21 NOTE — Telephone Encounter (Signed)
Called and spoke to patient. He is going on a trip but will try to get labs done tomorrow if he has time but other wise he will go in a couple of weeks when he returns.  He did get a CMET in Dec with Dr. Dennard Schaumann

## 2022-08-22 ENCOUNTER — Other Ambulatory Visit (INDEPENDENT_AMBULATORY_CARE_PROVIDER_SITE_OTHER): Payer: BC Managed Care – PPO

## 2022-08-22 DIAGNOSIS — K760 Fatty (change of) liver, not elsewhere classified: Secondary | ICD-10-CM

## 2022-08-22 DIAGNOSIS — R748 Abnormal levels of other serum enzymes: Secondary | ICD-10-CM

## 2022-08-22 LAB — HEPATIC FUNCTION PANEL
ALT: 109 U/L — ABNORMAL HIGH (ref 0–53)
AST: 50 U/L — ABNORMAL HIGH (ref 0–37)
Albumin: 4.6 g/dL (ref 3.5–5.2)
Alkaline Phosphatase: 60 U/L (ref 39–117)
Bilirubin, Direct: 0.3 mg/dL (ref 0.0–0.3)
Total Bilirubin: 2.4 mg/dL — ABNORMAL HIGH (ref 0.2–1.2)
Total Protein: 7 g/dL (ref 6.0–8.3)

## 2022-09-16 ENCOUNTER — Telehealth: Payer: Self-pay

## 2022-09-16 NOTE — Telephone Encounter (Signed)
-----   Message from [name], [place] sent at 08/22/2022  4:05 PM EST ----- Regarding: [place] and F/U Check to see if patient wants to proceed with [place] with elastography and follow up with Dr. Havery Moros

## 2022-09-16 NOTE — Telephone Encounter (Signed)
Called and spoke with patient to see if he wanted to proceed with [place] and f/u appt. Pt states that he is still working on his budget and has not forgotten about the ultrasound. Pt states that he will contact us once he is ready to proceed with [place] and he will schedule his f/u appt at that time.

## 2023-07-03 ENCOUNTER — Ambulatory Visit (INDEPENDENT_AMBULATORY_CARE_PROVIDER_SITE_OTHER): Payer: BC Managed Care – PPO | Admitting: Family Medicine

## 2023-07-03 ENCOUNTER — Encounter: Payer: Self-pay | Admitting: Family Medicine

## 2023-07-03 VITALS — BP 130/88 | HR 91 | Temp 98.2°F | Ht 74.0 in | Wt 260.2 lb

## 2023-07-03 DIAGNOSIS — Z0000 Encounter for general adult medical examination without abnormal findings: Secondary | ICD-10-CM

## 2023-07-03 NOTE — Progress Notes (Signed)
Subjective:    Patient ID: [name], male    DOB: Aug 20, 1980, 42 y.o.   MRN: [id number]  HPI  Patient is a very pleasant 42 year old Caucasian gentleman here today for complete physical exam.  He has had elevated liver function test presumed due to fatty liver disease.  In February, GI recommended elastography to evaluate for cirrhosis.  This did not happen.  The patient is working on diet at the present time.  Otherwise he is doing well with no concerns.  His flu shot is up-to-date. Past Medical History:  Diagnosis Date   Fatty liver    Legionella pneumonia (HCC)    2009   Past Surgical History:  Procedure Laterality Date   COLONOSCOPY WITH PROPOFOL N/A 04/15/2017   Procedure: COLONOSCOPY WITH PROPOFOL;  Surgeon: Benancio Deeds, MD;  [facility];  Service: Gastroenterology;  Laterality: N/A;   HERNIA REPAIR     IR GENERIC HISTORICAL  09/09/2016   IR RADIOLOGIST EVAL & MGMT 09/09/2016 Berdine Dance, MD [facility]   IR RADIOLOGIST EVAL & MGMT  09/23/2016   KNEE ARTHROSCOPY Right    KNEE SURGERY Left    LAPAROSCOPIC SMALL BOWEL RESECTION N/A 10/16/2016   Procedure: LAPAROSCOPIC SMALL BOWEL RESECTION POSSIBLE EXPLORATORY LAPAROTOMY AND POSSIBLE OPEN WASH OUT ileocectomy;  Surgeon: Almond Lint, MD;  Location: [facility];  Service: General;  Laterality: N/A;   Current Outpatient Medications on File Prior to Visit  Medication Sig Dispense Refill   fexofenadine (ALLEGRA) 180 MG tablet Take 180 mg by mouth daily.     meclizine (ANTIVERT) 25 MG tablet TAKE 1 TABLET BY MOUTH 3 TIMES DAILY AS NEEDED FOR UP TO 10 DAYS FOR DIZZINESS.     typhoid (VIVOTIF) DR capsule Take 1 capsule by mouth every other day. 4 capsule 0   No current facility-administered medications on file prior to visit.   Allergies  Allergen Reactions   Keflex [Cephalexin] Anaphylaxis and Rash   Penicillins Other (See Comments)    Childhood Has patient had a PCN reaction causing immediate rash,  facial/tongue/throat swelling, SOB or lightheadedness with hypotension: uknown Has patient had a PCN reaction causing severe rash involving mucus membranes or skin necrosis: unknown Has patient had a PCN reaction that required hospitalization unknown Has patient had a PCN reaction occurring within the last 10 years: uknown If all of the above answers are "NO", then may proceed with Cephalosporin use.    Sulfa Antibiotics Other (See Comments)    Unknown. Childhood Allergy   Social History   Socioeconomic History   Marital status: Married    Spouse name: Not on file   Number of children: Not on file   Years of education: Not on file   Highest education level: Not on file  Occupational History   Not on file  Tobacco Use   Smoking status: Never   Smokeless tobacco: Never  Vaping Use   Vaping status: Never Used  Substance and Sexual Activity   Alcohol use: No    Alcohol/week: 0.0 standard drinks of alcohol   Drug use: No   Sexual activity: Yes    Comment: married, works at [employer]  Other Topics Concern   Not on file  Social History Narrative   Not on file   Social Drivers of Health   Financial Resource Strain: Low Risk  (06/06/2022)   Received from [facility] visits prior to 09/20/2022., [facility] to 09[facility]20/2022[facility]  Overall Financial Resource Strain (CARDIA)    Difficulty of Paying Living Expenses: Not hard at all  Food Insecurity: No Food Insecurity (06/06/2022)   Received from [facility] visits prior to 09/20/2022., [facility] visits prior to 09/20/2022., [facility]   Hunger Vital Sign    Worried About Running Out of Food in the Last Year: Never true    Ran Out of Food in the Last Year: Never true  Transportation Needs: No Transportation Needs (06/06/2022)   [facility] prior to 09/20/2022., [facility] visits prior to 09/20/2022., [facility]   PRAPARE - Transportation    Lack of Transportation (Medical): No    Lack of Transportation (Non-Medical): No  Physical Activity: Sufficiently Active (06/06/2022)   [facility] prior to 09/20/2022., [facility] prior to 09/20/2022., [facility]   Exercise Vital Sign    Days of Exercise per Week: 6 days    Minutes of Exercise per Session: 40 min  Stress: No Stress Concern Present (06/06/2022)   [facility] prior to 09/20/2022., [facility] visits prior to 09/20/2022., [facility]   [employer] of Occupational Health - Occupational Stress Questionnaire    Feeling of Stress : Not at all  Social Connections: Unknown (06/06/2022)   [facility] prior to 09/20/2022., [facility]   Social Connection and Isolation Panel [NHANES]    Frequency of Communication with Friends and Family: More than three times a week    Frequency of Social Gatherings with Friends and Family: More than three times a week    Attends Religious Services: Never    Database administrator or Organizations: Not on file    Attends Banker Meetings: Not on file    Marital Status: Not on file  Intimate Partner Violence: Not At Risk (06/06/2022)   [facility] prior to 09/20/2022., [facility] visits prior to 09/20/2022.   Humiliation, Afraid, Rape, and Kick questionnaire    Fear of Current or Ex-Partner: No    Emotionally Abused: No    Physically Abused: No    Sexually Abused: No    Review of Systems  All other systems reviewed and are negative.      Objective:   Physical Exam Vitals reviewed.  Constitutional:      General: He is not in acute distress.    Appearance: He is well-developed. He is not diaphoretic.  HENT:      Head: Normocephalic and atraumatic.     Right Ear: External ear normal.     Left Ear: External ear normal.     Nose: Nose normal.     Mouth/Throat:     Pharynx: No oropharyngeal exudate.  Eyes:     General: No scleral icterus.       Right eye: No discharge.        Left eye: No discharge.     Conjunctiva/sclera: Conjunctivae normal.     Pupils: Pupils are equal, round, and reactive to light.  Neck:     Thyroid: No thyromegaly.     Vascular: No JVD.     Trachea: No tracheal deviation.  Cardiovascular:     Rate and Rhythm: Normal rate and regular rhythm.     Heart sounds:  Normal heart sounds. No murmur heard.    No friction rub. No gallop.  Pulmonary:     Effort: Pulmonary effort is normal. No respiratory distress.     Breath sounds: Normal breath sounds. No stridor. No wheezing or rales.  Chest:     Chest wall: No tenderness.  Abdominal:     General: Bowel sounds are normal. There is no distension.     Palpations: Abdomen is soft. There is no mass.     Tenderness: There is no abdominal tenderness. There is no guarding or rebound.  Musculoskeletal:        General: No tenderness or deformity. Normal range of motion.     Cervical back: Normal range of motion and neck supple.  Lymphadenopathy:     Cervical: No cervical adenopathy.  Skin:    General: Skin is warm.     Coloration: Skin is not pale.     Findings: No erythema or rash.  Neurological:     Mental Status: He is alert and oriented to person, place, and time.     Cranial Nerves: No cranial nerve deficit.     Motor: No abnormal muscle tone.     Coordination: Coordination normal.     Deep Tendon Reflexes: Reflexes are normal and symmetric. Reflexes normal.  Psychiatric:        Behavior: Behavior normal.        Thought Content: Thought content normal.        Judgment: Judgment normal.    Surgical scar in lower pelvis.       Assessment & Plan:  General medical exam - Plan: CBC with Differential/Platelet,  COMPLETE METABOLIC PANEL WITH GFR, Lipid panel Physical exam is normal today aside from an elevated BMI.  Recommend diet exercise and weight loss.  If liver function test are significantly elevated again, I would recommend adding semaglutide to facilitate weight loss.  Immunizations are up-to-date.  Cancer screening is not yet due.

## 2023-07-04 LAB — COMPLETE METABOLIC PANEL WITH GFR
AG Ratio: 2.1 (calc) (ref 1.0–2.5)
ALT: 108 U/L — ABNORMAL HIGH (ref 9–46)
AST: 56 U/L — ABNORMAL HIGH (ref 10–40)
Albumin: 4.7 g/dL (ref 3.6–5.1)
Alkaline phosphatase (APISO): 62 U/L (ref 36–130)
BUN: 18 mg/dL (ref 7–25)
CO2: 25 mmol/L (ref 20–32)
Calcium: 9.9 mg/dL (ref 8.6–10.3)
Chloride: 104 mmol/L (ref 98–110)
Creat: 0.92 mg/dL (ref 0.60–1.29)
Globulin: 2.2 g/dL (ref 1.9–3.7)
Glucose, Bld: 88 mg/dL (ref 65–99)
Potassium: 4.2 mmol/L (ref 3.5–5.3)
Sodium: 139 mmol/L (ref 135–146)
Total Bilirubin: 2.4 mg/dL — ABNORMAL HIGH (ref 0.2–1.2)
Total Protein: 6.9 g/dL (ref 6.1–8.1)
eGFR: 107 mL/min/{1.73_m2} (ref >=60)

## 2023-07-04 LAB — CBC WITH DIFFERENTIAL/PLATELET
Absolute Lymphocytes: 2204 {cells}/uL (ref 850–3900)
Absolute Monocytes: 470 {cells}/uL (ref 200–950)
Basophils Absolute: 41 {cells}/uL (ref 0–200)
Basophils Relative: 0.7 %
Eosinophils Absolute: 99 {cells}/uL (ref 15–500)
Eosinophils Relative: 1.7 %
HCT: 49.3 % (ref 38.5–50.0)
Hemoglobin: 16.5 g/dL (ref 13.2–17.1)
MCH: 31.7 pg (ref 27.0–33.0)
MCHC: 33.5 g/dL (ref 32.0–36.0)
MCV: 94.6 fL (ref 80.0–100.0)
MPV: 10.8 fL (ref 7.5–12.5)
Monocytes Relative: 8.1 %
Neutro Abs: 2987 {cells}/uL (ref 1500–7800)
Neutrophils Relative %: 51.5 %
Platelets: 209 10*3/uL (ref 140–400)
RBC: 5.21 10*6/uL (ref 4.20–5.80)
RDW: 12.2 % (ref 11.0–15.0)
Total Lymphocyte: 38 %
WBC: 5.8 10*3/uL (ref 3.8–10.8)

## 2023-07-04 LAB — LIPID PANEL
Cholesterol: 183 mg/dL (ref <200)
HDL: 44 mg/dL (ref >=40)
LDL Cholesterol (Calc): 119 mg/dL — ABNORMAL HIGH
Non-HDL Cholesterol (Calc): 139 mg/dL — ABNORMAL HIGH (ref <130)
Total CHOL/HDL Ratio: 4.2 (calc) (ref <5.0)
Triglycerides: 101 mg/dL (ref <150)

## 2023-07-07 ENCOUNTER — Ambulatory Visit: Payer: BC Managed Care – PPO | Admitting: Family Medicine

## 2023-07-09 ENCOUNTER — Telehealth: Payer: Self-pay | Admitting: Family Medicine

## 2023-07-09 DIAGNOSIS — Z0279 Encounter for issue of other medical certificate: Secondary | ICD-10-CM

## 2023-07-09 NOTE — Telephone Encounter (Signed)
Patient came to the office to drop off a physical form for the provider to complete and sign; made patient aware of the form completion fee we'll collect after the forms are complete.  Gave form to provider's nurse. Patient requesting to be called when paperwork completed.  Please advise at [phone].

## 2023-07-10 ENCOUNTER — Other Ambulatory Visit: Payer: Self-pay | Admitting: Family Medicine

## 2023-07-10 MED ORDER — WEGOVY 0.5 MG/0.5ML ~~LOC~~ SOAJ: 0.5000 mg | SUBCUTANEOUS | 1 refills | Status: DC

## 2023-07-10 MED ORDER — WEGOVY 0.5 MG/0.5ML ~~LOC~~ SOAJ: 0.5000 mg | SUBCUTANEOUS | 1 refills | Status: AC

## 2023-07-13 ENCOUNTER — Telehealth: Payer: Self-pay

## 2023-07-13 DIAGNOSIS — R634 Abnormal weight loss: Secondary | ICD-10-CM | POA: Insufficient documentation

## 2023-07-13 NOTE — Telephone Encounter (Signed)
PA-Wegovy sent to plan

## 2023-07-17 NOTE — Telephone Encounter (Signed)
Your prior authorization for [facility] has been approved!  Personalized support and financial assistance may be available through the [facility] [employer] program. For more information, and to see program requirements, click on the More Info button to the right.  Message from plan: Approved. . Authorization Expiration Date: November 16, 2023.

## 2023-07-28 NOTE — Telephone Encounter (Signed)
 Patient paid $10 form completion fee in full and picked up original paperwork.   Successfully faxed paperwork and copy of payment receipt to [facility] Admin with a confirmation time stamp of 07-28-2023 15:15.

## 2023-12-15 ENCOUNTER — Emergency Department (HOSPITAL_COMMUNITY)

## 2023-12-15 ENCOUNTER — Emergency Department (HOSPITAL_COMMUNITY)
Admission: EM | Admit: 2023-12-15 | Discharge: 2023-12-15 | Disposition: A | Discharge status: Home / Self Care | Attending: Emergency Medicine | Admitting: Emergency Medicine

## 2023-12-15 DIAGNOSIS — N132 Hydronephrosis with renal and ureteral calculous obstruction: Secondary | ICD-10-CM | POA: Insufficient documentation

## 2023-12-15 DIAGNOSIS — N200 Calculus of kidney: Secondary | ICD-10-CM

## 2023-12-15 DIAGNOSIS — R109 Unspecified abdominal pain: Secondary | ICD-10-CM | POA: Diagnosis present

## 2023-12-15 LAB — COMPREHENSIVE METABOLIC PANEL WITH GFR
ALT: 77 U/L — ABNORMAL HIGH (ref 0–44)
AST: 51 U/L — ABNORMAL HIGH (ref 15–41)
Albumin: 4.6 g/dL (ref 3.5–5.0)
Alkaline Phosphatase: 70 U/L (ref 38–126)
Anion gap: 14 (ref 5–15)
BUN: 21 mg/dL — ABNORMAL HIGH (ref 6–20)
CO2: 16 mmol/L — ABNORMAL LOW (ref 22–32)
Calcium: 9.6 mg/dL (ref 8.9–10.3)
Chloride: 102 mmol/L (ref 98–111)
Creatinine, Ser: 1 mg/dL (ref 0.61–1.24)
GFR, Estimated: >60 mL/min (ref >60)
Glucose, Bld: 166 mg/dL — ABNORMAL HIGH (ref 70–99)
Potassium: 3.3 mmol/L — ABNORMAL LOW (ref 3.5–5.1)
Sodium: 132 mmol/L — ABNORMAL LOW (ref 135–145)
Total Bilirubin: 1.4 mg/dL — ABNORMAL HIGH (ref 0.0–1.2)
Total Protein: 7.7 g/dL (ref 6.5–8.1)

## 2023-12-15 LAB — CBC WITH DIFFERENTIAL/PLATELET
Abs Immature Granulocytes: 0.05 10*3/uL (ref 0.00–0.07)
Basophils Absolute: 0.1 10*3/uL (ref 0.0–0.1)
Basophils Relative: 1 %
Eosinophils Absolute: 0.1 10*3/uL (ref 0.0–0.5)
Eosinophils Relative: 1 %
HCT: 46.5 % (ref 39.0–52.0)
Hemoglobin: 16.6 g/dL (ref 13.0–17.0)
Immature Granulocytes: 1 %
Lymphocytes Relative: 20 %
Lymphs Abs: 2.1 10*3/uL (ref 0.7–4.0)
MCH: 32.6 pg (ref 26.0–34.0)
MCHC: 35.7 g/dL (ref 30.0–36.0)
MCV: 91.4 fL (ref 80.0–100.0)
Monocytes Absolute: 0.7 10*3/uL (ref 0.1–1.0)
Monocytes Relative: 7 %
Neutro Abs: 7.5 10*3/uL (ref 1.7–7.7)
Neutrophils Relative %: 70 %
Platelets: 203 10*3/uL (ref 150–400)
RBC: 5.09 MIL/uL (ref 4.22–5.81)
RDW: 11.5 % (ref 11.5–15.5)
WBC: 10.5 10*3/uL (ref 4.0–10.5)
nRBC: 0 % (ref 0.0–0.2)

## 2023-12-15 LAB — URINALYSIS, ROUTINE W REFLEX MICROSCOPIC
Bacteria, UA: NONE SEEN
Bilirubin Urine: NEGATIVE
Glucose, UA: NEGATIVE mg/dL
Ketones, ur: 20 mg/dL — AB
Leukocytes,Ua: NEGATIVE
Nitrite: NEGATIVE
Protein, ur: NEGATIVE mg/dL
Specific Gravity, Urine: 1.019 (ref 1.005–1.030)
pH: 6 (ref 5.0–8.0)

## 2023-12-15 MED ORDER — KETOROLAC TROMETHAMINE 15 MG/ML IJ SOLN
15.0000 mg | Freq: Once | INTRAMUSCULAR | Status: AC
  Administered 2023-12-15: 15 mg via INTRAVENOUS
  Filled 2023-12-15: qty 1

## 2023-12-15 MED ORDER — MORPHINE SULFATE (PF) 4 MG/ML IV SOLN
4.0000 mg | Freq: Once | INTRAVENOUS | Status: AC
  Administered 2023-12-15: 4 mg via INTRAVENOUS
  Filled 2023-12-15: qty 1

## 2023-12-15 MED ORDER — LACTATED RINGERS IV BOLUS
1000.0000 mL | Freq: Once | INTRAVENOUS | Status: AC
  Administered 2023-12-15: 1000 mL via INTRAVENOUS

## 2023-12-15 MED ORDER — OXYCODONE-ACETAMINOPHEN 5-325 MG PO TABS
2.0000 | ORAL_TABLET | Freq: Once | ORAL | Status: AC
  Administered 2023-12-15: 2 via ORAL
  Filled 2023-12-15: qty 2

## 2023-12-15 MED ORDER — OXYCODONE-ACETAMINOPHEN 5-325 MG PO TABS: 1.0000 | ORAL_TABLET | Freq: Four times a day (QID) | ORAL | 0 refills | Status: AC | PRN

## 2023-12-15 MED ORDER — ONDANSETRON 8 MG PO TBDP: 8.0000 mg | ORAL_TABLET | Freq: Three times a day (TID) | ORAL | 0 refills | Status: AC | PRN

## 2023-12-15 MED ORDER — KETOROLAC TROMETHAMINE 30 MG/ML IJ SOLN
30.0000 mg | Freq: Once | INTRAMUSCULAR | Status: AC
  Administered 2023-12-15: 30 mg via INTRAMUSCULAR
  Filled 2023-12-15: qty 1

## 2023-12-15 NOTE — ED Triage Notes (Signed)
 Patient arrived with complaints of right sided flank pain, states it started as just feeling sore and became worse tonight. Declines any urinary symptoms, some NV.

## 2023-12-15 NOTE — ED Provider Notes (Signed)
 Patient treated for pain here and had a CT scan which showed a 1 mm right distal stone with mild hydro.  Patient's pain is controlled.  Will discharge home   Lind Repine, MD 12/15/23 [phone]

## 2023-12-15 NOTE — ED Provider Notes (Signed)
 [place] EMERGENC[facility] DEPARTMENT AT [facility] Provider Note  CSN: [id number] Arrival date & time: 12/15/23 0431  Chief Complaint(s) Flank Pain  HPI Helder Crisafulli is a 43 y.o. male who presents emergency room for evaluation of right flank pain.  States that symptoms began early this morning with associated nausea.  Severe flank pain that radiates into the groin.  Denies dysuria, chest pain, shortness of breath, headache, fever or other systemic symptoms.  No previous history of nephrolithiasis.   Past Medical History Past Medical History:  Diagnosis Date   Fatty liver    Legionella pneumonia (HCC)    2009   Patient Active Problem List   Diagnosis Date Noted   Weight loss 07/13/2023   Dizziness 08/05/2018   Seasonal and perennial allergic rhinitis 06/04/2017   Adverse food reaction 06/04/2017   History of small bowel obstruction 06/04/2017   Abnormal CT of the abdomen    Abdominal pain    Sepsis (HCC) 04/06/2017   Colitis 04/06/2017   Pelvic abscess in male [facility]) 10/12/2016   SBO (small bowel obstruction) (HCC) 08/21/2016   Small bowel obstruction (HCC) 08/20/2016   Home Medication(s) Prior to Admission medications   Medication Sig Start Date End Date Taking? Authorizing Provider  fexofenadine (ALLEGRA) 180 MG tablet Take 180 mg by mouth daily.    [provider]  meclizine (ANTIVERT) 25 MG tablet TAKE 1 TABLET BY MOUTH 3 TIMES DAILY AS NEEDED FOR UP TO 10 DAYS FOR DIZZINESS. 07/12/18   [provider]  Semaglutide -Weight Management (WEGOVY ) 0.5 MG/0.5ML SOAJ Inject 0.5 mg into the skin once a week. 07/10/23   Austine Lefort, MD  typhoid (VIVOTIF) DR capsule Take 1 capsule by mouth every other day. 07/01/22   Austine Lefort, MD                                                                                                                                    Past Surgical History Past Surgical History:  Procedure Laterality Date    COLONOSCOPY WITH PROPOFOL  N/A 04/15/2017   Procedure: COLONOSCOPY WITH PROPOFOL ;  Surgeon: Ace Holder, MD;  Location: [facility] ENDOSCOPY;  Service: Gastroenterology;  Laterality: N/A;   HERNIA REPAIR     IR GENERIC HISTORICAL  09/09/2016   IR RADIOLOGIST EVAL & MGMT 09/09/2016 Lucinda Saber, MD [facility] INTERV RAD   IR RADIOLOGIST EVAL & MGMT  09/23/2016   KNEE ARTHROSCOPY Right    KNEE SURGERY Left    LAPAROSCOPIC SMALL BOWEL RESECTION N/A 10/16/2016   Procedure: LAPAROSCOPIC SMALL BOWEL RESECTION POSSIBLE EXPLORATORY LAPAROTOMY AND POSSIBLE OPEN WASH OUT ileocectomy;  Surgeon: Lockie Rima, MD;  Location: [facility] ORS;  Service: General;  Laterality: N/A;   Family History Family History  Problem Relation Age of Onset   Mental illness Mother    Allergic rhinitis Mother    Diabetes Father    Cancer Paternal Grandfather  oral cancer   Angioedema Neg Hx    Asthma Neg Hx    Eczema Neg Hx    Colon cancer Neg Hx    Esophageal cancer Neg Hx    Stomach cancer Neg Hx    Pancreatic cancer Neg Hx    Liver disease Neg Hx     Social History Social History   Tobacco Use   Smoking status: Never   Smokeless tobacco: Never  Vaping Use   Vaping status: Never Used  Substance Use Topics   Alcohol use: No    Alcohol/week: 0.0 standard drinks of alcohol   Drug use: No   Allergies Keflex [cephalexin], Penicillins, and Sulfa antibiotics  Review of Systems Review of Systems  Gastrointestinal:  Positive for nausea.  Genitourinary:  Positive for flank pain.    Physical Exam Vital Signs  I have reviewed the triage vital signs BP (!) 153/76 (BP Location: Right Arm)   Pulse 65   Resp 20   SpO2 100%   Physical Exam Constitutional:      General: He is not in acute distress.    Appearance: Normal appearance.  HENT:     Head: Normocephalic and atraumatic.     Nose: No congestion or rhinorrhea.  Eyes:     General:        Right eye: No discharge.        Left eye: No discharge.      Extraocular Movements: Extraocular movements intact.     Pupils: Pupils are equal, round, and reactive to light.  Cardiovascular:     Rate and Rhythm: Normal rate and regular rhythm.     Heart sounds: No murmur heard. Pulmonary:     Effort: No respiratory distress.     Breath sounds: No wheezing or rales.  Abdominal:     General: There is no distension.     Tenderness: There is no abdominal tenderness. There is right CVA tenderness.  Musculoskeletal:        General: Normal range of motion.     Cervical back: Normal range of motion.  Skin:    General: Skin is warm and dry.  Neurological:     General: No focal deficit present.     Mental Status: He is alert.     ED Results and Treatments Labs (all labs ordered are listed, but only abnormal results are displayed) Labs Reviewed  COMPREHENSIVE METABOLIC PANEL WITH GFR  CBC WITH DIFFERENTIAL/PLATELET  URINALYSIS, ROUTINE W REFLEX MICROSCOPIC                                                                                                                          Radiology No results found.  Pertinent labs & imaging results that were available during my care of the patient were reviewed by me and considered in my medical decision making (see MDM for details).  Medications Ordered in ED Medications  morphine  (PF) 4 MG/ML injection 4 mg (has no administration in time  range)  ketorolac  (TORADOL ) 15 MG/ML injection 15 mg (has no administration in time range)  lactated ringers  bolus 1,000 mL (has no administration in time range)                                                                                                                                     Procedures Procedures  (including critical care time)  Medical Decision Making / ED Course   This patient presents to the ED for concern of flank pain, this involves an extensive number of treatment options, and is a complaint that carries with it a high risk of complications  and morbidity.  The differential diagnosis includes nephrolithiasis, pyelonephritis, obstruction, AAA, musculoskeletal strain, vertebral fracture, intra-abdominal abscess, diverticulitis  MDM: Patient seen emergency room for evaluation of flank pain.  Physical exam reveals an uncomfortable appearing patient with right CVA tenderness but is otherwise unremarkable.  Laboratory evaluation with a potassium of 3.3, CO2 16 but is otherwise unremarkable.  Wet read showing a distal nephrolithiasis on the right but official radiology read is pending.  At time of signout, patient also pending urinalysis.  Please see provider signout note for continuation of workup.   Additional history obtained: -Additional history obtained from father -External records from outside source obtained and reviewed including: Chart review including previous notes, labs, imaging, consultation notes   Lab Tests: -I ordered, reviewed, and interpreted labs.   The pertinent results include:   Labs Reviewed  COMPREHENSIVE METABOLIC PANEL WITH GFR  CBC WITH DIFFERENTIAL/PLATELET  URINALYSIS, ROUTINE W REFLEX MICROSCOPIC     Imaging Studies ordered: I ordered imaging studies including CT stone study I independently visualized and interpreted imaging. I agree with the radiologist interpretation   Medicines ordered and prescription drug management: Meds ordered this encounter  Medications   morphine  (PF) 4 MG/ML injection 4 mg   ketorolac  (TORADOL ) 15 MG/ML injection 15 mg   lactated ringers  bolus 1,000 mL    -I have reviewed the patients home medicines and have made adjustments as needed  Critical interventions none   Cardiac Monitoring: The patient was maintained on a cardiac monitor.  I personally viewed and interpreted the cardiac monitored which showed an underlying rhythm of: NSR  Social Determinants of Health:  Factors impacting patients care include: none   Reevaluation: After the interventions noted  above, I reevaluated the patient and found that they have :improved  Co morbidities that complicate the patient evaluation  Past Medical History:  Diagnosis Date   Fatty liver    Legionella pneumonia (HCC)    2009      Dispostion: I considered admission for this patient, and disposition pending completion of laboratory evaluation and imaging studies.  Please see provider signout note for continuation of workup.     Final Clinical Impression(s) / ED Diagnoses Final diagnoses:  None     @PCDICTATION @    Karlyn Overman, MD 12/15/23 [phone]
